# Patient Record
Sex: Female | Born: 1975 | Race: White | Hispanic: No | Marital: Married | Smoking: Former smoker
Health system: Southern US, Community
[De-identification: ages and names within clinical notes are randomized; demographics above are authoritative.]

## PROBLEM LIST (undated history)

## (undated) DIAGNOSIS — F32A Depression, unspecified: Secondary | ICD-10-CM

## (undated) DIAGNOSIS — F419 Anxiety disorder, unspecified: Secondary | ICD-10-CM

## (undated) DIAGNOSIS — Z9889 Other specified postprocedural states: Secondary | ICD-10-CM

## (undated) DIAGNOSIS — R112 Nausea with vomiting, unspecified: Secondary | ICD-10-CM

## (undated) DIAGNOSIS — I10 Essential (primary) hypertension: Secondary | ICD-10-CM

## (undated) DIAGNOSIS — H269 Unspecified cataract: Secondary | ICD-10-CM

## (undated) DIAGNOSIS — E079 Disorder of thyroid, unspecified: Secondary | ICD-10-CM

## (undated) DIAGNOSIS — D649 Anemia, unspecified: Secondary | ICD-10-CM

## (undated) DIAGNOSIS — G4730 Sleep apnea, unspecified: Secondary | ICD-10-CM

## (undated) DIAGNOSIS — Z5189 Encounter for other specified aftercare: Secondary | ICD-10-CM

## (undated) DIAGNOSIS — K219 Gastro-esophageal reflux disease without esophagitis: Secondary | ICD-10-CM

## (undated) DIAGNOSIS — Z8489 Family history of other specified conditions: Secondary | ICD-10-CM

## (undated) DIAGNOSIS — F329 Major depressive disorder, single episode, unspecified: Secondary | ICD-10-CM

## (undated) HISTORY — DX: Anxiety disorder, unspecified: F41.9

## (undated) HISTORY — DX: Essential (primary) hypertension: I10

## (undated) HISTORY — DX: Disorder of thyroid, unspecified: E07.9

## (undated) HISTORY — DX: Encounter for other specified aftercare: Z51.89

## (undated) HISTORY — DX: Unspecified cataract: H26.9

---

## 2000-11-15 ENCOUNTER — Other Ambulatory Visit: Admission: RE | Admit: 2000-11-15 | Discharge: 2000-11-15 | Payer: Self-pay | Admitting: Obstetrics and Gynecology

## 2000-12-16 ENCOUNTER — Emergency Department (HOSPITAL_COMMUNITY): Admission: EM | Admit: 2000-12-16 | Discharge: 2000-12-17 | Payer: Self-pay | Admitting: *Deleted

## 2001-03-20 ENCOUNTER — Ambulatory Visit (HOSPITAL_COMMUNITY): Admission: RE | Admit: 2001-03-20 | Discharge: 2001-03-20 | Payer: Self-pay | Admitting: Internal Medicine

## 2001-03-20 ENCOUNTER — Encounter: Payer: Self-pay | Admitting: Internal Medicine

## 2001-10-21 ENCOUNTER — Emergency Department (HOSPITAL_COMMUNITY): Admission: EM | Admit: 2001-10-21 | Discharge: 2001-10-21 | Payer: Self-pay | Admitting: Internal Medicine

## 2002-05-16 HISTORY — PX: TUBAL LIGATION: SHX77

## 2014-05-28 ENCOUNTER — Telehealth: Payer: Self-pay | Admitting: Family Medicine

## 2014-05-28 NOTE — Telephone Encounter (Signed)
Pt was being seen at health dept but now has [place] and [facility] as a new pt to establish care and get work physical. Pt given appt with Dr.Stacks 06/18/14 @ 10:55 and advised to arrive 15 minutes prior to fill out forms and to bring insurance card and all current meds which the pt states she doesn't have any chronic health problems or take meds on a regular basis.

## 2014-06-18 ENCOUNTER — Encounter: Payer: Self-pay | Admitting: Family Medicine

## 2014-06-18 ENCOUNTER — Ambulatory Visit (INDEPENDENT_AMBULATORY_CARE_PROVIDER_SITE_OTHER): Payer: BC Managed Care – PPO | Admitting: Family Medicine

## 2014-06-18 ENCOUNTER — Encounter (INDEPENDENT_AMBULATORY_CARE_PROVIDER_SITE_OTHER): Payer: Self-pay

## 2014-06-18 VITALS — BP 149/91 | HR 78 | Temp 99.0°F | Ht 66.0 in | Wt 286.0 lb

## 2014-06-18 DIAGNOSIS — Z111 Encounter for screening for respiratory tuberculosis: Secondary | ICD-10-CM

## 2014-06-18 DIAGNOSIS — Z139 Encounter for screening, unspecified: Secondary | ICD-10-CM

## 2014-06-18 DIAGNOSIS — Z23 Encounter for immunization: Secondary | ICD-10-CM

## 2014-06-18 DIAGNOSIS — Z021 Encounter for pre-employment examination: Secondary | ICD-10-CM

## 2014-06-18 NOTE — Patient Instructions (Signed)
DASH Eating Plan °DASH stands for "Dietary Approaches to Stop Hypertension." The DASH eating plan is a healthy eating plan that has been shown to reduce high blood pressure (hypertension). Additional health benefits may include reducing the risk of type 2 diabetes mellitus, heart disease, and stroke. The DASH eating plan may also help with weight loss. °WHAT DO I NEED TO KNOW ABOUT THE DASH EATING PLAN? °For the DASH eating plan, you will follow these general guidelines: °· Choose foods with a percent daily value for sodium of less than 5% (as listed on the food label). °· Use salt-free seasonings or herbs instead of table salt or sea salt. °· Check with your health care provider or pharmacist before using salt substitutes. °· Eat lower-sodium products, often labeled as "lower sodium" or "no salt added." °· Eat fresh foods. °· Eat more vegetables, fruits, and low-fat dairy products. °· Choose whole grains. Look for the word "whole" as the first word in the ingredient list. °· Choose fish and skinless chicken or turkey more often than red meat. Limit fish, poultry, and meat to 6 oz (170 g) each day. °· Limit sweets, desserts, sugars, and sugary drinks. °· Choose heart-healthy fats. °· Limit cheese to 1 oz (28 g) per day. °· Eat more home-cooked food and less restaurant, buffet, and fast food. °· Limit fried foods. °· Cook foods using methods other than frying. °· Limit canned vegetables. If you do use them, rinse them well to decrease the sodium. °· When eating at a restaurant, ask that your food be prepared with less salt, or no salt if possible. °WHAT FOODS CAN I EAT? °Seek help from a dietitian for individual calorie needs. °Grains °Whole grain or whole wheat bread. Brown rice. Whole grain or whole wheat pasta. Quinoa, bulgur, and whole grain cereals. Low-sodium cereals. Corn or whole wheat flour tortillas. Whole grain cornbread. Whole grain crackers. Low-sodium crackers. °Vegetables °Fresh or frozen vegetables  (raw, steamed, roasted, or grilled). Low-sodium or reduced-sodium tomato and vegetable juices. Low-sodium or reduced-sodium tomato sauce and paste. Low-sodium or reduced-sodium canned vegetables.  °Fruits °All fresh, canned (in natural juice), or frozen fruits. °Meat and Other Protein Products °Ground beef (85% or leaner), grass-fed beef, or beef trimmed of fat. Skinless chicken or turkey. Ground chicken or turkey. Pork trimmed of fat. All fish and seafood. Eggs. Dried beans, peas, or lentils. Unsalted nuts and seeds. Unsalted canned beans. °Dairy °Low-fat dairy products, such as skim or 1% milk, 2% or reduced-fat cheeses, low-fat ricotta or cottage cheese, or plain low-fat yogurt. Low-sodium or reduced-sodium cheeses. °Fats and Oils °Tub margarines without trans fats. Light or reduced-fat mayonnaise and salad dressings (reduced sodium). Avocado. Safflower, olive, or canola oils. Natural peanut or almond butter. °Other °Unsalted popcorn and pretzels. °The items listed above may not be a complete list of recommended foods or beverages. Contact your dietitian for more options. °WHAT FOODS ARE NOT RECOMMENDED? °Grains °White bread. White pasta. White rice. Refined cornbread. Bagels and croissants. Crackers that contain trans fat. °Vegetables °Creamed or fried vegetables. Vegetables in a cheese sauce. Regular canned vegetables. Regular canned tomato sauce and paste. Regular tomato and vegetable juices. °Fruits °Dried fruits. Canned fruit in light or heavy syrup. Fruit juice. °Meat and Other Protein Products °Fatty cuts of meat. Ribs, chicken wings, bacon, sausage, bologna, salami, chitterlings, fatback, hot dogs, bratwurst, and packaged luncheon meats. Salted nuts and seeds. Canned beans with salt. °Dairy °Whole or 2% milk, cream, half-and-half, and cream cheese. Whole-fat or sweetened yogurt. Full-fat   cheeses or blue cheese. Nondairy creamers and whipped toppings. Processed cheese, cheese spreads, or cheese  curds. °Condiments °Onion and garlic salt, seasoned salt, table salt, and sea salt. Canned and packaged gravies. Worcestershire sauce. Tartar sauce. Barbecue sauce. Teriyaki sauce. Soy sauce, including reduced sodium. Steak sauce. Fish sauce. Oyster sauce. Cocktail sauce. Horseradish. Ketchup and mustard. Meat flavorings and tenderizers. Bouillon cubes. Hot sauce. Tabasco sauce. Marinades. Taco seasonings. Relishes. °Fats and Oils °Butter, stick margarine, lard, shortening, ghee, and bacon fat. Coconut, palm kernel, or palm oils. Regular salad dressings. °Other °Pickles and olives. Salted popcorn and pretzels. °The items listed above may not be a complete list of foods and beverages to avoid. Contact your dietitian for more information. °WHERE CAN I FIND MORE INFORMATION? °[employer], [employer], [employer]: www.nhlbi.nih.gov/health/health-topics/topics/dash/ °Document Released: 04/21/2011 Document Revised: 09/16/2013 Document Reviewed: 03/06/2013 °Exit[employer]® Patient Information ©2015 [employer]. This information is not intended to replace advice given to you by your health care provider. Make sure you discuss any questions you have with your health care provider. ° °

## 2014-06-18 NOTE — Progress Notes (Signed)
   Subjective:    Patient ID: Heather Hardin, female    DOB: 1976-01-06, 39 y.o.   MRN: [id number]  HPI  Patient is here today to establish care and she also needs a PE for [facility].        Review of Systems  Constitutional: Negative for fever, chills, diaphoresis, appetite change, fatigue and unexpected weight change.  HENT: Negative for congestion, ear pain, hearing loss, postnasal drip, rhinorrhea, sneezing, sore throat and trouble swallowing.   Eyes: Negative for pain.  Respiratory: Negative for cough, chest tightness and shortness of breath.   Cardiovascular: Negative for chest pain and palpitations.  Gastrointestinal: Negative for nausea, vomiting, abdominal pain, diarrhea and constipation.  Genitourinary: Negative for dysuria, frequency and menstrual problem.  Musculoskeletal: Negative for joint swelling and arthralgias.  Skin: Negative for rash.  Neurological: Negative for dizziness, weakness, numbness and headaches.  Psychiatric/Behavioral: Negative for dysphoric mood and agitation.       Objective:   Physical Exam  Constitutional: She is oriented to person, place, and time. She appears well-developed and well-nourished. No distress.  HENT:  Head: Normocephalic and atraumatic.  Right Ear: External ear normal.  Left Ear: External ear normal.  Nose: Nose normal.  Mouth/Throat: Oropharynx is clear and moist.  Eyes: Conjunctivae and EOM are normal. Pupils are equal, round, and reactive to light.  Neck: Normal range of motion. Neck supple. No thyromegaly present.  Cardiovascular: Normal rate, regular rhythm and normal heart sounds.   No murmur heard. Pulmonary/Chest: Effort normal and breath sounds normal. No respiratory distress. She has no wheezes. She has no rales.  Abdominal: Soft. Bowel sounds are normal. She exhibits no distension. There is no tenderness.  Lymphadenopathy:    She has no cervical adenopathy.  Neurological: She is alert and  oriented to person, place, and time. She has normal reflexes.  Skin: Skin is warm and dry.  Psychiatric: She has a normal mood and affect. Her behavior is normal. Judgment and thought content normal.   BP 149/91 mmHg  Pulse 78  Temp(Src) 99 F (37.2 C) (Oral)  Ht 5\' 6"  (1.676 m)  Wt 286 lb (129.729 kg)  BMI 46.18 kg/m2  LMP 05/23/2014        Assessment & Plan:   1. Screening     No orders of the defined types were placed in this encounter.    Orders Placed This Encounter  Procedures  . Tdap vaccine greater than or equal to 7yo IM  . PPD    Order Specific Question:  Has patient ever tested positive?    Answer:  No    Labs pending Health Maintenance reviewed Diet and exercise encouraged Continue all meds as discussed Follow up prn Claretta Fraise, MD

## 2014-06-20 LAB — TB SKIN TEST
Induration: 0 mm
TB Skin Test: NEGATIVE

## 2014-07-18 ENCOUNTER — Other Ambulatory Visit: Payer: BC Managed Care – PPO | Admitting: Family

## 2014-08-22 ENCOUNTER — Ambulatory Visit (INDEPENDENT_AMBULATORY_CARE_PROVIDER_SITE_OTHER): Payer: BC Managed Care – PPO | Admitting: Family

## 2014-08-22 ENCOUNTER — Encounter: Payer: Self-pay | Admitting: Family

## 2014-08-22 VITALS — BP 162/101 | HR 80 | Temp 98.8°F | Ht 66.0 in | Wt 289.0 lb

## 2014-08-22 DIAGNOSIS — Z0000 Encounter for general adult medical examination without abnormal findings: Secondary | ICD-10-CM

## 2014-08-22 DIAGNOSIS — Z01419 Encounter for gynecological examination (general) (routine) without abnormal findings: Secondary | ICD-10-CM

## 2014-08-22 DIAGNOSIS — I10 Essential (primary) hypertension: Secondary | ICD-10-CM

## 2014-08-22 LAB — POCT UA - MICROSCOPIC ONLY
Bacteria, U Microscopic: NEGATIVE
Casts, Ur, LPF, POC: NEGATIVE
Crystals, Ur, HPF, POC: NEGATIVE
Yeast, UA: NEGATIVE

## 2014-08-22 LAB — POCT URINALYSIS DIPSTICK
Bilirubin, UA: NEGATIVE
Glucose, UA: NEGATIVE
Ketones, UA: NEGATIVE
Leukocytes, UA: NEGATIVE
Nitrite, UA: NEGATIVE
Protein, UA: NEGATIVE
Spec Grav, UA: 1.025
Urobilinogen, UA: NEGATIVE
pH, UA: 6

## 2014-08-22 MED ORDER — LISINOPRIL 20 MG PO TABS: 20.0000 mg | ORAL_TABLET | Freq: Every day | ORAL | Status: DC

## 2014-08-22 NOTE — Progress Notes (Signed)
Subjective:    Patient ID: Heather Hardin, female    DOB: 1975-08-13, 39 y.o.   MRN: [id number]  Pt presents to the office today for CPE with pap. Pt's BP is elevated today. Pt states she is under a great deal of stress r/t to family and her husband. Pt currently not taking any for BP. Gynecologic Exam Pertinent negatives include no headaches.  Hypertension This is a new problem. The current episode started today. The problem has been waxing and waning since onset. The problem is uncontrolled. Associated symptoms include peripheral edema (At times). Pertinent negatives include no anxiety, headaches, palpitations or shortness of breath. Risk factors for coronary artery disease include family history and obesity. Past treatments include nothing. The current treatment provides no improvement. There is no history of kidney disease, CAD/MI, CVA, heart failure or a thyroid problem. There is no history of sleep apnea.      Review of Systems  Constitutional: Negative.   HENT: Negative.   Eyes: Negative.   Respiratory: Negative.  Negative for shortness of breath.   Cardiovascular: Negative.  Negative for palpitations.  Gastrointestinal: Negative.   Endocrine: Negative.   Genitourinary: Negative.   Musculoskeletal: Negative.   Neurological: Negative.  Negative for headaches.  Hematological: Negative.   Psychiatric/Behavioral: Negative.   All other systems reviewed and are negative.      Objective:   Physical Exam  Constitutional: She is oriented to person, place, and time. She appears well-developed and well-nourished. No distress.  HENT:  Head: Normocephalic and atraumatic.  Right Ear: External ear normal.  Left Ear: External ear normal.  Nose: Nose normal.  Mouth/Throat: Oropharynx is clear and moist.  Eyes: Pupils are equal, round, and reactive to light.  Neck: Normal range of motion. Neck supple. No thyromegaly present.  Cardiovascular: Normal rate, regular rhythm, normal heart  sounds and intact distal pulses.   No murmur heard. Pulmonary/Chest: Effort normal and breath sounds normal. No respiratory distress. She has no wheezes. Right breast exhibits no inverted nipple, no mass, no nipple discharge, no skin change and no tenderness. Left breast exhibits no inverted nipple, no mass, no nipple discharge, no skin change and no tenderness. Breasts are symmetrical.  Abdominal: Soft. Bowel sounds are normal. She exhibits no distension. There is no tenderness.  Genitourinary: Vagina normal.  Bimanual exam- no adnexal masses or tenderness, ovaries nonpalpable   Cervix parous and pink- No discharge   Musculoskeletal: Normal range of motion. She exhibits no edema or tenderness.  Neurological: She is alert and oriented to person, place, and time. She has normal reflexes. No cranial nerve deficit.  Skin: Skin is warm and dry.  Psychiatric: She has a normal mood and affect. Her behavior is normal. Judgment and thought content normal.  Vitals reviewed.  BP 162/101 mmHg  Pulse 80  Temp(Src) 98.8 F (37.1 C) (Oral)  Ht _0  (1.676 m)  Wt 289 lb (131.09 kg)  BMI 46.67 kg/m2  LMP 08/16/2014        Assessment & Plan:  1. Encounter for routine gynecological examination - POCT UA - Microscopic Only - POCT urinalysis dipstick - Pap IG w/ reflex to HPV when ASC-U  2. Essential hypertension -Pt started on Lisinopril 20 mg today -Dash diet information given -Exercise encouraged - Stress Management  -Continue current meds -RTO in 2 weeks - lisinopril (PRINIVIL,ZESTRIL) 20 MG tablet; Take 1 tablet (20 mg total) by mouth daily.  Dispense: 90 tablet; Refill: 3  3. Annual physical exam - CMP14+EGFR -  Thyroid Panel With TSH - Vit D  25 hydroxy (rtn osteoporosis monitoring)   Continue all meds Labs pending Health Maintenance reviewed Diet and exercise encouraged RTO 2 weeks to recheck HTN and pt to come fasting for lipid test  Evelina Dun, FNP

## 2014-08-22 NOTE — Patient Instructions (Signed)
DASH Eating Plan DASH stands for "Dietary Approaches to Stop Hypertension." The DASH eating plan is a healthy eating plan that has been shown to reduce high blood pressure (hypertension). Additional health benefits may include reducing the risk of type 2 diabetes mellitus, heart disease, and stroke. The DASH eating plan may also help with weight loss. WHAT DO I NEED TO KNOW ABOUT THE DASH EATING PLAN? For the DASH eating plan, you will follow these general guidelines:  Choose foods with a percent daily value for sodium of less than 5% (as listed on the food label).  Use salt-free seasonings or herbs instead of table salt or sea salt.  Check with your health care provider or pharmacist before using salt substitutes.  Eat lower-sodium products, often labeled as "lower sodium" or "no salt added."  Eat fresh foods.  Eat more vegetables, fruits, and low-fat dairy products.  Choose whole grains. Look for the word "whole" as the first word in the ingredient list.  Choose fish and skinless chicken or [place] more often than red meat. Limit fish, poultry, and meat to 6 oz (170 g) each day.  Limit sweets, desserts, sugars, and sugary drinks.  Choose heart-healthy fats.  Limit cheese to 1 oz (28 g) per day.  Eat more home-cooked food and less restaurant, buffet, and fast food.  Limit fried foods.  Cook foods using methods other than frying.  Limit canned vegetables. If you do use them, rinse them well to decrease the sodium.  When eating at a restaurant, ask that your food be prepared with less salt, or no salt if possible. WHAT FOODS CAN I EAT? Seek help from a dietitian for individual calorie needs. Grains Whole grain or whole wheat bread. Brown rice. Whole grain or whole wheat pasta. Quinoa, bulgur, and whole grain cereals. Low-sodium cereals. Corn or whole wheat flour tortillas. Whole grain cornbread. Whole grain crackers. Low-sodium crackers. Vegetables Fresh or frozen vegetables  (raw, steamed, roasted, or grilled). Low-sodium or reduced-sodium tomato and vegetable juices. Low-sodium or reduced-sodium tomato sauce and paste. Low-sodium or reduced-sodium canned vegetables.  Fruits All fresh, canned (in natural juice), or frozen fruits. Meat and Other Protein Products Ground beef (85% or leaner), grass-fed beef, or beef trimmed of fat. Skinless chicken or [place]. Ground chicken or [place]. Pork trimmed of fat. All fish and seafood. Eggs. Dried beans, peas, or lentils. Unsalted nuts and seeds. Unsalted canned beans. Dairy Low-fat dairy products, such as skim or 1% milk, 2% or reduced-fat cheeses, low-fat ricotta or cottage cheese, or plain low-fat yogurt. Low-sodium or reduced-sodium cheeses. Fats and Oils Tub margarines without trans fats. Light or reduced-fat mayonnaise and salad dressings (reduced sodium). Avocado. Safflower, olive, or canola oils. Natural peanut or almond butter. Other Unsalted popcorn and pretzels. The items listed above may not be a complete list of recommended foods or beverages. Contact your dietitian for more options. WHAT FOODS ARE NOT RECOMMENDED? Grains White bread. White pasta. White rice. Refined cornbread. Bagels and croissants. Crackers that contain trans fat. Vegetables Creamed or fried vegetables. Vegetables in a cheese sauce. Regular canned vegetables. Regular canned tomato sauce and paste. Regular tomato and vegetable juices. Fruits Dried fruits. Canned fruit in light or heavy syrup. Fruit juice. Meat and Other Protein Products Fatty cuts of meat. Ribs, chicken wings, bacon, sausage, bologna, salami, chitterlings, fatback, hot dogs, bratwurst, and packaged luncheon meats. Salted nuts and seeds. Canned beans with salt. Dairy Whole or 2% milk, cream, half-and-half, and cream cheese. Whole-fat or sweetened yogurt. Full-fat  cheeses or blue cheese. Nondairy creamers and whipped toppings. Processed cheese, cheese spreads, or cheese  curds. Condiments Onion and garlic salt, seasoned salt, table salt, and sea salt. Canned and packaged gravies. Worcestershire sauce. Tartar sauce. Barbecue sauce. Teriyaki sauce. Soy sauce, including reduced sodium. Steak sauce. Fish sauce. Oyster sauce. Cocktail sauce. Horseradish. Ketchup and mustard. Meat flavorings and tenderizers. Bouillon cubes. Hot sauce. Tabasco sauce. Marinades. Taco seasonings. Relishes. Fats and Oils Butter, stick margarine, lard, shortening, ghee, and bacon fat. Coconut, palm kernel, or palm oils. Regular salad dressings. Other Pickles and olives. Salted popcorn and pretzels. The items listed above may not be a complete list of foods and beverages to avoid. Contact your dietitian for more information. WHERE CAN I FIND MORE INFORMATION? National Heart, Lung, and Blood Institute: travelstabloid.com Document Released: 04/21/2011 Document Revised: 09/16/2013 Document Reviewed: 03/06/2013 [facility] Patient Information 2015 [place], [place]. This information is not intended to replace advice given to you by your health care provider. Make sure you discuss any questions you have with your health care provider. Hypertension Hypertension, commonly called high blood pressure, is when the force of blood pumping through your arteries is too strong. Your arteries are the blood vessels that carry blood from your heart throughout your body. A blood pressure reading consists of a higher number over a lower number, such as 110/72. The higher number (systolic) is the pressure inside your arteries when your heart pumps. The lower number (diastolic) is the pressure inside your arteries when your heart relaxes. Ideally you want your blood pressure below 120/80. Hypertension forces your heart to work harder to pump blood. Your arteries may become narrow or stiff. Having hypertension puts you at risk for heart disease, stroke, and other problems.  RISK  FACTORS Some risk factors for high blood pressure are controllable. Others are not.  Risk factors you cannot control include:   Race. You may be at higher risk if you are African American.  Age. Risk increases with age.  Gender. Men are at higher risk than women before age 37 years. After age 55, women are at higher risk than men. Risk factors you can control include:  Not getting enough exercise or physical activity.  Being overweight.  Getting too much fat, sugar, calories, or salt in your diet.  Drinking too much alcohol. SIGNS AND SYMPTOMS Hypertension does not usually cause signs or symptoms. Extremely high blood pressure (hypertensive crisis) may cause headache, anxiety, shortness of breath, and nosebleed. DIAGNOSIS  To check if you have hypertension, your health care provider will measure your blood pressure while you are seated, with your arm held at the level of your heart. It should be measured at least twice using the same arm. Certain conditions can cause a difference in blood pressure between your right and left arms. A blood pressure reading that is higher than normal on one occasion does not mean that you need treatment. If one blood pressure reading is high, ask your health care provider about having it checked again. TREATMENT  Treating high blood pressure includes making lifestyle changes and possibly taking medicine. Living a healthy lifestyle can help lower high blood pressure. You may need to change some of your habits. Lifestyle changes may include:  Following the DASH diet. This diet is high in fruits, vegetables, and whole grains. It is low in salt, red meat, and added sugars.  Getting at least 2 hours of brisk physical activity every week.  Losing weight if necessary.  Not smoking.  Limiting  alcoholic beverages.  Learning ways to reduce stress. If lifestyle changes are not enough to get your blood pressure under control, your health care provider may  prescribe medicine. You may need to take more than one. Work closely with your health care provider to understand the risks and benefits. HOME CARE INSTRUCTIONS  Have your blood pressure rechecked as directed by your health care provider.   Take medicines only as directed by your health care provider. Follow the directions carefully. Blood pressure medicines must be taken as prescribed. The medicine does not work as well when you skip doses. Skipping doses also puts you at risk for problems.   Do not smoke.   Monitor your blood pressure at home as directed by your health care provider. SEEK MEDICAL CARE IF:   You think you are having a reaction to medicines taken.  You have recurrent headaches or feel dizzy.  You have swelling in your ankles.  You have trouble with your vision. SEEK IMMEDIATE MEDICAL CARE IF:  You develop a severe headache or confusion.  You have unusual weakness, numbness, or feel faint.  You have severe chest or abdominal pain.  You vomit repeatedly.  You have trouble breathing. MAKE SURE YOU:   Understand these instructions.  Will watch your condition.  Will get help right away if you are not doing well or get worse. Document Released: 05/02/2005 Document Revised: 09/16/2013 Document Reviewed: 02/22/2013 [facility] Patient Information 2015 [place], [place]. This information is not intended to replace advice given to you by your health care provider. Make sure you discuss any questions you have with your health care provider. Health Maintenance Adopting a healthy lifestyle and getting preventive care can go a long way to promote health and wellness. Talk with your health care provider about what schedule of regular examinations is right for you. This is a good chance for you to check in with your provider about disease prevention and staying healthy. In between checkups, there are plenty of things you can do on your own. Experts have done a lot of research  about which lifestyle changes and preventive measures are most likely to keep you healthy. Ask your health care provider for more information. WEIGHT AND DIET  Eat a healthy diet  Be sure to include plenty of vegetables, fruits, low-fat dairy products, and lean protein.  Do not eat a lot of foods high in solid fats, added sugars, or salt.  Get regular exercise. This is one of the most important things you can do for your health.  Most adults should exercise for at least 150 minutes each week. The exercise should increase your heart rate and make you sweat (moderate-intensity exercise).  Most adults should also do strengthening exercises at least twice a week. This is in addition to the moderate-intensity exercise.  Maintain a healthy weight  Body mass index (BMI) is a measurement that can be used to identify possible weight problems. It estimates body fat based on height and weight. Your health care provider can help determine your BMI and help you achieve or maintain a healthy weight.  For females 91 years of age and older:   A BMI below 18.5 is considered underweight.  A BMI of 18.5 to 24.9 is normal.  A BMI of 25 to 29.9 is considered overweight.  A BMI of 30 and above is considered obese.  Watch levels of cholesterol and blood lipids  You should start having your blood tested for lipids and cholesterol at 40 years  of age, then have this test every 5 years.  You may need to have your cholesterol levels checked more often if:  Your lipid or cholesterol levels are high.  You are older than 40 years of age.  You are at high risk for heart disease.  CANCER SCREENING   Lung Cancer  Lung cancer screening is recommended for adults 21-10 years old who are at high risk for lung cancer because of a history of smoking.  A yearly low-dose CT scan of the lungs is recommended for people who:  Currently smoke.  Have quit within the past 15 years.  Have at least a  30-pack-year history of smoking. A pack year is smoking an average of one pack of cigarettes a day for 1 year.  Yearly screening should continue until it has been 15 years since you quit.  Yearly screening should stop if you develop a health problem that would prevent you from having lung cancer treatment.  Breast Cancer  Practice breast self-awareness. This means understanding how your breasts normally appear and feel.  It also means doing regular breast self-exams. Let your health care provider know about any changes, no matter how small.  If you are in your 20s or 30s, you should have a clinical breast exam (CBE) by a health care provider every 1-3 years as part of a regular health exam.  If you are 23 or older, have a CBE every year. Also consider having a breast X-ray (mammogram) every year.  If you have a family history of breast cancer, talk to your health care provider about genetic screening.  If you are at high risk for breast cancer, talk to your health care provider about having an MRI and a mammogram every year.  Breast cancer gene (BRCA) assessment is recommended for women who have family members with BRCA-related cancers. BRCA-related cancers include:  Breast.  Ovarian.  Tubal.  Peritoneal cancers.  Results of the assessment will determine the need for genetic counseling and BRCA1 and BRCA2 testing. Cervical Cancer Routine pelvic examinations to screen for cervical cancer are no longer recommended for nonpregnant women who are considered low risk for cancer of the pelvic organs (ovaries, uterus, and vagina) and who do not have symptoms. A pelvic examination may be necessary if you have symptoms including those associated with pelvic infections. Ask your health care provider if a screening pelvic exam is right for you.   The Pap test is the screening test for cervical cancer for women who are considered at risk.  If you had a hysterectomy for a problem that was not  cancer or a condition that could lead to cancer, then you no longer need Pap tests.  If you are older than 65 years, and you have had normal Pap tests for the past 10 years, you no longer need to have Pap tests.  If you have had past treatment for cervical cancer or a condition that could lead to cancer, you need Pap tests and screening for cancer for at least 20 years after your treatment.  If you no longer get a Pap test, assess your risk factors if they change (such as having a new sexual partner). This can affect whether you should start being screened again.  Some women have medical problems that increase their chance of getting cervical cancer. If this is the case for you, your health care provider may recommend more frequent screening and Pap tests.  The human papillomavirus (HPV) test is another test  that may be used for cervical cancer screening. The HPV test looks for the virus that can cause cell changes in the cervix. The cells collected during the Pap test can be tested for HPV.  The HPV test can be used to screen women 41 years of age and older. Getting tested for HPV can extend the interval between normal Pap tests from three to five years.  An HPV test also should be used to screen women of any age who have unclear Pap test results.  After 40 years of age, women should have HPV testing as often as Pap tests.  Colorectal Cancer  This type of cancer can be detected and often prevented.  Routine colorectal cancer screening usually begins at 40 years of age and continues through 40 years of age.  Your health care provider may recommend screening at an earlier age if you have risk factors for colon cancer.  Your health care provider may also recommend using home test kits to check for hidden blood in the stool.  A small camera at the end of a tube can be used to examine your colon directly (sigmoidoscopy or colonoscopy). This is done to check for the earliest forms of  colorectal cancer.  Routine screening usually begins at age 19.  Direct examination of the colon should be repeated every 5-10 years through 40 years of age. However, you may need to be screened more often if early forms of precancerous polyps or small growths are found. Skin Cancer  Check your skin from head to toe regularly.  Tell your health care provider about any new moles or changes in moles, especially if there is a change in a mole's shape or color.  Also tell your health care provider if you have a mole that is larger than the size of a pencil eraser.  Always use sunscreen. Apply sunscreen liberally and repeatedly throughout the day.  Protect yourself by wearing long sleeves, pants, a wide-brimmed hat, and sunglasses whenever you are outside. HEART DISEASE, DIABETES, AND HIGH BLOOD PRESSURE   Have your blood pressure checked at least every 1-2 years. High blood pressure causes heart disease and increases the risk of stroke.  If you are between 43 years and 9 years old, ask your health care provider if you should take aspirin to prevent strokes.  Have regular diabetes screenings. This involves taking a blood sample to check your fasting blood sugar level.  If you are at a normal weight and have a low risk for diabetes, have this test once every three years after 40 years of age.  If you are overweight and have a high risk for diabetes, consider being tested at a younger age or more often. PREVENTING INFECTION  Hepatitis B  If you have a higher risk for hepatitis B, you should be screened for this virus. You are considered at high risk for hepatitis B if:  You were born in a country where hepatitis B is common. Ask your health care provider which countries are considered high risk.  Your parents were born in a high-risk country, and you have not been immunized against hepatitis B (hepatitis B vaccine).  You have HIV or AIDS.  You use needles to inject street  drugs.  You live with someone who has hepatitis B.  You have had sex with someone who has hepatitis B.  You get hemodialysis treatment.  You take certain medicines for conditions, including cancer, organ transplantation, and autoimmune conditions. Hepatitis C  Blood  testing is recommended for:  Everyone born from [birth year] through 1966.  Anyone with known risk factors for hepatitis C. Sexually transmitted infections (STIs)  You should be screened for sexually transmitted infections (STIs) including gonorrhea and chlamydia if:  You are sexually active and are younger than 40 years of age.  You are older than 40 years of age and your health care provider tells you that you are at risk for this type of infection.  Your sexual activity has changed since you were last screened and you are at an increased risk for chlamydia or gonorrhea. Ask your health care provider if you are at risk.  If you do not have HIV, but are at risk, it may be recommended that you take a prescription medicine daily to prevent HIV infection. This is called pre-exposure prophylaxis (PrEP). You are considered at risk if:  You are sexually active and do not regularly use condoms or know the HIV status of your partner(s).  You take drugs by injection.  You are sexually active with a partner who has HIV. Talk with your health care provider about whether you are at high risk of being infected with HIV. If you choose to begin PrEP, you should first be tested for HIV. You should then be tested every 3 months for as long as you are taking PrEP.  PREGNANCY   If you are premenopausal and you may become pregnant, ask your health care provider about preconception counseling.  If you may become pregnant, take 400 to 800 micrograms (mcg) of folic acid every day.  If you want to prevent pregnancy, talk to your health care provider about birth control (contraception). OSTEOPOROSIS AND MENOPAUSE   Osteoporosis is a disease in  which the bones lose minerals and strength with aging. This can result in serious bone fractures. Your risk for osteoporosis can be identified using a bone density scan.  If you are 51 years of age or older, or if you are at risk for osteoporosis and fractures, ask your health care provider if you should be screened.  Ask your health care provider whether you should take a calcium or vitamin D supplement to lower your risk for osteoporosis.  Menopause may have certain physical symptoms and risks.  Hormone replacement therapy may reduce some of these symptoms and risks. Talk to your health care provider about whether hormone replacement therapy is right for you.  HOME CARE INSTRUCTIONS   Schedule regular health, dental, and eye exams.  Stay current with your immunizations.   Do not use any tobacco products including cigarettes, chewing tobacco, or electronic cigarettes.  If you are pregnant, do not drink alcohol.  If you are breastfeeding, limit how much and how often you drink alcohol.  Limit alcohol intake to no more than 1 drink per day for nonpregnant women. One drink equals 12 ounces of beer, 5 ounces of wine, or 1 ounces of hard liquor.  Do not use street drugs.  Do not share needles.  Ask your health care provider for help if you need support or information about quitting drugs.  Tell your health care provider if you often feel depressed.  Tell your health care provider if you have ever been abused or do not feel safe at home. Document Released: 11/15/2010 Document Revised: 09/16/2013 Document Reviewed: 04/03/2013 [facility] Patient Information 2015 [place], [place]. This information is not intended to replace advice given to you by your health care provider. Make sure you discuss any questions you have with  your health care provider.

## 2014-08-23 LAB — CMP14+EGFR
ALT: 31 [IU]/L (ref 0–32)
AST: 20 [IU]/L (ref 0–40)
Albumin/Globulin Ratio: 1.6 (ref 1.1–2.5)
Albumin: 4.2 g/dL (ref 3.5–5.5)
Alkaline Phosphatase: 75 [IU]/L (ref 39–117)
BUN/Creatinine Ratio: 13 (ref 9–23)
BUN: 12 mg/dL (ref 6–24)
Bilirubin Total: 0.2 mg/dL (ref 0.0–1.2)
CO2: 27 mmol/L (ref 18–29)
Calcium: 9.3 mg/dL (ref 8.7–10.2)
Chloride: 101 mmol/L (ref 97–108)
Creatinine, Ser: 0.91 mg/dL (ref 0.57–1.00)
GFR calc Af Amer: 91 mL/min/{1.73_m2} (ref >59)
GFR calc non Af Amer: 79 mL/min/{1.73_m2} (ref >59)
Globulin, Total: 2.6 g/dL (ref 1.5–4.5)
Glucose: 87 mg/dL (ref 65–99)
Potassium: 3.7 mmol/L (ref 3.5–5.2)
Sodium: 141 mmol/L (ref 134–144)
Total Protein: 6.8 g/dL (ref 6.0–8.5)

## 2014-08-23 LAB — VITAMIN D 25 HYDROXY (VIT D DEFICIENCY, FRACTURES): Vit D, 25-Hydroxy: 17.6 ng/mL — ABNORMAL LOW (ref 30.0–100.0)

## 2014-08-23 LAB — THYROID PANEL WITH TSH
Free Thyroxine Index: 2.2 (ref 1.2–4.9)
T3 Uptake Ratio: 27 % (ref 24–39)
T4, Total: 8.3 ug/dL (ref 4.5–12.0)
TSH: 4.71 u[IU]/mL — ABNORMAL HIGH (ref 0.450–4.500)

## 2014-08-25 ENCOUNTER — Other Ambulatory Visit: Payer: Self-pay | Admitting: Family

## 2014-08-25 DIAGNOSIS — E039 Hypothyroidism, unspecified: Secondary | ICD-10-CM | POA: Insufficient documentation

## 2014-08-25 DIAGNOSIS — E559 Vitamin D deficiency, unspecified: Secondary | ICD-10-CM | POA: Insufficient documentation

## 2014-08-25 LAB — PAP IG W/ RFLX HPV ASCU: PAP Smear Comment: 0

## 2014-08-25 MED ORDER — LEVOTHYROXINE SODIUM 50 MCG PO TABS: 50.0000 ug | ORAL_TABLET | Freq: Every day | ORAL | Status: DC

## 2014-08-25 MED ORDER — VITAMIN D (ERGOCALCIFEROL) 1.25 MG (50000 UNIT) PO CAPS: 50000.0000 [IU] | ORAL_CAPSULE | ORAL | Status: DC

## 2014-09-08 ENCOUNTER — Encounter: Payer: Self-pay | Admitting: Family

## 2014-09-08 ENCOUNTER — Ambulatory Visit (INDEPENDENT_AMBULATORY_CARE_PROVIDER_SITE_OTHER): Payer: BC Managed Care – PPO | Admitting: Family

## 2014-09-08 VITALS — BP 121/87 | HR 76 | Temp 99.0°F | Ht 66.0 in | Wt 292.0 lb

## 2014-09-08 DIAGNOSIS — I10 Essential (primary) hypertension: Secondary | ICD-10-CM

## 2014-09-08 DIAGNOSIS — Z13220 Encounter for screening for lipoid disorders: Secondary | ICD-10-CM

## 2014-09-08 NOTE — Progress Notes (Signed)
   Subjective:    Patient ID: Heather Hardin, female    DOB: [birth year], 39 y.o.   MRN: [id number]  Hypertension This is a chronic problem. The current episode started more than 1 month ago. The problem has been resolved since onset. The problem is controlled. Associated symptoms include anxiety and peripheral edema ("at times"). Pertinent negatives include no headaches, palpitations or shortness of breath. Risk factors for coronary artery disease include family history, obesity and sedentary lifestyle. Past treatments include ACE inhibitors. The current treatment provides moderate improvement. Hypertensive end-organ damage includes a thyroid problem. There is no history of kidney disease, CAD/MI, CVA or heart failure. There is no history of sleep apnea.      Review of Systems  Constitutional: Negative.   HENT: Negative.   Eyes: Negative.   Respiratory: Negative.  Negative for shortness of breath.   Cardiovascular: Negative.  Negative for palpitations.  Gastrointestinal: Negative.   Endocrine: Negative.   Genitourinary: Negative.   Musculoskeletal: Negative.   Neurological: Negative.  Negative for headaches.  Hematological: Negative.   Psychiatric/Behavioral: Negative.   All other systems reviewed and are negative.      Objective:   Physical Exam  Constitutional: She is oriented to person, place, and time. She appears well-developed and well-nourished. No distress.  HENT:  Head: Normocephalic and atraumatic.  Right Ear: External ear normal.  Left Ear: External ear normal.  Nose: Nose normal.  Mouth/Throat: Oropharynx is clear and moist.  Eyes: Pupils are equal, round, and reactive to light.  Neck: Normal range of motion. Neck supple. No thyromegaly present.  Cardiovascular: Normal rate, regular rhythm, normal heart sounds and intact distal pulses.   No murmur heard. Pulmonary/Chest: Effort normal and breath sounds normal. No respiratory distress. She has no wheezes.    Abdominal: Soft. Bowel sounds are normal. She exhibits no distension. There is no tenderness.  Musculoskeletal: Normal range of motion. She exhibits no edema or tenderness.  Neurological: She is alert and oriented to person, place, and time. She has normal reflexes. No cranial nerve deficit.  Skin: Skin is warm and dry.  Psychiatric: She has a normal mood and affect. Her behavior is normal. Judgment and thought content normal.  Vitals reviewed.   BP 121/87 mmHg  Pulse 76  Temp(Src) 99 F (37.2 C) (Oral)  Ht $R'5\' 6"'OL$  (1.676 m)  Wt 292 lb (132.45 kg)  BMI 47.15 kg/m2  LMP 08/16/2014       Assessment & Plan:  1. Essential hypertension - BMP8+EGFR  2. Screening cholesterol level - Lipid panel   Continue all meds Labs pending Health Maintenance reviewed Diet and exercise encouraged RTO 6 months  Evelina Dun, FNP

## 2014-09-08 NOTE — Patient Instructions (Signed)
DASH Eating Plan DASH stands for "Dietary Approaches to Stop Hypertension." The DASH eating plan is a healthy eating plan that has been shown to reduce high blood pressure (hypertension). Additional health benefits may include reducing the risk of type 2 diabetes mellitus, heart disease, and stroke. The DASH eating plan may also help with weight loss. WHAT DO I NEED TO KNOW ABOUT THE DASH EATING PLAN? For the DASH eating plan, you will follow these general guidelines:  Choose foods with a percent daily value for sodium of less than 5% (as listed on the food label).  Use salt-free seasonings or herbs instead of table salt or sea salt.  Check with your health care provider or pharmacist before using salt substitutes.  Eat lower-sodium products, often labeled as "lower sodium" or "no salt added."  Eat fresh foods.  Eat more vegetables, fruits, and low-fat dairy products.  Choose whole grains. Look for the word "whole" as the first word in the ingredient list.  Choose fish and skinless chicken or turkey more often than red meat. Limit fish, poultry, and meat to 6 oz (170 g) each day.  Limit sweets, desserts, sugars, and sugary drinks.  Choose heart-healthy fats.  Limit cheese to 1 oz (28 g) per day.  Eat more home-cooked food and less restaurant, buffet, and fast food.  Limit fried foods.  Cook foods using methods other than frying.  Limit canned vegetables. If you do use them, rinse them well to decrease the sodium.  When eating at a restaurant, ask that your food be prepared with less salt, or no salt if possible. WHAT FOODS CAN I EAT? Seek help from a dietitian for individual calorie needs. Grains Whole grain or whole wheat bread. Brown rice. Whole grain or whole wheat pasta. Quinoa, bulgur, and whole grain cereals. Low-sodium cereals. Corn or whole wheat flour tortillas. Whole grain cornbread. Whole grain crackers. Low-sodium crackers. Vegetables Fresh or frozen vegetables  (raw, steamed, roasted, or grilled). Low-sodium or reduced-sodium tomato and vegetable juices. Low-sodium or reduced-sodium tomato sauce and paste. Low-sodium or reduced-sodium canned vegetables.  Fruits All fresh, canned (in natural juice), or frozen fruits. Meat and Other Protein Products Ground beef (85% or leaner), grass-fed beef, or beef trimmed of fat. Skinless chicken or turkey. Ground chicken or turkey. Pork trimmed of fat. All fish and seafood. Eggs. Dried beans, peas, or lentils. Unsalted nuts and seeds. Unsalted canned beans. Dairy Low-fat dairy products, such as skim or 1% milk, 2% or reduced-fat cheeses, low-fat ricotta or cottage cheese, or plain low-fat yogurt. Low-sodium or reduced-sodium cheeses. Fats and Oils Tub margarines without trans fats. Light or reduced-fat mayonnaise and salad dressings (reduced sodium). Avocado. Safflower, olive, or canola oils. Natural peanut or almond butter. Other Unsalted popcorn and pretzels. The items listed above may not be a complete list of recommended foods or beverages. Contact your dietitian for more options. WHAT FOODS ARE NOT RECOMMENDED? Grains White bread. White pasta. White rice. Refined cornbread. Bagels and croissants. Crackers that contain trans fat. Vegetables Creamed or fried vegetables. Vegetables in a cheese sauce. Regular canned vegetables. Regular canned tomato sauce and paste. Regular tomato and vegetable juices. Fruits Dried fruits. Canned fruit in light or heavy syrup. Fruit juice. Meat and Other Protein Products Fatty cuts of meat. Ribs, chicken wings, bacon, sausage, bologna, salami, chitterlings, fatback, hot dogs, bratwurst, and packaged luncheon meats. Salted nuts and seeds. Canned beans with salt. Dairy Whole or 2% milk, cream, half-and-half, and cream cheese. Whole-fat or sweetened yogurt. Full-fat   cheeses or blue cheese. Nondairy creamers and whipped toppings. Processed cheese, cheese spreads, or cheese  curds. Condiments Onion and garlic salt, seasoned salt, table salt, and sea salt. Canned and packaged gravies. Worcestershire sauce. Tartar sauce. Barbecue sauce. Teriyaki sauce. Soy sauce, including reduced sodium. Steak sauce. Fish sauce. Oyster sauce. Cocktail sauce. Horseradish. Ketchup and mustard. Meat flavorings and tenderizers. Bouillon cubes. Hot sauce. Tabasco sauce. Marinades. Taco seasonings. Relishes. Fats and Oils Butter, stick margarine, lard, shortening, ghee, and bacon fat. Coconut, palm kernel, or palm oils. Regular salad dressings. Other Pickles and olives. Salted popcorn and pretzels. The items listed above may not be a complete list of foods and beverages to avoid. Contact your dietitian for more information. WHERE CAN I FIND MORE INFORMATION? [employer], [employer], [employer]: www.nhlbi.nih.gov/health/health-topics/topics/dash/ Document Released: 04/21/2011 Document Revised: 09/16/2013 Document Reviewed: 03/06/2013 Exit[employer] Patient Information 2015 [employer]. This information is not intended to replace advice given to you by your health care provider. Make sure you discuss any questions you have with your health care provider. Hypertension Hypertension, commonly called high blood pressure, is when the force of blood pumping through your arteries is too strong. Your arteries are the blood vessels that carry blood from your heart throughout your body. A blood pressure reading consists of a higher number over a lower number, such as 110/72. The higher number (systolic) is the pressure inside your arteries when your heart pumps. The lower number (diastolic) is the pressure inside your arteries when your heart relaxes. Ideally you want your blood pressure below 120/80. Hypertension forces your heart to work harder to pump blood. Your arteries may become narrow or stiff. Having hypertension puts you at risk for heart disease, stroke, and other problems.  RISK  FACTORS Some risk factors for high blood pressure are controllable. Others are not.  Risk factors you cannot control include:   Race. You may be at higher risk if you are African American.  Age. Risk increases with age.  Gender. Men are at higher risk than women before age 45 years. After age 65, women are at higher risk than men. Risk factors you can control include:  Not getting enough exercise or physical activity.  Being overweight.  Getting too much fat, sugar, calories, or salt in your diet.  Drinking too much alcohol. SIGNS AND SYMPTOMS Hypertension does not usually cause signs or symptoms. Extremely high blood pressure (hypertensive crisis) may cause headache, anxiety, shortness of breath, and nosebleed. DIAGNOSIS  To check if you have hypertension, your health care provider will measure your blood pressure while you are seated, with your arm held at the level of your heart. It should be measured at least twice using the same arm. Certain conditions can cause a difference in blood pressure between your right and left arms. A blood pressure reading that is higher than normal on one occasion does not mean that you need treatment. If one blood pressure reading is high, ask your health care provider about having it checked again. TREATMENT  Treating high blood pressure includes making lifestyle changes and possibly taking medicine. Living a healthy lifestyle can help lower high blood pressure. You may need to change some of your habits. Lifestyle changes may include:  Following the DASH diet. This diet is high in fruits, vegetables, and whole grains. It is low in salt, red meat, and added sugars.  Getting at least 2 hours of brisk physical activity every week.  Losing weight if necessary.  Not smoking.  Limiting   alcoholic beverages.  Learning ways to reduce stress. If lifestyle changes are not enough to get your blood pressure under control, your health care provider may  prescribe medicine. You may need to take more than one. Work closely with your health care provider to understand the risks and benefits. HOME CARE INSTRUCTIONS  Have your blood pressure rechecked as directed by your health care provider.   Take medicines only as directed by your health care provider. Follow the directions carefully. Blood pressure medicines must be taken as prescribed. The medicine does not work as well when you skip doses. Skipping doses also puts you at risk for problems.   Do not smoke.   Monitor your blood pressure at home as directed by your health care provider. SEEK MEDICAL CARE IF:   You think you are having a reaction to medicines taken.  You have recurrent headaches or feel dizzy.  You have swelling in your ankles.  You have trouble with your vision. SEEK IMMEDIATE MEDICAL CARE IF:  You develop a severe headache or confusion.  You have unusual weakness, numbness, or feel faint.  You have severe chest or abdominal pain.  You vomit repeatedly.  You have trouble breathing. MAKE SURE YOU:   Understand these instructions.  Will watch your condition.  Will get help right away if you are not doing well or get worse. Document Released: 05/02/2005 Document Revised: 09/16/2013 Document Reviewed: 02/22/2013 Exit[employer] Patient Information 2015 [employer]. This information is not intended to replace advice given to you by your health care provider. Make sure you discuss any questions you have with your health care provider.  

## 2014-09-09 ENCOUNTER — Telehealth: Payer: Self-pay | Admitting: *Deleted

## 2014-09-09 LAB — BMP8+EGFR
BUN/Creatinine Ratio: 15 (ref 9–23)
BUN: 12 mg/dL (ref 6–24)
CO2: 23 mmol/L (ref 18–29)
Calcium: 9.2 mg/dL (ref 8.7–10.2)
Chloride: 103 mmol/L (ref 97–108)
Creatinine, Ser: 0.82 mg/dL (ref 0.57–1.00)
GFR calc Af Amer: 104 mL/min/{1.73_m2} (ref >59)
GFR calc non Af Amer: 90 mL/min/{1.73_m2} (ref >59)
Glucose: 83 mg/dL (ref 65–99)
Potassium: 4.2 mmol/L (ref 3.5–5.2)
Sodium: 140 mmol/L (ref 134–144)

## 2014-09-09 LAB — LIPID PANEL
Chol/HDL Ratio: 5.6 {ratio} — ABNORMAL HIGH (ref 0.0–4.4)
Cholesterol, Total: 179 mg/dL (ref 100–199)
HDL: 32 mg/dL — ABNORMAL LOW (ref >39)
LDL Calculated: 77 mg/dL (ref 0–99)
Triglycerides: 351 mg/dL — ABNORMAL HIGH (ref 0–149)
VLDL Cholesterol Cal: 70 mg/dL — ABNORMAL HIGH (ref 5–40)

## 2014-09-09 NOTE — Telephone Encounter (Signed)
-----   Message from Sharion Balloon, [place] sent at 09/09/2014  8:32 AM EDT ----- Kidney  function stable LDL WNL HDL not high enough- Pt would benefit from daily fish oil Triglycerides elevated- Pt needs to be on low fat diet

## 2014-09-09 NOTE — Progress Notes (Signed)
Patient aware.

## 2014-10-03 ENCOUNTER — Encounter: Payer: Self-pay | Admitting: Family

## 2014-10-20 ENCOUNTER — Other Ambulatory Visit: Payer: BC Managed Care – PPO

## 2014-10-30 ENCOUNTER — Other Ambulatory Visit: Payer: BC Managed Care – PPO

## 2014-10-30 DIAGNOSIS — R799 Abnormal finding of blood chemistry, unspecified: Secondary | ICD-10-CM

## 2014-10-31 LAB — THYROID PANEL WITH TSH
Free Thyroxine Index: 2.1 (ref 1.2–4.9)
T3 Uptake Ratio: 26 % (ref 24–39)
T4, Total: 8.1 ug/dL (ref 4.5–12.0)
TSH: 3.44 u[IU]/mL (ref 0.450–4.500)

## 2014-12-12 ENCOUNTER — Ambulatory Visit (INDEPENDENT_AMBULATORY_CARE_PROVIDER_SITE_OTHER): Payer: BC Managed Care – PPO | Admitting: Family

## 2014-12-12 ENCOUNTER — Encounter: Payer: Self-pay | Admitting: Family

## 2014-12-12 VITALS — BP 123/87 | HR 88 | Temp 98.4°F | Ht 66.0 in | Wt 300.6 lb

## 2014-12-12 DIAGNOSIS — Z713 Dietary counseling and surveillance: Secondary | ICD-10-CM

## 2014-12-12 DIAGNOSIS — B070 Plantar wart: Secondary | ICD-10-CM

## 2014-12-12 MED ORDER — PHENTERMINE HCL 37.5 MG PO CAPS: 37.5000 mg | ORAL_CAPSULE | ORAL | Status: DC

## 2014-12-12 NOTE — Progress Notes (Signed)
   Subjective:    Patient ID: Heather Hardin, female    DOB: [birth year], 39 y.o.   MRN: [id number]  HPI Pt presents to the office today for a plantar wart on her right heel . Pt states she noticed it about a year ago and states she has tried every "OTC and home remedies" possible with no relief.  PT would like to discuss weigh loss options. Pt states she has tried "cutting back" and increasing her activity, but has gained 8 lbs since her last visit. Pt states this is the heaviest she has ever been and just feels "discouraged".   Review of Systems  Constitutional: Negative.   HENT: Negative.   Eyes: Negative.   Respiratory: Negative.  Negative for shortness of breath.   Cardiovascular: Negative.  Negative for palpitations.  Gastrointestinal: Negative.   Endocrine: Negative.   Genitourinary: Negative.   Musculoskeletal: Negative.   Neurological: Negative.  Negative for headaches.  Hematological: Negative.   Psychiatric/Behavioral: Negative.   All other systems reviewed and are negative.      Objective:   Physical Exam  Constitutional: She is oriented to person, place, and time. She appears well-developed and well-nourished. No distress.  Eyes: Pupils are equal, round, and reactive to light.  Neck: Normal range of motion. Neck supple. No thyromegaly present.  Cardiovascular: Normal rate, regular rhythm, normal heart sounds and intact distal pulses.   No murmur heard. Pulmonary/Chest: Effort normal and breath sounds normal. No respiratory distress. She has no wheezes.  Abdominal: Soft. Bowel sounds are normal. She exhibits no distension. There is no tenderness.  Musculoskeletal: Normal range of motion. She exhibits no edema or tenderness.  Neurological: She is alert and oriented to person, place, and time. She has normal reflexes. No cranial nerve deficit.  Skin: Skin is warm and dry.  Psychiatric: She has a normal mood and affect. Her behavior is normal. Judgment and thought content  normal.  Vitals reviewed.   BP 123/87 mmHg  Pulse 88  Temp(Src) 98.4 F (36.9 C) (Oral)  Ht $R'5\' 6"'wz$  (1.676 m)  Wt 300 lb 9.6 oz (136.351 kg)  BMI 48.54 kg/m2  LMP 12/11/2014  Cryotherapy to right heel     Assessment & Plan:  1. Plantar wart of right foot -Do not pick or squeeze -Keep clean and dry  - CMP14+EGFR  2. Encounter for weight loss counseling -Encourage exercise and low calorie -Diet discussed -RTO 3 months- Pt needs to lose >5% of weight  - phentermine 37.5 MG capsule; Take 1 capsule (37.5 mg total) by mouth every morning.  Dispense: 90 capsule; Refill: 0 - [place], FNP

## 2014-12-12 NOTE — Patient Instructions (Addendum)
Plantar Warts Warts are benign (noncancerous) growths of the outer skin layer. They can occur at any time in life but are most common during childhood and the teen years. Warts can occur on many skin surfaces of the body. When they occur on the underside (sole) of your foot they are called plantar warts. They often emerge in groups with several small warts encircling a larger growth. CAUSES  Human papillomavirus (HPV) is the cause of plantar warts. HPV attacks a break in the skin of the foot. Walking barefoot can lead to exposure to the wart virus. Plantar warts tend to develop over areas of pressure such as the heel and ball of the foot. Plantar warts often grow into the deeper layers of skin. They may spread to other areas of the sole but cannot spread to other areas of the body. SYMPTOMS  You may also notice a growth on the undersurface of your foot. The wart may grow directly into the sole of the foot, or rise above the surface of the skin on the sole of the foot, or both. They are most often flat from pressure. Warts generally do not cause itching but may cause pain in the area of the wart when you put weight on your foot. DIAGNOSIS  Diagnosis is made by physical examination. This means your caregiver discovers it while examining your foot.  TREATMENT  There are many ways to treat plantar warts. However, warts are very tough. Sometimes it is difficult to treat them so that they go away completely and do not grow back. Any treatment must be done regularly to work. If left untreated, most plantar warts will eventually disappear over a period of one to two years. Treatments you can do at home include:  Putting duct tape over the top of the wart (occlusion) has been found to be effective over several months. The duct tape should be removed each night and reapplied until the wart has disappeared.  Placing over-the-counter medications on top of the wart to help kill the wart virus and remove the wart  tissue (salicylic acid, cantharidin, and dichloroacetic acid) are useful. These are called keratolytic agents. These medications make the skin soft and gradually layers will shed away. These compounds are usually placed on the wart each night and then covered with a bandage. They are also available in premedicated bandage form. Avoid surrounding skin when applying these liquids as these medications can burn healthy skin. The treatment may take several months of nightly use to be effective.  Cryotherapy to freeze the wart has recently become available over-the-counter for children 4 years and older. This system makes use of a soft narrow applicator connected to a bottle of compressed cold liquid that is applied directly to the wart. This medication can burn healthy skin and should be used with caution.  As with all over-the-counter medications, read the directions carefully before use. Treatments generally done in your caregiver's office include:  Some aggressive treatments may cause discomfort, discoloration, and scarring of the surrounding skin. The risks and benefits of treatment should be discussed with your caregiver.  Freezing the wart with liquid nitrogen (cryotherapy, see above).  Burning the wart with use of very high heat (cautery).  Injecting medication into the wart.  Surgically removing or laser treatment of the wart.  Your caregiver may refer you to a dermatologist for difficult to treat large-sized warts or large numbers of warts. HOME CARE INSTRUCTIONS   Soak the affected area in warm water. Dry the   area completely when you are done. Remove the top layer of softened skin, then apply the chosen topical medication and reapply a bandage.  Remove the bandage daily and file excess wart tissue (pumice stone works well for this purpose). Repeat the entire process daily or every other day for weeks until the plantar wart disappears.  Several brands of salicylic acid pads are available  as over-the-counter remedies.  Pain can be relieved by wearing a donut bandage. This is a bandage with a hole in it. The bandage is put on with the hole over the wart. This helps take the pressure off the wart and gives pain relief. To help prevent plantar warts:  Wear shoes and socks and change them daily.  Keep feet clean and dry.  Check your feet and your children's feet regularly.  Avoid direct contact with warts on other people.  Have growths or changes on your skin checked by your caregiver. Document Released: 07/23/2003 Document Revised: 09/16/2013 Document Reviewed: 12/31/2008 [facility] Patient Information 2015 [place], [place]. This information is not intended to replace advice given to you by your health care provider. Make sure you discuss any questions you have with your health care provider. Exercise to Lose Weight Exercise and a healthy diet may help you lose weight. Your doctor may suggest specific exercises. EXERCISE IDEAS AND TIPS  Choose low-cost things you enjoy doing, such as walking, bicycling, or exercising to workout videos.  Take stairs instead of the elevator.  Walk during your lunch break.  Park your car further away from work or school.  Go to a gym or an exercise class.  Start with 5 to 10 minutes of exercise each day. Build up to 30 minutes of exercise 4 to 6 days a week.  Wear shoes with good support and comfortable clothes.  Stretch before and after working out.  Work out until you breathe harder and your heart beats faster.  Drink extra water when you exercise.  Do not do so much that you hurt yourself, feel dizzy, or get very short of breath. Exercises that burn about 150 calories:  Running 1  miles in 15 minutes.  Playing volleyball for 45 to 60 minutes.  Washing and waxing a car for 45 to 60 minutes.  Playing touch football for 45 minutes.  Walking 1  miles in 35 minutes.  Pushing a stroller 1  miles in 30 minutes.  Playing  basketball for 30 minutes.  Raking leaves for 30 minutes.  Bicycling 5 miles in 30 minutes.  Walking 2 miles in 30 minutes.  Dancing for 30 minutes.  Shoveling snow for 15 minutes.  Swimming laps for 20 minutes.  Walking up stairs for 15 minutes.  Bicycling 4 miles in 15 minutes.  Gardening for 30 to 45 minutes.  Jumping rope for 15 minutes.  Washing windows or floors for 45 to 60 minutes. Document Released: 06/04/2010 Document Revised: 07/25/2011 Document Reviewed: 06/04/2010 [facility] Patient Information 2015 [place], [place]. This information is not intended to replace advice given to you by your health care provider. Make sure you discuss any questions you have with your health care provider. Calorie Counting for Weight Loss Calories are energy you get from the things you eat and drink. Your body uses this energy to keep you going throughout the day. The number of calories you eat affects your weight. When you eat more calories than your body needs, your body stores the extra calories as fat. When you eat fewer calories than your body  needs, your body burns fat to get the energy it needs. Calorie counting means keeping track of how many calories you eat and drink each day. If you make sure to eat fewer calories than your body needs, you should lose weight. In order for calorie counting to work, you will need to eat the number of calories that are right for you in a day to lose a healthy amount of weight per week. A healthy amount of weight to lose per week is usually 1-2 lb (0.5-0.9 kg). A dietitian can determine how many calories you need in a day and give you suggestions on how to reach your calorie goal.  WHAT IS MY MY PLAN? My goal is to have __________ calories per day.  If I have this many calories per day, I should lose around __________ pounds per week. WHAT DO I NEED TO KNOW ABOUT CALORIE COUNTING? In order to meet your daily calorie goal, you will need to:  Find out how  many calories are in each food you would like to eat. Try to do this before you eat.  Decide how much of the food you can eat.  Write down what you ate and how many calories it had. Doing this is called keeping a food log. WHERE DO I FIND CALORIE INFORMATION? The number of calories in a food can be found on a Nutrition Facts label. Note that all the information on a label is based on a specific serving of the food. If a food does not have a Nutrition Facts label, try to look up the calories online or ask your dietitian for help. HOW DO I DECIDE HOW MUCH TO EAT? To decide how much of the food you can eat, you will need to consider both the number of calories in one serving and the size of one serving. This information can be found on the Nutrition Facts label. If a food does not have a Nutrition Facts label, look up the information online or ask your dietitian for help. Remember that calories are listed per serving. If you choose to have more than one serving of a food, you will have to multiply the calories per serving by the amount of servings you plan to eat. For example, the label on a package of bread might say that a serving size is 1 slice and that there are 90 calories in a serving. If you eat 1 slice, you will have eaten 90 calories. If you eat 2 slices, you will have eaten 180 calories. HOW DO I KEEP A FOOD LOG? After each meal, record the following information in your food log:  What you ate.  How much of it you ate.  How many calories it had.  Then, add up your calories. Keep your food log near you, such as in a small notebook in your pocket. Another option is to use a mobile app or website. Some programs will calculate calories for you and show you how many calories you have left each time you add an item to the log. WHAT ARE SOME CALORIE COUNTING TIPS?  Use your calories on foods and drinks that will fill you up and not leave you hungry. Some examples of this include foods like  nuts and nut butters, vegetables, lean proteins, and high-fiber foods (more than 5 g fiber per serving).  Eat nutritious foods and avoid empty calories. Empty calories are calories you get from foods or beverages that do not have many nutrients, such as  candy and soda. It is better to have a nutritious high-calorie food (such as an avocado) than a food with few nutrients (such as a bag of chips).  Know how many calories are in the foods you eat most often. This way, you do not have to look up how many calories they have each time you eat them.  Look out for foods that may seem like low-calorie foods but are really high-calorie foods, such as baked goods, soda, and fat-free candy.  Pay attention to calories in drinks. Drinks such as sodas, specialty coffee drinks, alcohol, and juices have a lot of calories yet do not fill you up. Choose low-calorie drinks like water and diet drinks.  Focus your calorie counting efforts on higher calorie items. Logging the calories in a garden salad that contains only vegetables is less important than calculating the calories in a milk shake.  Find a way of tracking calories that works for you. Get creative. Most people who are successful find ways to keep track of how much they eat in a day, even if they do not count every calorie. WHAT ARE SOME PORTION CONTROL TIPS?  Know how many calories are in a serving. This will help you know how many servings of a certain food you can have.  Use a measuring cup to measure serving sizes. This is helpful when you start out. With time, you will be able to estimate serving sizes for some foods.  Take some time to put servings of different foods on your favorite plates, bowls, and cups so you know what a serving looks like.  Try not to eat straight from a bag or box. Doing this can lead to overeating. Put the amount you would like to eat in a cup or on a plate to make sure you are eating the right portion.  Use smaller  plates, glasses, and bowls to prevent overeating. This is a quick and easy way to practice portion control. If your plate is smaller, less food can fit on it.  Try not to multitask while eating, such as watching TV or using your computer. If it is time to eat, sit down at a table and enjoy your food. Doing this will help you to start recognizing when you are full. It will also make you more aware of what and how much you are eating. HOW CAN I CALORIE COUNT WHEN EATING OUT?  Ask for smaller portion sizes or child-sized portions.  Consider sharing an entree and sides instead of getting your own entree.  If you get your own entree, eat only half. Ask for a box at the beginning of your meal and put the rest of your entree in it so you are not tempted to eat it.  Look for the calories on the menu. If calories are listed, choose the lower calorie options.  Choose dishes that include vegetables, fruits, whole grains, low-fat dairy products, and lean protein. Focusing on smart food choices from each of the 5 food groups can help you stay on track at restaurants.  Choose items that are boiled, broiled, grilled, or steamed.  Choose water, milk, unsweetened iced tea, or other drinks without added sugars. If you want an alcoholic beverage, choose a lower calorie option. For example, a regular margarita can have up to 700 calories and a glass of wine has around 150.  Stay away from items that are buttered, battered, fried, or served with cream sauce. Items labeled "crispy" are usually fried, unless  stated otherwise.  Ask for dressings, sauces, and syrups on the side. These are usually very high in calories, so do not eat much of them.  Watch out for salads. Many people think salads are a healthy option, but this is often not the case. Many salads come with bacon, fried chicken, lots of cheese, fried chips, and dressing. All of these items have a lot of calories. If you want a salad, choose a garden salad  and ask for grilled meats or steak. Ask for the dressing on the side, or ask for olive oil and vinegar or lemon to use as dressing.  Estimate how many servings of a food you are given. For example, a serving of cooked rice is  cup or about the size of half a tennis ball or one cupcake wrapper. Knowing serving sizes will help you be aware of how much food you are eating at restaurants. The list below tells you how big or small some common portion sizes are based on everyday objects.  1 oz--4 stacked dice.  3 oz--1 deck of cards.  1 tsp--1 dice.  1 Tbsp-- a Ping-Pong ball.  2 Tbsp--1 Ping-Pong ball.   cup--1 tennis ball or 1 cupcake wrapper.  1 cup--1 baseball. Document Released: 05/02/2005 Document Revised: 09/16/2013 Document Reviewed: 03/07/2013 [facility] Patient Information 2015 [place], [place]. This information is not intended to replace advice given to you by your health care provider. Make sure you discuss any questions you have with your health care provider.

## 2014-12-13 LAB — CMP14+EGFR
ALT: 34 [IU]/L — ABNORMAL HIGH (ref 0–32)
AST: 22 [IU]/L (ref 0–40)
Albumin/Globulin Ratio: 1.6 (ref 1.1–2.5)
Albumin: 4.2 g/dL (ref 3.5–5.5)
Alkaline Phosphatase: 75 [IU]/L (ref 39–117)
BUN/Creatinine Ratio: 15 (ref 9–23)
BUN: 14 mg/dL (ref 6–24)
Bilirubin Total: 0.2 mg/dL (ref 0.0–1.2)
CO2: 21 mmol/L (ref 18–29)
Calcium: 9.3 mg/dL (ref 8.7–10.2)
Chloride: 103 mmol/L (ref 97–108)
Creatinine, Ser: 0.93 mg/dL (ref 0.57–1.00)
GFR calc Af Amer: 89 mL/min/{1.73_m2} (ref >59)
GFR calc non Af Amer: 77 mL/min/{1.73_m2} (ref >59)
Globulin, Total: 2.7 g/dL (ref 1.5–4.5)
Glucose: 93 mg/dL (ref 65–99)
Potassium: 4.2 mmol/L (ref 3.5–5.2)
Sodium: 141 mmol/L (ref 134–144)
Total Protein: 6.9 g/dL (ref 6.0–8.5)

## 2014-12-15 ENCOUNTER — Telehealth: Payer: Self-pay | Admitting: *Deleted

## 2014-12-15 NOTE — Telephone Encounter (Signed)
Patient aware of lab results.

## 2014-12-15 NOTE — Telephone Encounter (Signed)
-----   Message from Sharion Balloon, [place] sent at 12/15/2014 10:45 AM EDT ----- Kidney and liver function stable

## 2014-12-23 ENCOUNTER — Telehealth: Payer: Self-pay

## 2014-12-23 NOTE — Telephone Encounter (Signed)
Insurance denied prior authorization for Phentermine

## 2015-01-14 ENCOUNTER — Encounter: Payer: Self-pay | Admitting: *Deleted

## 2015-03-10 ENCOUNTER — Ambulatory Visit: Payer: BC Managed Care – PPO | Admitting: Family

## 2015-03-11 ENCOUNTER — Ambulatory Visit (INDEPENDENT_AMBULATORY_CARE_PROVIDER_SITE_OTHER): Payer: BC Managed Care – PPO | Admitting: Family

## 2015-03-11 ENCOUNTER — Encounter: Payer: Self-pay | Admitting: Family

## 2015-03-11 VITALS — BP 138/89 | HR 92 | Temp 99.0°F | Ht 66.0 in | Wt 270.2 lb

## 2015-03-11 DIAGNOSIS — E559 Vitamin D deficiency, unspecified: Secondary | ICD-10-CM | POA: Diagnosis not present

## 2015-03-11 DIAGNOSIS — Z713 Dietary counseling and surveillance: Secondary | ICD-10-CM | POA: Diagnosis not present

## 2015-03-11 DIAGNOSIS — F411 Generalized anxiety disorder: Secondary | ICD-10-CM

## 2015-03-11 DIAGNOSIS — I10 Essential (primary) hypertension: Secondary | ICD-10-CM

## 2015-03-11 DIAGNOSIS — F32A Depression, unspecified: Secondary | ICD-10-CM | POA: Insufficient documentation

## 2015-03-11 DIAGNOSIS — F329 Major depressive disorder, single episode, unspecified: Secondary | ICD-10-CM

## 2015-03-11 DIAGNOSIS — E039 Hypothyroidism, unspecified: Secondary | ICD-10-CM | POA: Diagnosis not present

## 2015-03-11 DIAGNOSIS — B070 Plantar wart: Secondary | ICD-10-CM | POA: Diagnosis not present

## 2015-03-11 MED ORDER — PHENTERMINE HCL 37.5 MG PO CAPS: 37.5000 mg | ORAL_CAPSULE | ORAL | Status: DC

## 2015-03-11 MED ORDER — SALICYLIC ACID 26 % EX LIQD: CUTANEOUS | Status: DC

## 2015-03-11 MED ORDER — ESCITALOPRAM OXALATE 10 MG PO TABS: 10.0000 mg | ORAL_TABLET | Freq: Every day | ORAL | Status: DC

## 2015-03-11 NOTE — Patient Instructions (Addendum)
Plantar Warts Warts are small growths on the skin. They can occur on various areas of the body. When they occur on the underside (sole) of the foot, they are called plantar warts. Plantar warts often occur in groups, with several small warts around a larger growth. They tend to develop over areas of pressure, such as the heel or the ball of the foot. Most warts are not painful, and they usually do not cause problems. However, plantar warts may cause pain when you walk because pressure is applied to them. Warts often go away on their own in time. Various treatments may be done if needed. Sometimes, warts go away and then they come back again. CAUSES Plantar warts are caused by a type of virus that is called human papillomavirus (HPV). HPV attacks a break in the skin of the foot. Walking barefoot can lead to exposure to the virus. These warts may spread to other areas of the sole. They spread to other areas of the body only through direct contact. RISK FACTORS Plantar warts are more likely to develop in:  People who are 10-20 years of age.  People who use public showers or locker rooms.  People who have a weakened body defense system (immune system). SYMPTOMS Plantar warts may be flat or slightly raised. They may grow into the deeper layers of skin or rise above the surface of the skin. Most plantar warts have a rough surface. They may cause pain when you use your foot to support your body weight. DIAGNOSIS A plantar wart can usually be diagnosed from its appearance. In some cases, a tissue sample may be removed (biopsy) to be looked at under a microscope. TREATMENT In many cases, warts do not need treatment. Without treatment, they often go away over a period of many months to a couple years. If treatment is needed, options may include:  Applying medicated solutions, creams, or patches to the wart. These may be over-the-counter or prescription medicines that make the skin soft so that layers will  gradually shed away. In many cases, the medicine is applied one or two times per day and covered with a bandage.  Putting duct tape over the top of the wart (occlusion). You will leave the tape in place for as long as told by your health care provider, then you will replace it with a new strip of tape. This is done until the wart goes away.  Freezing the wart with liquid nitrogen (cryotherapy).  Burning the wart with:  Laser treatment.  An electrified probe (electrocautery).  Injection of a medicine (Candida antigen) into the wart to help the body's immune system to fight off the wart.  Surgery to remove the wart. HOME CARE INSTRUCTIONS  Apply medicated creams or solutions only as told by your health care provider. This may involve:  Soaking the affected area in warm water.  Removing the top layer of softened skin before you apply the medicine. A pumice stone works well for removing the tissue.  Applying a bandage over the affected area after you apply the medicine.  Repeating the process daily or as told by your health care provider.  Do not scratch or pick at a wart.  Wash your hands after you touch a wart.  If a wart is painful, try applying a bandage with a hole in the middle over the wart. The helps to take pressure off the wart.  Keep all follow-up visits as told by your health care provider. This is important. PREVENTION   Take these actions to help prevent warts:  Wear shoes and socks. Change your socks daily.  Keep your feet clean and dry.  Check your feet regularly.  Avoid direct contact with warts on other people. SEEK MEDICAL CARE IF:  Your warts do not improve after treatment.  You have redness, swelling, or pain at the site of a wart.  You have bleeding from a wart that does not stop with light pressure.  You have diabetes and you develop a wart.   This information is not intended to replace advice given to you by your health care provider. Make sure  you discuss any questions you have with your health care provider.   Document Released: 07/23/2003 Document Revised: 01/21/2015 Document Reviewed: 07/28/2014 Elsevier Interactive Patient Education 2016 [employer]  Generalized Anxiety Disorder Generalized anxiety disorder (GAD) is a mental disorder. It interferes with life functions, including relationships, work, and school. GAD is different from normal anxiety, which everyone experiences at some point in their lives in response to specific life events and activities. Normal anxiety actually helps [place] prepare for and get through these life events and activities. Normal anxiety goes away after the event or activity is over.  GAD causes anxiety that is not necessarily related to specific events or activities. It also causes excess anxiety in proportion to specific events or activities. The anxiety associated with GAD is also difficult to control. GAD can vary from mild to severe. People with severe GAD can have intense waves of anxiety with physical symptoms (panic attacks).  SYMPTOMS The anxiety and worry associated with GAD are difficult to control. This anxiety and worry are related to many life events and activities and also occur more days than not for 6 months or longer. People with GAD also have three or more of the following symptoms (one or more in children):  Restlessness.   Fatigue.  Difficulty concentrating.   Irritability.  Muscle tension.  Difficulty sleeping or unsatisfying sleep. DIAGNOSIS GAD is diagnosed through an assessment by your health care provider. Your health care provider will ask you questions aboutyour mood,physical symptoms, and events in your life. Your health care provider may ask you about your medical history and use of alcohol or drugs, including prescription medicines. Your health care provider may also do a physical exam and blood tests. Certain medical conditions and the use of certain substances can  cause symptoms similar to those associated with GAD. Your health care provider may refer you to a mental health specialist for further evaluation. TREATMENT The following therapies are usually used to treat GAD:   Medication. Antidepressant medication usually is prescribed for long-term daily control. Antianxiety medicines may be added in severe cases, especially when panic attacks occur.   Talk therapy (psychotherapy). Certain types of talk therapy can be helpful in treating GAD by providing support, education, and guidance. A form of talk therapy called cognitive behavioral therapy can teach you healthy ways to think about and react to daily life events and activities.  Stress managementtechniques. These include yoga, meditation, and exercise and can be very helpful when they are practiced regularly. A mental health specialist can help determine which treatment is best for you. Some people see improvement with one therapy. However, other people require a combination of therapies.   This information is not intended to replace advice given to you by your health care provider. Make sure you discuss any questions you have with your health care provider.   Document Released: 08/27/2012 Document Revised:  05/23/2014 Document Reviewed: 08/27/2012 Elsevier Interactive Patient Education 2016 [employer] Major Depressive Disorder Major depressive disorder is a mental illness. It also may be called clinical depression or unipolar depression. Major depressive disorder usually causes feelings of sadness, hopelessness, or helplessness. Some people with this disorder do not feel particularly sad but lose interest in doing things they used to enjoy (anhedonia). Major depressive disorder also can cause physical symptoms. It can interfere with work, school, relationships, and other normal everyday activities. The disorder varies in severity but is longer lasting and more serious than the sadness we all feel from  time to time in our lives. Major depressive disorder often is triggered by stressful life events or major life changes. Examples of these triggers include divorce, loss of your job or home, a move, and the death of a family member or close friend. Sometimes this disorder occurs for no obvious reason at all. People who have family members with major depressive disorder or bipolar disorder are at higher risk for developing this disorder, with or without life stressors. Major depressive disorder can occur at any age. It may occur just once in your life (single episode major depressive disorder). It may occur multiple times (recurrent major depressive disorder). SYMPTOMS People with major depressive disorder have either anhedonia or depressed mood on nearly a daily basis for at least 2 weeks or longer. Symptoms of depressed mood include:  Feelings of sadness (blue or down in the dumps) or emptiness.  Feelings of hopelessness or helplessness.  Tearfulness or episodes of crying (may be observed by others).  Irritability (children and adolescents). In addition to depressed mood or anhedonia or both, people with this disorder have at least four of the following symptoms:  Difficulty sleeping or sleeping too much.   Significant change (increase or decrease) in appetite or weight.   Lack of energy or motivation.  Feelings of guilt and worthlessness.   Difficulty concentrating, remembering, or making decisions.  Unusually slow movement (psychomotor retardation) or restlessness (as observed by others).   Recurrent wishes for death, recurrent thoughts of self-harm (suicide), or a suicide attempt. People with major depressive disorder commonly have persistent negative thoughts about themselves, other people, and the world. People with severe major depressive disorder may experiencedistorted beliefs or perceptions about the world (psychotic delusions). They also may see or hear things that are not  real (psychotic hallucinations). DIAGNOSIS Major depressive disorder is diagnosed through an assessment by your health care provider. Your health care provider will ask aboutaspects of your daily life, such as mood,sleep, and appetite, to see if you have the diagnostic symptoms of major depressive disorder. Your health care provider may ask about your medical history and use of alcohol or drugs, including prescription medicines. Your health care provider also may do a physical exam and blood work. This is because certain medical conditions and the use of certain substances can cause major depressive disorder-like symptoms (secondary depression). Your health care provider also may refer you to a mental health specialist for further evaluation and treatment. TREATMENT It is important to recognize the symptoms of major depressive disorder and seek treatment. The following treatments can be prescribed for this disorder:   Medicine. Antidepressant medicines usually are prescribed. Antidepressant medicines are thought to correct chemical imbalances in the brain that are commonly associated with major depressive disorder. Other types of medicine may be added if the symptoms do not respond to antidepressant medicines alone or if psychotic delusions or hallucinations occur.  Talk therapy. Talk  therapy can be helpful in treating major depressive disorder by providing support, education, and guidance. Certain types of talk therapy also can help with negative thinking (cognitive behavioral therapy) and with relationship issues that trigger this disorder (interpersonal therapy). A mental health specialist can help determine which treatment is best for you. Most people with major depressive disorder do well with a combination of medicine and talk therapy. Treatments involving electrical stimulation of the brain can be used in situations with extremely severe symptoms or when medicine and talk therapy do not work over  time. These treatments include electroconvulsive therapy, transcranial magnetic stimulation, and vagal nerve stimulation.   This information is not intended to replace advice given to you by your health care provider. Make sure you discuss any questions you have with your health care provider.   Document Released: 08/27/2012 Document Revised: 05/23/2014 Document Reviewed: 08/27/2012 Elsevier Interactive Patient Education [employer].

## 2015-03-11 NOTE — Progress Notes (Signed)
Subjective:    Patient ID: Heather Hardin, female    DOB: 09-Jun-1975, 39 y.o.   MRN: [id number]  Pt presents to the office today for chronic follow up. Pt is currently going through a divorce and is very tearful today. Pt states she feels overwhelmed. Pt also wants to discuss weight loss. Pt states she has lost about 30 lbs since starting. Hypertension This is a chronic problem. The current episode started more than 1 month ago. The problem has been resolved since onset. The problem is controlled. Associated symptoms include anxiety and peripheral edema ("at times"). Pertinent negatives include no headaches, palpitations or shortness of breath. Risk factors for coronary artery disease include family history, obesity and sedentary lifestyle. Past treatments include ACE inhibitors. The current treatment provides moderate improvement. Hypertensive end-organ damage includes a thyroid problem. There is no history of kidney disease, CAD/MI, CVA or heart failure. There is no history of sleep apnea.  Anxiety Presents for initial visit. Onset was 1 to 5 years ago. The problem has been waxing and waning. Symptoms include depressed mood, excessive worry, insomnia, irritability and restlessness. Patient reports no palpitations or shortness of breath. Symptoms occur most days. The severity of symptoms is moderate. The symptoms are aggravated by family issues.   Her past medical history is significant for anxiety/panic attacks and depression. Past treatments include nothing.  Depression      The patient presents with depression.  This is a new problem.  The current episode started more than 1 year ago.   The onset quality is gradual.   The problem occurs constantly.  The problem has been waxing and waning since onset.  Associated symptoms include helplessness, insomnia, irritable, restlessness and sad.  Associated symptoms include no headaches.  Past treatments include nothing.  Past medical history includes  thyroid problem, anxiety and depression.       Review of Systems  Constitutional: Positive for irritability.  HENT: Negative.   Eyes: Negative.   Respiratory: Negative.  Negative for shortness of breath.   Cardiovascular: Negative.  Negative for palpitations.  Gastrointestinal: Negative.   Endocrine: Negative.   Genitourinary: Negative.   Musculoskeletal: Negative.   Neurological: Negative.  Negative for headaches.  Hematological: Negative.   Psychiatric/Behavioral: Positive for depression. The patient has insomnia.   All other systems reviewed and are negative.      Objective:   Physical Exam  Constitutional: She is oriented to person, place, and time. She appears well-developed and well-nourished. She is irritable. No distress.  HENT:  Head: Normocephalic and atraumatic.  Right Ear: External ear normal.  Left Ear: External ear normal.  Nose: Nose normal.  Mouth/Throat: Oropharynx is clear and moist.  Eyes: Pupils are equal, round, and reactive to light.  Neck: Normal range of motion. Neck supple. No thyromegaly present.  Cardiovascular: Normal rate, regular rhythm, normal heart sounds and intact distal pulses.   No murmur heard. Pulmonary/Chest: Effort normal and breath sounds normal. No respiratory distress. She has no wheezes.  Abdominal: Soft. Bowel sounds are normal. She exhibits no distension. There is no tenderness.  Musculoskeletal: Normal range of motion. She exhibits no edema or tenderness.  Neurological: She is alert and oriented to person, place, and time. She has normal reflexes. No cranial nerve deficit.  Skin: Skin is warm and dry.  Psychiatric: She has a normal mood and affect. Her behavior is normal. Judgment and thought content normal.  Vitals reviewed.     BP 138/89 mmHg  Pulse 92  Temp(Src)  99 F (37.2 C) (Oral)  Ht 5' 6"  (1.676 m)  Wt 270 lb 3.2 oz (122.562 kg)  BMI 43.63 kg/m2     Assessment & Plan:  1. Essential hypertension -  CMP14+EGFR  2. Hypothyroidism, unspecified hypothyroidism type - CMP14+EGFR - Thyroid Panel With TSH  3. Vitamin D deficiency - CMP14+EGFR - Vit D  25 hydroxy (rtn osteoporosis monitoring)  4. Encounter for weight loss counseling - CMP14+EGFR - phentermine 37.5 MG capsule; Take 1 capsule (37.5 mg total) by mouth every morning.  Dispense: 90 capsule; Refill: 0  5. GAD (generalized anxiety disorder) -Stress management discussed -Lexapro started today - CMP14+EGFR - escitalopram (LEXAPRO) 10 MG tablet; Take 1 tablet (10 mg total) by mouth daily.  Dispense: 90 tablet; Refill: 0  6. Depression --Stress management discussed -Lexapro started today - CMP14+EGFR - escitalopram (LEXAPRO) 10 MG tablet; Take 1 tablet (10 mg total) by mouth daily.  Dispense: 90 tablet; Refill: 0  7. Plantar wart - Salicylic Acid 26 % LIQD; Apply 1-2 drops onto affected area then cover with bandage for 12-24 hours  Dispense: 10 mL; Refill: 2   Continue all meds Labs pending Health Maintenance reviewed Diet and exercise encouraged RTO 2 months GAD and Depression  Evelina Dun, FNP

## 2015-03-12 ENCOUNTER — Other Ambulatory Visit: Payer: Self-pay | Admitting: Family

## 2015-03-12 LAB — CMP14+EGFR
ALT: 41 [IU]/L — ABNORMAL HIGH (ref 0–32)
AST: 24 [IU]/L (ref 0–40)
Albumin/Globulin Ratio: 1.6 (ref 1.1–2.5)
Albumin: 4.3 g/dL (ref 3.5–5.5)
Alkaline Phosphatase: 78 [IU]/L (ref 39–117)
BUN/Creatinine Ratio: 12 (ref 9–23)
BUN: 11 mg/dL (ref 6–24)
Bilirubin Total: 0.3 mg/dL (ref 0.0–1.2)
CO2: 24 mmol/L (ref 18–29)
Calcium: 9.6 mg/dL (ref 8.7–10.2)
Chloride: 101 mmol/L (ref 97–106)
Creatinine, Ser: 0.93 mg/dL (ref 0.57–1.00)
GFR calc Af Amer: 89 mL/min/{1.73_m2} (ref >59)
GFR calc non Af Amer: 77 mL/min/{1.73_m2} (ref >59)
Globulin, Total: 2.7 g/dL (ref 1.5–4.5)
Glucose: 97 mg/dL (ref 65–99)
Potassium: 4.4 mmol/L (ref 3.5–5.2)
Sodium: 140 mmol/L (ref 136–144)
Total Protein: 7 g/dL (ref 6.0–8.5)

## 2015-03-12 LAB — THYROID PANEL WITH TSH
Free Thyroxine Index: 2.8 (ref 1.2–4.9)
T3 Uptake Ratio: 27 % (ref 24–39)
T4, Total: 10.4 ug/dL (ref 4.5–12.0)
TSH: 2.11 u[IU]/mL (ref 0.450–4.500)

## 2015-03-12 LAB — VITAMIN D 25 HYDROXY (VIT D DEFICIENCY, FRACTURES): Vit D, 25-Hydroxy: 22.5 ng/mL — ABNORMAL LOW (ref 30.0–100.0)

## 2015-05-12 ENCOUNTER — Ambulatory Visit: Payer: BC Managed Care – PPO | Admitting: Family

## 2015-05-22 ENCOUNTER — Ambulatory Visit: Payer: BC Managed Care – PPO | Admitting: Family

## 2015-06-18 ENCOUNTER — Encounter: Payer: Self-pay | Admitting: Family

## 2015-06-18 ENCOUNTER — Ambulatory Visit (INDEPENDENT_AMBULATORY_CARE_PROVIDER_SITE_OTHER): Payer: BC Managed Care – PPO | Admitting: Family

## 2015-06-18 VITALS — BP 131/98 | HR 75 | Temp 98.2°F | Ht 66.0 in | Wt 261.0 lb

## 2015-06-18 DIAGNOSIS — E039 Hypothyroidism, unspecified: Secondary | ICD-10-CM | POA: Diagnosis not present

## 2015-06-18 DIAGNOSIS — E559 Vitamin D deficiency, unspecified: Secondary | ICD-10-CM

## 2015-06-18 DIAGNOSIS — E781 Pure hyperglyceridemia: Secondary | ICD-10-CM | POA: Diagnosis not present

## 2015-06-18 DIAGNOSIS — F411 Generalized anxiety disorder: Secondary | ICD-10-CM | POA: Diagnosis not present

## 2015-06-18 DIAGNOSIS — F329 Major depressive disorder, single episode, unspecified: Secondary | ICD-10-CM

## 2015-06-18 DIAGNOSIS — I10 Essential (primary) hypertension: Secondary | ICD-10-CM

## 2015-06-18 DIAGNOSIS — F32A Depression, unspecified: Secondary | ICD-10-CM

## 2015-06-18 MED ORDER — ESCITALOPRAM OXALATE 10 MG PO TABS: 10.0000 mg | ORAL_TABLET | Freq: Every day | ORAL | Status: DC

## 2015-06-18 NOTE — Patient Instructions (Signed)
Health Maintenance, Female Adopting a healthy lifestyle and getting preventive care can go a long way to promote health and wellness. Talk with your health care provider about what schedule of regular examinations is right for you. This is a good chance for you to check in with your provider about disease prevention and staying healthy. In between checkups, there are plenty of things you can do on your own. Experts have done a lot of research about which lifestyle changes and preventive measures are most likely to keep you healthy. Ask your health care provider for more information. WEIGHT AND DIET  Eat a healthy diet  Be sure to include plenty of vegetables, fruits, low-fat dairy products, and lean protein.  Do not eat a lot of foods high in solid fats, added sugars, or salt.  Get regular exercise. This is one of the most important things you can do for your health.  Most adults should exercise for at least 150 minutes each week. The exercise should increase your heart rate and make you sweat (moderate-intensity exercise).  Most adults should also do strengthening exercises at least twice a week. This is in addition to the moderate-intensity exercise.  Maintain a healthy weight  Body mass index (BMI) is a measurement that can be used to identify possible weight problems. It estimates body fat based on height and weight. Your health care provider can help determine your BMI and help you achieve or maintain a healthy weight.  For females 20 years of age and older:   A BMI below 18.5 is considered underweight.  A BMI of 18.5 to 24.9 is normal.  A BMI of 25 to 29.9 is considered overweight.  A BMI of 30 and above is considered obese.  Watch levels of cholesterol and blood lipids  You should start having your blood tested for lipids and cholesterol at 41 years of age, then have this test every 5 years.  You may need to have your cholesterol levels checked more often if:  Your lipid  or cholesterol levels are high.  You are older than 41 years of age.  You are at high risk for heart disease.  CANCER SCREENING   Lung Cancer  Lung cancer screening is recommended for adults 55-80 years old who are at high risk for lung cancer because of a history of smoking.  A yearly low-dose CT scan of the lungs is recommended for people who:  Currently smoke.  Have quit within the past 15 years.  Have at least a 30-pack-year history of smoking. A pack year is smoking an average of one pack of cigarettes a day for 1 year.  Yearly screening should continue until it has been 15 years since you quit.  Yearly screening should stop if you develop a health problem that would prevent you from having lung cancer treatment.  Breast Cancer  Practice breast self-awareness. This means understanding how your breasts normally appear and feel.  It also means doing regular breast self-exams. Let your health care provider know about any changes, no matter how small.  If you are in your 20s or 30s, you should have a clinical breast exam (CBE) by a health care provider every 1-3 years as part of a regular health exam.  If you are 40 or older, have a CBE every year. Also consider having a breast X-ray (mammogram) every year.  If you have a family history of breast cancer, talk to your health care provider about genetic screening.  If you   are at high risk for breast cancer, talk to your health care provider about having an MRI and a mammogram every year.  Breast cancer gene (BRCA) assessment is recommended for women who have family members with BRCA-related cancers. BRCA-related cancers include:  Breast.  Ovarian.  Tubal.  Peritoneal cancers.  Results of the assessment will determine the need for genetic counseling and BRCA1 and BRCA2 testing. Cervical Cancer Your health care provider may recommend that you be screened regularly for cancer of the pelvic organs (ovaries, uterus, and  vagina). This screening involves a pelvic examination, including checking for microscopic changes to the surface of your cervix (Pap test). You may be encouraged to have this screening done every 3 years, beginning at age 21.  For women ages 30-65, health care providers may recommend pelvic exams and Pap testing every 3 years, or they may recommend the Pap and pelvic exam, combined with testing for human papilloma virus (HPV), every 5 years. Some types of HPV increase your risk of cervical cancer. Testing for HPV may also be done on women of any age with unclear Pap test results.  Other health care providers may not recommend any screening for nonpregnant women who are considered low risk for pelvic cancer and who do not have symptoms. Ask your health care provider if a screening pelvic exam is right for you.  If you have had past treatment for cervical cancer or a condition that could lead to cancer, you need Pap tests and screening for cancer for at least 20 years after your treatment. If Pap tests have been discontinued, your risk factors (such as having a new sexual partner) need to be reassessed to determine if screening should resume. Some women have medical problems that increase the chance of getting cervical cancer. In these cases, your health care provider may recommend more frequent screening and Pap tests. Colorectal Cancer  This type of cancer can be detected and often prevented.  Routine colorectal cancer screening usually begins at 41 years of age and continues through 41 years of age.  Your health care provider may recommend screening at an earlier age if you have risk factors for colon cancer.  Your health care provider may also recommend using home test kits to check for hidden blood in the stool.  A small camera at the end of a tube can be used to examine your colon directly (sigmoidoscopy or colonoscopy). This is done to check for the earliest forms of colorectal  cancer.  Routine screening usually begins at age 50.  Direct examination of the colon should be repeated every 5-10 years through 41 years of age. However, you may need to be screened more often if early forms of precancerous polyps or small growths are found. Skin Cancer  Check your skin from head to toe regularly.  Tell your health care provider about any new moles or changes in moles, especially if there is a change in a mole's shape or color.  Also tell your health care provider if you have a mole that is larger than the size of a pencil eraser.  Always use sunscreen. Apply sunscreen liberally and repeatedly throughout the day.  Protect yourself by wearing long sleeves, pants, a wide-brimmed hat, and sunglasses whenever you are outside. HEART DISEASE, DIABETES, AND HIGH BLOOD PRESSURE   High blood pressure causes heart disease and increases the risk of stroke. High blood pressure is more likely to develop in:  People who have blood pressure in the high end   of the normal range (130-139/85-89 mm Hg).  People who are overweight or obese.  People who are African American.  If you are 38-23 years of age, have your blood pressure checked every 3-5 years. If you are 61 years of age or older, have your blood pressure checked every year. You should have your blood pressure measured twice--once when you are at a hospital or clinic, and once when you are not at a hospital or clinic. Record the average of the two measurements. To check your blood pressure when you are not at a hospital or clinic, you can use:  An automated blood pressure machine at a pharmacy.  A home blood pressure monitor.  If you are between 45 years and 39 years old, ask your health care provider if you should take aspirin to prevent strokes.  Have regular diabetes screenings. This involves taking a blood sample to check your fasting blood sugar level.  If you are at a normal weight and have a low risk for diabetes,  have this test once every three years after 41 years of age.  If you are overweight and have a high risk for diabetes, consider being tested at a younger age or more often. PREVENTING INFECTION  Hepatitis B  If you have a higher risk for hepatitis B, you should be screened for this virus. You are considered at high risk for hepatitis B if:  You were born in a country where hepatitis B is common. Ask your health care provider which countries are considered high risk.  Your parents were born in a high-risk country, and you have not been immunized against hepatitis B (hepatitis B vaccine).  You have HIV or AIDS.  You use needles to inject street drugs.  You live with someone who has hepatitis B.  You have had sex with someone who has hepatitis B.  You get hemodialysis treatment.  You take certain medicines for conditions, including cancer, organ transplantation, and autoimmune conditions. Hepatitis C  Blood testing is recommended for:  Everyone born from [birth year] through 1966.  Anyone with known risk factors for hepatitis C. Sexually transmitted infections (STIs)  You should be screened for sexually transmitted infections (STIs) including gonorrhea and chlamydia if:  You are sexually active and are younger than 41 years of age.  You are older than 41 years of age and your health care provider tells you that you are at risk for this type of infection.  Your sexual activity has changed since you were last screened and you are at an increased risk for chlamydia or gonorrhea. Ask your health care provider if you are at risk.  If you do not have HIV, but are at risk, it may be recommended that you take a prescription medicine daily to prevent HIV infection. This is called pre-exposure prophylaxis (PrEP). You are considered at risk if:  You are sexually active and do not regularly use condoms or know the HIV status of your partner(s).  You take drugs by injection.  You are sexually  active with a partner who has HIV. Talk with your health care provider about whether you are at high risk of being infected with HIV. If you choose to begin PrEP, you should first be tested for HIV. You should then be tested every 3 months for as long as you are taking PrEP.  PREGNANCY   If you are premenopausal and you may become pregnant, ask your health care provider about preconception counseling.  If you may  become pregnant, take 400 to 800 micrograms (mcg) of folic acid every day.  If you want to prevent pregnancy, talk to your health care provider about birth control (contraception). OSTEOPOROSIS AND MENOPAUSE   Osteoporosis is a disease in which the bones lose minerals and strength with aging. This can result in serious bone fractures. Your risk for osteoporosis can be identified using a bone density scan.  If you are 61 years of age or older, or if you are at risk for osteoporosis and fractures, ask your health care provider if you should be screened.  Ask your health care provider whether you should take a calcium or vitamin D supplement to lower your risk for osteoporosis.  Menopause may have certain physical symptoms and risks.  Hormone replacement therapy may reduce some of these symptoms and risks. Talk to your health care provider about whether hormone replacement therapy is right for you.  HOME CARE INSTRUCTIONS   Schedule regular health, dental, and eye exams.  Stay current with your immunizations.   Do not use any tobacco products including cigarettes, chewing tobacco, or electronic cigarettes.  If you are pregnant, do not drink alcohol.  If you are breastfeeding, limit how much and how often you drink alcohol.  Limit alcohol intake to no more than 1 drink per day for nonpregnant women. One drink equals 12 ounces of beer, 5 ounces of wine, or 1 ounces of hard liquor.  Do not use street drugs.  Do not share needles.  Ask your health care provider for help if  you need support or information about quitting drugs.  Tell your health care provider if you often feel depressed.  Tell your health care provider if you have ever been abused or do not feel safe at home.   This information is not intended to replace advice given to you by your health care provider. Make sure you discuss any questions you have with your health care provider.   Document Released: 11/15/2010 Document Revised: 05/23/2014 Document Reviewed: 04/03/2013 Elsevier Interactive Patient Education [employer].

## 2015-06-18 NOTE — Progress Notes (Signed)
Subjective:    Patient ID: Heather Hardin, female    DOB: April 15, 1976, 40 y.o.   MRN: [id number]  Pt presents to the office today for chronic follow up. PT states she just had a tooth  Extraction and is in a great deal of pain. PT states she is having constant pain of 5 out 10.  Hypertension This is a chronic problem. The current episode started more than 1 month ago. The problem has been resolved since onset. The problem is uncontrolled. Pertinent negatives include no headaches, palpitations, peripheral edema or shortness of breath. Risk factors for coronary artery disease include family history, obesity and sedentary lifestyle. Past treatments include ACE inhibitors. The current treatment provides moderate improvement. Hypertensive end-organ damage includes a thyroid problem. There is no history of kidney disease, CAD/MI, CVA or heart failure. There is no history of sleep apnea.  Anxiety Presents for initial visit. Onset was 1 to 5 years ago. The problem has been waxing and waning. Symptoms include depressed mood, excessive worry, insomnia, irritability, nervous/anxious behavior and restlessness. Patient reports no palpitations or shortness of breath. Symptoms occur most days. The severity of symptoms is moderate. The symptoms are aggravated by family issues.   Her past medical history is significant for anxiety/panic attacks and depression. Past treatments include nothing.  Depression      The patient presents with depression.  This is a new problem.  The current episode started more than 1 year ago.   The onset quality is gradual.   The problem occurs constantly.  The problem has been waxing and waning since onset.  Associated symptoms include helplessness, insomnia, irritable, restlessness and sad.  Associated symptoms include no fatigue and no headaches.  Past treatments include nothing.  Past medical history includes thyroid problem and depression.   Thyroid Problem Presents for follow-up  visit. Symptoms include anxiety, depressed mood and hoarse voice. Patient reports no constipation, dry skin, fatigue or palpitations. The symptoms have been stable. Past treatments include levothyroxine. The treatment provided moderate relief. There is no history of heart failure.      Review of Systems  Constitutional: Positive for irritability. Negative for fatigue.  HENT: Positive for hoarse voice.   Eyes: Negative.   Respiratory: Negative.  Negative for shortness of breath.   Cardiovascular: Negative.  Negative for palpitations.  Gastrointestinal: Negative.  Negative for constipation.  Endocrine: Negative.   Genitourinary: Negative.   Musculoskeletal: Negative.   Neurological: Negative.  Negative for headaches.  Hematological: Negative.   Psychiatric/Behavioral: Positive for depression. The patient is nervous/anxious and has insomnia.   All other systems reviewed and are negative.      Objective:   Physical Exam  Constitutional: She is oriented to person, place, and time. She appears well-developed and well-nourished. She is irritable. No distress.  HENT:  Head: Normocephalic and atraumatic.  Right Ear: External ear normal.  Left Ear: External ear normal.  Nose: Nose normal.  Mouth/Throat: Oropharynx is clear and moist.  Eyes: Pupils are equal, round, and reactive to light.  Neck: Normal range of motion. Neck supple. No thyromegaly present.  Cardiovascular: Normal rate, regular rhythm, normal heart sounds and intact distal pulses.   No murmur heard. Pulmonary/Chest: Effort normal and breath sounds normal. No respiratory distress. She has no wheezes.  Abdominal: Soft. Bowel sounds are normal. She exhibits no distension. There is no tenderness.  Musculoskeletal: Normal range of motion. She exhibits no edema or tenderness.  Neurological: She is alert and oriented to person, place, and  time. She has normal reflexes. No cranial nerve deficit.  Skin: Skin is warm and dry.    Psychiatric: She has a normal mood and affect. Her behavior is normal. Judgment and thought content normal.  Vitals reviewed.     BP 131/98 mmHg  Pulse 75  Temp(Src) 98.2 F (36.8 C) (Oral)  Ht 5' 6"  (1.676 m)  Wt 261 lb (118.389 kg)  BMI 42.15 kg/m2     Assessment & Plan:  1. Essential hypertension - CMP14+EGFR  2. Hypothyroidism, unspecified hypothyroidism type - CMP14+EGFR - Thyroid Panel With TSH  3. Vitamin D deficiency - CMP14+EGFR - VITAMIN D 25 Hydroxy (Vit-D Deficiency, Fractures)  4. Depression - escitalopram (LEXAPRO) 10 MG tablet; Take 1 tablet (10 mg total) by mouth daily.  Dispense: 90 tablet; Refill: 2 - CMP14+EGFR  5. GAD (generalized anxiety disorder) - escitalopram (LEXAPRO) 10 MG tablet; Take 1 tablet (10 mg total) by mouth daily.  Dispense: 90 tablet; Refill: 2 - CMP14+EGFR  6. Hypertriglyceridemia - CMP14+EGFR - Lipid panel   Continue all meds Labs pending Health Maintenance reviewed Diet and exercise encouraged RTO 6 months  Evelina Dun, FNP

## 2015-06-19 ENCOUNTER — Other Ambulatory Visit: Payer: Self-pay | Admitting: Family

## 2015-06-19 LAB — CMP14+EGFR
ALT: 28 [IU]/L (ref 0–32)
AST: 21 [IU]/L (ref 0–40)
Albumin/Globulin Ratio: 1.6 (ref 1.1–2.5)
Albumin: 4.2 g/dL (ref 3.5–5.5)
Alkaline Phosphatase: 76 [IU]/L (ref 39–117)
BUN/Creatinine Ratio: 14 (ref 9–23)
BUN: 12 mg/dL (ref 6–24)
Bilirubin Total: 0.5 mg/dL (ref 0.0–1.2)
CO2: 22 mmol/L (ref 18–29)
Calcium: 9 mg/dL (ref 8.7–10.2)
Chloride: 104 mmol/L (ref 96–106)
Creatinine, Ser: 0.83 mg/dL (ref 0.57–1.00)
GFR calc Af Amer: 102 mL/min/{1.73_m2} (ref >59)
GFR calc non Af Amer: 88 mL/min/{1.73_m2} (ref >59)
Globulin, Total: 2.7 g/dL (ref 1.5–4.5)
Glucose: 71 mg/dL (ref 65–99)
Potassium: 3.8 mmol/L (ref 3.5–5.2)
Sodium: 142 mmol/L (ref 134–144)
Total Protein: 6.9 g/dL (ref 6.0–8.5)

## 2015-06-19 LAB — LIPID PANEL
Chol/HDL Ratio: 5.2 {ratio} — ABNORMAL HIGH (ref 0.0–4.4)
Cholesterol, Total: 181 mg/dL (ref 100–199)
HDL: 35 mg/dL — ABNORMAL LOW (ref >39)
LDL Calculated: 122 mg/dL — ABNORMAL HIGH (ref 0–99)
Triglycerides: 122 mg/dL (ref 0–149)
VLDL Cholesterol Cal: 24 mg/dL (ref 5–40)

## 2015-06-19 LAB — THYROID PANEL WITH TSH
Free Thyroxine Index: 3.1 (ref 1.2–4.9)
T3 Uptake Ratio: 29 % (ref 24–39)
T4, Total: 10.8 ug/dL (ref 4.5–12.0)
TSH: 1.11 u[IU]/mL (ref 0.450–4.500)

## 2015-06-19 LAB — VITAMIN D 25 HYDROXY (VIT D DEFICIENCY, FRACTURES): Vit D, 25-Hydroxy: 24.2 ng/mL — ABNORMAL LOW (ref 30.0–100.0)

## 2015-08-31 ENCOUNTER — Other Ambulatory Visit: Payer: Self-pay

## 2015-08-31 DIAGNOSIS — I10 Essential (primary) hypertension: Secondary | ICD-10-CM

## 2015-08-31 MED ORDER — LEVOTHYROXINE SODIUM 50 MCG PO TABS: 50.0000 ug | ORAL_TABLET | Freq: Every day | ORAL | Status: DC

## 2015-08-31 MED ORDER — LISINOPRIL 20 MG PO TABS: 20.0000 mg | ORAL_TABLET | Freq: Every day | ORAL | Status: DC

## 2015-11-23 ENCOUNTER — Other Ambulatory Visit: Payer: Self-pay | Admitting: Family

## 2015-11-23 NOTE — Telephone Encounter (Signed)
Last seen and last VIT D 06/18/15 [name]  24.2

## 2015-12-16 ENCOUNTER — Ambulatory Visit: Payer: BC Managed Care – PPO | Admitting: Family

## 2016-01-21 ENCOUNTER — Ambulatory Visit (INDEPENDENT_AMBULATORY_CARE_PROVIDER_SITE_OTHER): Payer: BC Managed Care – PPO | Admitting: Family

## 2016-01-21 ENCOUNTER — Encounter: Payer: Self-pay | Admitting: Family

## 2016-01-21 VITALS — BP 136/97 | HR 67 | Temp 97.1°F | Ht 66.0 in | Wt 300.0 lb

## 2016-01-21 DIAGNOSIS — F329 Major depressive disorder, single episode, unspecified: Secondary | ICD-10-CM

## 2016-01-21 DIAGNOSIS — E8881 Metabolic syndrome: Secondary | ICD-10-CM | POA: Insufficient documentation

## 2016-01-21 DIAGNOSIS — E781 Pure hyperglyceridemia: Secondary | ICD-10-CM

## 2016-01-21 DIAGNOSIS — F32A Depression, unspecified: Secondary | ICD-10-CM

## 2016-01-21 DIAGNOSIS — E559 Vitamin D deficiency, unspecified: Secondary | ICD-10-CM | POA: Diagnosis not present

## 2016-01-21 DIAGNOSIS — I10 Essential (primary) hypertension: Secondary | ICD-10-CM | POA: Diagnosis not present

## 2016-01-21 DIAGNOSIS — E039 Hypothyroidism, unspecified: Secondary | ICD-10-CM | POA: Diagnosis not present

## 2016-01-21 DIAGNOSIS — F411 Generalized anxiety disorder: Secondary | ICD-10-CM | POA: Diagnosis not present

## 2016-01-21 MED ORDER — LISINOPRIL-HYDROCHLOROTHIAZIDE 20-12.5 MG PO TABS: 1.0000 | ORAL_TABLET | Freq: Every day | ORAL | 3 refills | Status: DC

## 2016-01-21 MED ORDER — ESCITALOPRAM OXALATE 20 MG PO TABS: 20.0000 mg | ORAL_TABLET | Freq: Every day | ORAL | 5 refills | Status: DC

## 2016-01-21 NOTE — Progress Notes (Signed)
Subjective:    Patient ID: Heather Hardin, female    DOB: [birth year], 40 y.o.   MRN: [id number]  Pt presents to the office today for chronic follow up.  Hypertension  This is a chronic problem. The current episode started more than 1 month ago. The problem has been waxing and waning since onset. The problem is uncontrolled. Associated symptoms include anxiety. Pertinent negatives include no headaches, palpitations, peripheral edema or shortness of breath. Risk factors for coronary artery disease include family history, obesity and sedentary lifestyle. Past treatments include ACE inhibitors. The current treatment provides moderate improvement. Hypertensive end-organ damage includes a thyroid problem. There is no history of kidney disease, CAD/MI, CVA or heart failure. There is no history of sleep apnea.  Anxiety  Presents for initial visit. Onset was 1 to 5 years ago. The problem has been waxing and waning. Symptoms include depressed mood, excessive worry, insomnia, irritability, nervous/anxious behavior and restlessness. Patient reports no palpitations or shortness of breath. Symptoms occur occasionally. The severity of symptoms is moderate. The symptoms are aggravated by family issues.   Her past medical history is significant for anxiety/panic attacks and depression. Past treatments include nothing.  Depression       The patient presents with depression.  This is a new problem.  The current episode started more than 1 year ago.   The onset quality is gradual.   The problem occurs constantly.  The problem has been waxing and waning since onset.  Associated symptoms include helplessness, insomnia, irritable, restlessness and sad.  Associated symptoms include no fatigue and no headaches.  Past treatments include nothing.  Past medical history includes thyroid problem, anxiety and depression.   Thyroid Problem  Presents for follow-up visit. Symptoms include anxiety, depressed mood and hoarse voice.  Patient reports no constipation, dry skin, fatigue or palpitations. The symptoms have been stable. Past treatments include levothyroxine. The treatment provided moderate relief. There is no history of heart failure.  Metabolic Syndrome Pt states she has gained "several pounds" and admits that she has not been on low fat or carb diet.     Review of Systems  Constitutional: Positive for irritability. Negative for fatigue.  HENT: Positive for hoarse voice.   Eyes: Negative.   Respiratory: Negative.  Negative for shortness of breath.   Cardiovascular: Negative.  Negative for palpitations.  Gastrointestinal: Negative.  Negative for constipation.  Endocrine: Negative.   Genitourinary: Negative.   Musculoskeletal: Negative.   Neurological: Negative.  Negative for headaches.  Hematological: Negative.   Psychiatric/Behavioral: Positive for depression. The patient is nervous/anxious and has insomnia.   All other systems reviewed and are negative.      Objective:   Physical Exam  Constitutional: She is oriented to person, place, and time. She appears well-developed and well-nourished. She is irritable. No distress.  HENT:  Head: Normocephalic and atraumatic.  Right Ear: External ear normal.  Left Ear: External ear normal.  Nose: Nose normal.  Mouth/Throat: Oropharynx is clear and moist.  Eyes: Pupils are equal, round, and reactive to light.  Neck: Normal range of motion. Neck supple. No thyromegaly present.  Cardiovascular: Normal rate, regular rhythm, normal heart sounds and intact distal pulses.   No murmur heard. Pulmonary/Chest: Effort normal and breath sounds normal. No respiratory distress. She has no wheezes.  Abdominal: Soft. Bowel sounds are normal. She exhibits no distension. There is no tenderness.  Musculoskeletal: Normal range of motion. She exhibits no edema or tenderness.  Neurological: She is alert and oriented  to person, place, and time. She has normal reflexes. No  cranial nerve deficit.  Skin: Skin is warm and dry.  Psychiatric: She has a normal mood and affect. Her behavior is normal. Judgment and thought content normal.  Vitals reviewed.     BP (!) 167/119   Pulse 67   Temp 97.1 F (36.2 C) (Oral)   Ht 5' 6"  (1.676 m)   Wt 300 lb (136.1 kg)   BMI 48.42 kg/m      Assessment & Plan:  1. Essential hypertension -Pt added on HCTZ 12.5 mg today - CMP14+EGFR - lisinopril-hydrochlorothiazide (ZESTORETIC) 20-12.5 MG tablet; Take 1 tablet by mouth daily.  Dispense: 90 tablet; Refill: 3  2. Hypothyroidism, unspecified hypothyroidism type - CMP14+EGFR - Thyroid Panel With TSH  3. Depression -Lexapro increased to 20 mg from 10 mg - CMP14+EGFR - escitalopram (LEXAPRO) 20 MG tablet; Take 1 tablet (20 mg total) by mouth daily.  Dispense: 30 tablet; Refill: 5  4. GAD (generalized anxiety disorder) -Lexapro increased to 20 mg from 10 mg - CMP14+EGFR - escitalopram (LEXAPRO) 20 MG tablet; Take 1 tablet (20 mg total) by mouth daily.  Dispense: 30 tablet; Refill: 5  5. Hypertriglyceridemia - CMP14+EGFR  6. Vitamin D deficiency - CMP14+EGFR - VITAMIN D 25 Hydroxy (Vit-D Deficiency, Fractures)  7. Metabolic syndrome - NXG33+POIP   Continue all meds Labs pending Health Maintenance reviewed Diet and exercise encouraged RTO 2 weeks to recheck HTN  Evelina Dun, FNP

## 2016-01-21 NOTE — Patient Instructions (Signed)
Hypertension Hypertension, commonly called high blood pressure, is when the force of blood pumping through your arteries is too strong. Your arteries are the blood vessels that carry blood from your heart throughout your body. A blood pressure reading consists of a higher number over a lower number, such as 110/72. The higher number (systolic) is the pressure inside your arteries when your heart pumps. The lower number (diastolic) is the pressure inside your arteries when your heart relaxes. Ideally you want your blood pressure below 120/80. Hypertension forces your heart to work harder to pump blood. Your arteries may become narrow or stiff. Having untreated or uncontrolled hypertension can cause heart attack, stroke, kidney disease, and other problems. RISK FACTORS Some risk factors for high blood pressure are controllable. Others are not.  Risk factors you cannot control include:   Race. You may be at higher risk if you are African American.  Age. Risk increases with age.  Gender. Men are at higher risk than women before age 45 years. After age 65, women are at higher risk than men. Risk factors you can control include:  Not getting enough exercise or physical activity.  Being overweight.  Getting too much fat, sugar, calories, or salt in your diet.  Drinking too much alcohol. SIGNS AND SYMPTOMS Hypertension does not usually cause signs or symptoms. Extremely high blood pressure (hypertensive crisis) may cause headache, anxiety, shortness of breath, and nosebleed. DIAGNOSIS To check if you have hypertension, your health care provider will measure your blood pressure while you are seated, with your arm held at the level of your heart. It should be measured at least twice using the same arm. Certain conditions can cause a difference in blood pressure between your right and left arms. A blood pressure reading that is higher than normal on one occasion does not mean that you need treatment. If  it is not clear whether you have high blood pressure, you may be asked to return on a different day to have your blood pressure checked again. Or, you may be asked to monitor your blood pressure at home for 1 or more weeks. TREATMENT Treating high blood pressure includes making lifestyle changes and possibly taking medicine. Living a healthy lifestyle can help lower high blood pressure. You may need to change some of your habits. Lifestyle changes may include:  Following the DASH diet. This diet is high in fruits, vegetables, and whole grains. It is low in salt, red meat, and added sugars.  Keep your sodium intake below 2,300 mg per day.  Getting at least 30-45 minutes of aerobic exercise at least 4 times per week.  Losing weight if necessary.  Not smoking.  Limiting alcoholic beverages.  Learning ways to reduce stress. Your health care provider may prescribe medicine if lifestyle changes are not enough to get your blood pressure under control, and if one of the following is true:  You are 18-59 years of age and your systolic blood pressure is above 140.  You are 60 years of age or older, and your systolic blood pressure is above 150.  Your diastolic blood pressure is above 90.  You have diabetes, and your systolic blood pressure is over 140 or your diastolic blood pressure is over 90.  You have kidney disease and your blood pressure is above 140/90.  You have heart disease and your blood pressure is above 140/90. Your personal target blood pressure may vary depending on your medical conditions, your age, and other factors. HOME CARE INSTRUCTIONS    Have your blood pressure rechecked as directed by your health care provider.   Take medicines only as directed by your health care provider. Follow the directions carefully. Blood pressure medicines must be taken as prescribed. The medicine does not work as well when you skip doses. Skipping doses also puts you at risk for  problems.  Do not smoke.   Monitor your blood pressure at home as directed by your health care provider. SEEK MEDICAL CARE IF:   You think you are having a reaction to medicines taken.  You have recurrent headaches or feel dizzy.  You have swelling in your ankles.  You have trouble with your vision. SEEK IMMEDIATE MEDICAL CARE IF:  You develop a severe headache or confusion.  You have unusual weakness, numbness, or feel faint.  You have severe chest or abdominal pain.  You vomit repeatedly.  You have trouble breathing. MAKE SURE YOU:   Understand these instructions.  Will watch your condition.  Will get help right away if you are not doing well or get worse.   This information is not intended to replace advice given to you by your health care provider. Make sure you discuss any questions you have with your health care provider.   Document Released: 05/02/2005 Document Revised: 09/16/2014 Document Reviewed: 02/22/2013 Elsevier Interactive Patient Education 2016 Elsevier Inc.  

## 2016-01-22 LAB — THYROID PANEL WITH TSH
Free Thyroxine Index: 2 (ref 1.2–4.9)
T3 Uptake Ratio: 25 % (ref 24–39)
T4, Total: 7.8 ug/dL (ref 4.5–12.0)
TSH: 3.67 u[IU]/mL (ref 0.450–4.500)

## 2016-01-22 LAB — CMP14+EGFR
ALT: 41 [IU]/L — ABNORMAL HIGH (ref 0–32)
AST: 33 [IU]/L (ref 0–40)
Albumin/Globulin Ratio: 1.2 (ref 1.2–2.2)
Albumin: 4.1 g/dL (ref 3.5–5.5)
Alkaline Phosphatase: 75 [IU]/L (ref 39–117)
BUN/Creatinine Ratio: 13 (ref 9–23)
BUN: 11 mg/dL (ref 6–24)
Bilirubin Total: 0.2 mg/dL (ref 0.0–1.2)
CO2: 25 mmol/L (ref 18–29)
Calcium: 9.5 mg/dL (ref 8.7–10.2)
Chloride: 99 mmol/L (ref 96–106)
Creatinine, Ser: 0.87 mg/dL (ref 0.57–1.00)
GFR calc Af Amer: 96 mL/min/{1.73_m2} (ref >59)
GFR calc non Af Amer: 83 mL/min/{1.73_m2} (ref >59)
Globulin, Total: 3.3 g/dL (ref 1.5–4.5)
Glucose: 81 mg/dL (ref 65–99)
Potassium: 4.3 mmol/L (ref 3.5–5.2)
Sodium: 139 mmol/L (ref 134–144)
Total Protein: 7.4 g/dL (ref 6.0–8.5)

## 2016-01-22 LAB — VITAMIN D 25 HYDROXY (VIT D DEFICIENCY, FRACTURES): Vit D, 25-Hydroxy: 20.4 ng/mL — ABNORMAL LOW (ref 30.0–100.0)

## 2016-02-04 ENCOUNTER — Ambulatory Visit: Payer: BC Managed Care – PPO | Admitting: Family

## 2016-02-09 ENCOUNTER — Ambulatory Visit (INDEPENDENT_AMBULATORY_CARE_PROVIDER_SITE_OTHER): Payer: BC Managed Care – PPO | Admitting: Family

## 2016-02-09 ENCOUNTER — Encounter: Payer: Self-pay | Admitting: Family

## 2016-02-09 VITALS — BP 121/84 | HR 68 | Temp 97.6°F | Ht 66.0 in | Wt 299.4 lb

## 2016-02-09 DIAGNOSIS — F329 Major depressive disorder, single episode, unspecified: Secondary | ICD-10-CM

## 2016-02-09 DIAGNOSIS — F32A Depression, unspecified: Secondary | ICD-10-CM

## 2016-02-09 DIAGNOSIS — I10 Essential (primary) hypertension: Secondary | ICD-10-CM

## 2016-02-09 DIAGNOSIS — F411 Generalized anxiety disorder: Secondary | ICD-10-CM

## 2016-02-09 NOTE — Progress Notes (Signed)
   Subjective:    Patient ID: Heather Hardin, female    DOB: [birth year], 40 y.o.   MRN: [id number]  Pt presents to the office today to recheck HTN and GAD. We added HCTZ 12.5 mg to her lisinopril. Pt's BP is at goal today. We also increased her lexapro to 20 mg from 10 mg. Pt states she is doing well.  Hypertension  This is a chronic problem. The current episode started more than 1 year ago. The problem has been resolved since onset. The problem is controlled. Pertinent negatives include no anxiety, headaches, malaise/fatigue, palpitations, peripheral edema or shortness of breath. Risk factors for coronary artery disease include obesity, post-menopausal state and sedentary lifestyle. Past treatments include ACE inhibitors and diuretics. There is no history of kidney disease, CAD/MI, CVA or heart failure.  Anxiety  Presents for follow-up visit. Patient reports no depressed mood, excessive worry, insomnia, malaise, nervous/anxious behavior, palpitations or shortness of breath. Symptoms occur occasionally.        Review of Systems  Constitutional: Negative for malaise/fatigue.  Respiratory: Negative for shortness of breath.   Cardiovascular: Negative for palpitations.  Neurological: Negative for headaches.  Psychiatric/Behavioral: The patient is not nervous/anxious and does not have insomnia.   All other systems reviewed and are negative.      Objective:   Physical Exam  Constitutional: She is oriented to person, place, and time. She appears well-developed and well-nourished. No distress.  HENT:  Head: Normocephalic.  Eyes: Pupils are equal, round, and reactive to light.  Neck: Normal range of motion. Neck supple. No thyromegaly present.  Cardiovascular: Normal rate, regular rhythm, normal heart sounds and intact distal pulses.   No murmur heard. Pulmonary/Chest: Effort normal and breath sounds normal. No respiratory distress. She has no wheezes.  Abdominal: Soft. Bowel sounds are  normal. She exhibits no distension. There is no tenderness.  Musculoskeletal: Normal range of motion. She exhibits no edema or tenderness.  Neurological: She is alert and oriented to person, place, and time.  Skin: Skin is warm and dry.  Psychiatric: She has a normal mood and affect. Her behavior is normal. Judgment and thought content normal.  Vitals reviewed.     BP 121/84   Pulse 68   Temp 97.6 F (36.4 C) (Oral)   Ht '5\' 6"'$  (1.676 m)   Wt 299 lb 6.4 oz (135.8 kg)   BMI 48.32 kg/m      Assessment & Plan:  1. GAD (generalized anxiety disorder) -Continue lexapro -Stress management  - BMP8+EGFR  2. Depression -Continue lexapro -Stress management  - BMP8+EGFR  3. Essential hypertension -Dash diet information given -Exercise encouraged - Stress Management  -Continue current meds -RTO in 6 months - BMP8+EGFR   Evelina Dun, FNP

## 2016-02-09 NOTE — Patient Instructions (Signed)
Hypertension Hypertension, commonly called high blood pressure, is when the force of blood pumping through your arteries is too strong. Your arteries are the blood vessels that carry blood from your heart throughout your body. A blood pressure reading consists of a higher number over a lower number, such as 110/72. The higher number (systolic) is the pressure inside your arteries when your heart pumps. The lower number (diastolic) is the pressure inside your arteries when your heart relaxes. Ideally you want your blood pressure below 120/80. Hypertension forces your heart to work harder to pump blood. Your arteries may become narrow or stiff. Having untreated or uncontrolled hypertension can cause heart attack, stroke, kidney disease, and other problems. RISK FACTORS Some risk factors for high blood pressure are controllable. Others are not.  Risk factors you cannot control include:   Race. You may be at higher risk if you are African American.  Age. Risk increases with age.  Gender. Men are at higher risk than women before age 45 years. After age 65, women are at higher risk than men. Risk factors you can control include:  Not getting enough exercise or physical activity.  Being overweight.  Getting too much fat, sugar, calories, or salt in your diet.  Drinking too much alcohol. SIGNS AND SYMPTOMS Hypertension does not usually cause signs or symptoms. Extremely high blood pressure (hypertensive crisis) may cause headache, anxiety, shortness of breath, and nosebleed. DIAGNOSIS To check if you have hypertension, your health care provider will measure your blood pressure while you are seated, with your arm held at the level of your heart. It should be measured at least twice using the same arm. Certain conditions can cause a difference in blood pressure between your right and left arms. A blood pressure reading that is higher than normal on one occasion does not mean that you need treatment. If  it is not clear whether you have high blood pressure, you may be asked to return on a different day to have your blood pressure checked again. Or, you may be asked to monitor your blood pressure at home for 1 or more weeks. TREATMENT Treating high blood pressure includes making lifestyle changes and possibly taking medicine. Living a healthy lifestyle can help lower high blood pressure. You may need to change some of your habits. Lifestyle changes may include:  Following the DASH diet. This diet is high in fruits, vegetables, and whole grains. It is low in salt, red meat, and added sugars.  Keep your sodium intake below 2,300 mg per day.  Getting at least 30-45 minutes of aerobic exercise at least 4 times per week.  Losing weight if necessary.  Not smoking.  Limiting alcoholic beverages.  Learning ways to reduce stress. Your health care provider may prescribe medicine if lifestyle changes are not enough to get your blood pressure under control, and if one of the following is true:  You are 18-59 years of age and your systolic blood pressure is above 140.  You are 60 years of age or older, and your systolic blood pressure is above 150.  Your diastolic blood pressure is above 90.  You have diabetes, and your systolic blood pressure is over 140 or your diastolic blood pressure is over 90.  You have kidney disease and your blood pressure is above 140/90.  You have heart disease and your blood pressure is above 140/90. Your personal target blood pressure may vary depending on your medical conditions, your age, and other factors. HOME CARE INSTRUCTIONS    Have your blood pressure rechecked as directed by your health care provider.   Take medicines only as directed by your health care provider. Follow the directions carefully. Blood pressure medicines must be taken as prescribed. The medicine does not work as well when you skip doses. Skipping doses also puts you at risk for  problems.  Do not smoke.   Monitor your blood pressure at home as directed by your health care provider. SEEK MEDICAL CARE IF:   You think you are having a reaction to medicines taken.  You have recurrent headaches or feel dizzy.  You have swelling in your ankles.  You have trouble with your vision. SEEK IMMEDIATE MEDICAL CARE IF:  You develop a severe headache or confusion.  You have unusual weakness, numbness, or feel faint.  You have severe chest or abdominal pain.  You vomit repeatedly.  You have trouble breathing. MAKE SURE YOU:   Understand these instructions.  Will watch your condition.  Will get help right away if you are not doing well or get worse.   This information is not intended to replace advice given to you by your health care provider. Make sure you discuss any questions you have with your health care provider.   Document Released: 05/02/2005 Document Revised: 09/16/2014 Document Reviewed: 02/22/2013 Elsevier Interactive Patient Education 2016 Elsevier Inc.  

## 2016-02-10 LAB — BMP8+EGFR
BUN/Creatinine Ratio: 12 (ref 9–23)
BUN: 10 mg/dL (ref 6–24)
CO2: 26 mmol/L (ref 18–29)
Calcium: 9.3 mg/dL (ref 8.7–10.2)
Chloride: 99 mmol/L (ref 96–106)
Creatinine, Ser: 0.86 mg/dL (ref 0.57–1.00)
GFR calc Af Amer: 97 mL/min/{1.73_m2} (ref >59)
GFR calc non Af Amer: 84 mL/min/{1.73_m2} (ref >59)
Glucose: 90 mg/dL (ref 65–99)
Potassium: 4 mmol/L (ref 3.5–5.2)
Sodium: 139 mmol/L (ref 134–144)

## 2016-03-23 ENCOUNTER — Other Ambulatory Visit: Payer: Self-pay | Admitting: Family

## 2016-07-26 ENCOUNTER — Other Ambulatory Visit: Payer: Self-pay | Admitting: Family

## 2016-09-12 ENCOUNTER — Encounter: Payer: Self-pay | Admitting: Family

## 2016-09-12 ENCOUNTER — Ambulatory Visit (INDEPENDENT_AMBULATORY_CARE_PROVIDER_SITE_OTHER): Payer: BC Managed Care – PPO | Admitting: Family

## 2016-09-12 VITALS — BP 149/107 | HR 80 | Temp 97.9°F | Ht 66.0 in | Wt 315.4 lb

## 2016-09-12 DIAGNOSIS — R0681 Apnea, not elsewhere classified: Secondary | ICD-10-CM

## 2016-09-12 DIAGNOSIS — I10 Essential (primary) hypertension: Secondary | ICD-10-CM

## 2016-09-12 DIAGNOSIS — R0683 Snoring: Secondary | ICD-10-CM | POA: Diagnosis not present

## 2016-09-12 DIAGNOSIS — N920 Excessive and frequent menstruation with regular cycle: Secondary | ICD-10-CM

## 2016-09-12 NOTE — Progress Notes (Signed)
   Subjective:    Patient ID: Heather Hardin, female    DOB: 07-Apr-1976, 41 y.o.   MRN: [id number]  HPI Pt presents to the office today to discuss sleep apnea. PT states her husband is worried because she is snoring and has become louder and has periods of apnea.  PT reports fatigue and unrested. Pt is morbid obese, but has never been diagnosed with OSA.    PT also complaining of menorrhagia that has become worse of the last 5 months. Pt reports heavy bleeding for 7 days with cramping lasting 4 days. PT states the pain is aching in lower abdomen and low back 8 out 10.   PT's BP is elevated today. Pt has not taken her medication in last few weeks.   Review of Systems  Constitutional: Positive for fatigue.  All other systems reviewed and are negative.      Objective:   Physical Exam  Constitutional: She is oriented to person, place, and time. She appears well-developed and well-nourished. No distress.  Morbid obese   HENT:  Head: Normocephalic.  Eyes: Pupils are equal, round, and reactive to light.  Neck: Normal range of motion. Neck supple. No thyromegaly present.  Cardiovascular: Normal rate, regular rhythm, normal heart sounds and intact distal pulses.   No murmur heard. Pulmonary/Chest: Effort normal and breath sounds normal. No respiratory distress. She has no wheezes.  Abdominal: Soft. Bowel sounds are normal. She exhibits no distension. There is no tenderness.  Musculoskeletal: Normal range of motion. She exhibits no edema or tenderness.  Neurological: She is alert and oriented to person, place, and time.  Skin: Skin is warm and dry.  Psychiatric: She has a normal mood and affect. Her behavior is normal. Judgment and thought content normal.  Vitals reviewed.     BP (!) 149/107   Pulse 80   Temp 97.9 F (36.6 C) (Oral)   Ht 5\' 6"  (1.676 m)   Wt (!) 315 lb 6.4 oz (143.1 kg)   BMI 50.91 kg/m      Assessment & Plan:  1. Snoring - Ambulatory referral to  Neurology  2. Apnea - Ambulatory referral to Neurology  3. Menorrhagia with regular cycle - Ambulatory referral to Obstetrics / Gynecology  4. Morbid obesity ([place]) - Ambulatory referral to Neurology   5. Essential hypertension    Weight loss discussed  Referral pending for sleep study Pt's BP is elevated- Pt to go and pick up her medication today RTO Prn and keep follow up appts! Evelina Dun, FNP

## 2016-09-12 NOTE — Patient Instructions (Signed)

## 2016-09-13 ENCOUNTER — Other Ambulatory Visit: Payer: Self-pay | Admitting: Family

## 2016-09-21 ENCOUNTER — Encounter: Payer: BC Managed Care – PPO | Admitting: Adult Health

## 2016-09-28 ENCOUNTER — Ambulatory Visit (INDEPENDENT_AMBULATORY_CARE_PROVIDER_SITE_OTHER): Payer: BC Managed Care – PPO | Admitting: Adult Health

## 2016-09-28 ENCOUNTER — Encounter: Payer: Self-pay | Admitting: Adult Health

## 2016-09-28 VITALS — BP 150/100 | HR 78 | Ht 67.0 in | Wt 312.0 lb

## 2016-09-28 DIAGNOSIS — N920 Excessive and frequent menstruation with regular cycle: Secondary | ICD-10-CM | POA: Insufficient documentation

## 2016-09-28 DIAGNOSIS — N946 Dysmenorrhea, unspecified: Secondary | ICD-10-CM | POA: Insufficient documentation

## 2016-09-28 NOTE — Progress Notes (Signed)
Subjective:     Patient ID: Heather Hardin, female   DOB: 08-May-1976, 41 y.o.   MRN: [id number]  HPI [name] is a 41 year old white female, married in complaining of heavy periods and painful periods.Periods used top be 3 days, now 7 and has to change super tampon at least every 2 hours, has messed up clothes. Has hypertension and is on meds.She had normal pap 08/22/14.She works at [employer] at [employer]. 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  PCP is C.Hawks at [facility].  Review of Systems Patient denies any headaches, hearing loss, fatigue, blurred vision, shortness of breath, chest pain, abdominal pain, problems with bowel movements, urination, or intercourse. No joint pain or mood swings.See HPI for positives.    Objective:   Physical Exam BP (!) 150/100 (BP Location: Left Arm, Patient Position: Sitting, Cuff Size: Large)   Pulse 78   Ht 5\' 7"  (1.702 m)   Wt (!) 312 lb (141.5 kg)   BMI 48.87 kg/m  Skin warm and dry. Neck: mid line trachea, normal thyroid, good ROM, no lymphadenopathy noted. Lungs: clear to ausculation bilaterally. Cardiovascular: regular rate and rhythm.   Abdomen is soft and non tender. Discussed getting [place] to assess uterus when school is out,  and also talked about IUD and ablation as options to control periods.Will check CBC today PHQ 2 score 0.  Assessment:     1. Menorrhagia with regular cycle   2. Dysmenorrhea       Plan:     Check CBC Return 6/12 for GYN US,will talk when results back Review handouts on menorrhagia and dysmenorrhea Review handouts on IUD and endometrial ablation

## 2016-09-28 NOTE — Patient Instructions (Signed)
Dysmenorrhea Menstrual cramps (dysmenorrhea) are caused by the muscles of the uterus tightening (contracting) during a menstrual period. For some women, this discomfort is merely bothersome. For others, dysmenorrhea can be severe enough to interfere with everyday activities for a few days each month. Primary dysmenorrhea is menstrual cramps that last a couple of days when you start having menstrual periods or soon after. This often begins after a teenager starts having her period. As a woman gets older or has a baby, the cramps will usually lessen or disappear. Secondary dysmenorrhea begins later in life, lasts longer, and the pain may be stronger than primary dysmenorrhea. The pain may start before the period and last a few days after the period. What are the causes? Dysmenorrhea is usually caused by an underlying problem, such as:  The tissue lining the uterus grows outside of the uterus in other areas of the body (endometriosis).  The endometrial tissue, which normally lines the uterus, is found in or grows into the muscular walls of the uterus (adenomyosis).  The pelvic blood vessels are engorged with blood just before the menstrual period (pelvic congestive syndrome).  Overgrowth of cells (polyps) in the lining of the uterus or cervix.  Falling down of the uterus (prolapse) because of loose or stretched ligaments.  Depression.  Bladder problems, infection, or inflammation.  Problems with the intestine, a tumor, or irritable bowel syndrome.  Cancer of the female organs or bladder.  A severely tipped uterus.  A very tight opening or closed cervix.  Noncancerous tumors of the uterus (fibroids).  Pelvic inflammatory disease (PID).  Pelvic scarring (adhesions) from a previous surgery.  Ovarian cyst.  An intrauterine device (IUD) used for birth control. What increases the risk? You may be at greater risk of dysmenorrhea if:  You are younger than age 95.  You started puberty  early.  You have irregular or heavy bleeding.  You have never given birth.  You have a family history of this problem.  You are a smoker. What are the signs or symptoms?  Cramping or throbbing pain in your lower abdomen.  Headaches.  Lower back pain.  Nausea or vomiting.  Diarrhea.  Sweating or dizziness.  Loose stools. How is this diagnosed? A diagnosis is based on your history, symptoms, physical exam, diagnostic tests, or procedures. Diagnostic tests or procedures may include:  Blood tests.  Ultrasonography.  An examination of the lining of the uterus (dilation and curettage, D&C).  An examination inside your abdomen or pelvis with a scope (laparoscopy).  X-rays.  CT scan.  MRI.  An examination inside the bladder with a scope (cystoscopy).  An examination inside the intestine or stomach with a scope (colonoscopy, gastroscopy). How is this treated? Treatment depends on the cause of the dysmenorrhea. Treatment may include:  Pain medicine prescribed by your health care provider.  Birth control pills or an IUD with progesterone hormone in it.  Hormone replacement therapy.  Nonsteroidal anti-inflammatory drugs (NSAIDs). These may help stop the production of prostaglandins.  Surgery to remove adhesions, endometriosis, ovarian cyst, or fibroids.  Removal of the uterus (hysterectomy).  Progesterone shots to stop the menstrual period.  Cutting the nerves on the sacrum that go to the female organs (presacral neurectomy).  Electric current to the sacral nerves (sacral nerve stimulation).  Antidepressant medicine.  Psychiatric therapy, counseling, or group therapy.  Exercise and physical therapy.  Meditation and yoga therapy.  Acupuncture. Follow these instructions at home:  Only take over-the-counter or prescription medicines as directed  by your health care provider.  Place a heating pad or hot water bottle on your lower back or abdomen. Do not  sleep with the heating pad.  Use aerobic exercises, walking, swimming, biking, and other exercises to help lessen the cramping.  Massage to the lower back or abdomen may help.  Stop smoking.  Avoid alcohol and caffeine. Contact a health care provider if:  Your pain does not get better with medicine.  You have pain with sexual intercourse.  Your pain increases and is not controlled with medicines.  You have abnormal vaginal bleeding with your period.  You develop nausea or vomiting with your period that is not controlled with medicine. Get help right away if: You pass out. This information is not intended to replace advice given to you by your health care provider. Make sure you discuss any questions you have with your health care provider. Document Released: 05/02/2005 Document Revised: 10/08/2015 Document Reviewed: 10/18/2012 Elsevier Interactive Patient Education  2017 [place]. Menorrhagia Menorrhagia is a condition in which menstrual periods are heavy or last longer than normal. With menorrhagia, most periods a woman has may cause enough blood loss and cramping that she becomes unable to take part in her usual activities. What are the causes? Common causes of this condition include:  Noncancerous growths in the uterus (polyps or fibroids).  An imbalance of the estrogen and progesterone hormones.  One of the ovaries not releasing an egg during one or more months.  A problem with the thyroid gland (hypothyroid).  Side effects of having an intrauterine device (IUD).  Side effects of some medicines, such as anti-inflammatory medicines or blood thinners.  A bleeding disorder that stops the blood from clotting normally. In some cases, the cause of this condition is not known. What are the signs or symptoms? Symptoms of this condition include:  Routinely having to change your pad or tampon every 1-2 hours because it is completely soaked.  Needing to use pads and  tampons at the same time because of heavy bleeding.  Needing to wake up to change your pads or tampons during the night.  Passing blood clots larger than 1 inch (2.5 cm) in size.  Having bleeding that lasts for more than 7 days.  Having symptoms of low iron levels (anemia), such as tiredness, fatigue, or shortness of breath. How is this diagnosed? This condition may be diagnosed based on:  A physical exam.  Your symptoms and menstrual history.  Tests, such as:  Blood tests to check if you are pregnant or have hormonal changes, a bleeding or thyroid disorder, anemia, or other problems.  Pap test to check for cancerous changes, infections, or inflammation.  Endometrial biopsy. This test involves removing a tissue sample from the lining of the uterus (endometrium) to be examined under a microscope.  Pelvic ultrasound. This test uses sound waves to create images of your uterus, ovaries, and vagina. The images can show if you have fibroids or other growths.  Hysteroscopy. For this test, a small telescope is used to look inside your uterus. How is this treated? Treatment may not be needed for this condition. If it is needed, the best treatment for you will depend on:  Whether you need to prevent pregnancy.  Your desire to have children in the future.  The cause and severity of your bleeding.  Your personal preference. Medicines are the first step in treatment. You may be treated with:  Hormonal birth control methods. These treatments reduce bleeding during  your menstrual period. They include:  Birth control pills.  Skin patch.  Vaginal ring.  Shots (injections) that you get every 3 months.  Hormonal IUD (intrauterine device).  Implants that go under the skin.  Medicines that thicken blood and slow bleeding.  Medicines that reduce swelling, such as ibuprofen.  Medicines that contain an artificial (synthetic) hormone called progestin.  Medicines that make the  ovaries stop working for a short time.  Iron supplements to treat anemia. If medicines do not work, surgery may be done. Surgical options may include:  Dilation and curettage (D&C). In this procedure, your health care provider opens (dilates) your cervix and then scrapes or suctions tissue from the endometrium to reduce menstrual bleeding.  Operative hysteroscopy. In this procedure, a small tube with a light on the end (hysteroscope) is used to view your uterus and help remove polyps that may be causing heavy periods.  Endometrial ablation. This is when various techniques are used to permanently destroy your entire endometrium. After endometrial ablation, most women have little or no menstrual flow. This procedure reduces your ability to become pregnant.  Endometrial resection. In this procedure, an electrosurgical wire loop is used to remove the endometrium. This procedure reduces your ability to become pregnant.  Hysterectomy. This is surgical removal of the uterus. This is a permanent procedure that stops menstrual periods. Pregnancy is not possible after a hysterectomy. Follow these instructions at home: Medicines   Take over-the-counter and prescription medicines exactly as told by your health care provider. This includes iron pills.  Do not change or switch medicines without asking your health care provider.  Do not take aspirin or medicines that contain aspirin 1 week before or during your menstrual period. Aspirin may make bleeding worse. General instructions   If you need to change your sanitary pad or tampon more than once every 2 hours, limit your activity until the bleeding stops.  Iron pills can cause constipation. To prevent or treat constipation while you are taking prescription iron supplements, your health care provider may recommend that you:  Drink enough fluid to keep your urine clear or pale yellow.  Take over-the-counter or prescription medicines.  Eat foods that  are high in fiber, such as fresh fruits and vegetables, whole grains, and beans.  Limit foods that are high in fat and processed sugars, such as fried and sweet foods.  Eat well-balanced meals, including foods that are high in iron. Foods that have a lot of iron include leafy green vegetables, meat, liver, eggs, and whole grain breads and cereals.  Do not try to lose weight until the abnormal bleeding has stopped and your blood iron level is back to normal. If you need to lose weight, work with your health care provider to lose weight safely.  Keep all follow-up visits as told by your health care provider. This is important. Contact a health care provider if:  You soak through a pad or tampon every 1 or 2 hours, and this happens every time you have a period.  You need to use pads and tampons at the same time because you are bleeding so much.  You have nausea, vomiting, diarrhea, or other problems related to medicines you are taking. Get help right away if:  You soak through more than a pad or tampon in 1 hour.  You pass clots bigger than 1 inch (2.5 cm) wide.  You feel short of breath.  You feel like your heart is beating too fast.  You feel  dizzy or faint.  You feel very weak or tired. Summary  Menorrhagia is a condition in which menstrual periods are heavy or last longer than normal.  Treatment will depend on the cause of the condition and may include medicines or procedures.  Take over-the-counter and prescription medicines exactly as told by your health care provider. This includes iron pills.  Get help right away if you have heavy bleeding that soaks through more than a pad or tampon in 1 hour, you are passing large clots, or you feel dizzy, faint or short of breath. This information is not intended to replace advice given to you by your health care provider. Make sure you discuss any questions you have with your health care provider. Document Released: 05/02/2005 Document  Revised: 04/25/2016 Document Reviewed: 04/25/2016 Elsevier Interactive Patient Education  2017 [employer].

## 2016-09-29 LAB — CBC
Hematocrit: 44.4 % (ref 34.0–46.6)
Hemoglobin: 14.3 g/dL (ref 11.1–15.9)
MCH: 30 pg (ref 26.6–33.0)
MCHC: 32.2 g/dL (ref 31.5–35.7)
MCV: 93 fL (ref 79–97)
Platelets: 316 10*3/uL (ref 150–379)
RBC: 4.77 x10E6/uL (ref 3.77–5.28)
RDW: 13.9 % (ref 12.3–15.4)
WBC: 7.7 10*3/uL (ref 3.4–10.8)

## 2016-09-30 ENCOUNTER — Telehealth: Payer: Self-pay | Admitting: Adult Health

## 2016-09-30 NOTE — Telephone Encounter (Signed)
No voice mail.

## 2016-10-03 ENCOUNTER — Ambulatory Visit (INDEPENDENT_AMBULATORY_CARE_PROVIDER_SITE_OTHER): Payer: BC Managed Care – PPO | Admitting: Pediatrics

## 2016-10-03 ENCOUNTER — Encounter: Payer: Self-pay | Admitting: Pediatrics

## 2016-10-03 VITALS — BP 126/88 | HR 86 | Temp 97.4°F | Ht 67.0 in | Wt 311.0 lb

## 2016-10-03 DIAGNOSIS — J069 Acute upper respiratory infection, unspecified: Secondary | ICD-10-CM | POA: Diagnosis not present

## 2016-10-03 DIAGNOSIS — J40 Bronchitis, not specified as acute or chronic: Secondary | ICD-10-CM

## 2016-10-03 DIAGNOSIS — J029 Acute pharyngitis, unspecified: Secondary | ICD-10-CM

## 2016-10-03 LAB — CULTURE, GROUP A STREP

## 2016-10-03 LAB — RAPID STREP SCREEN (MED CTR MEBANE ONLY): Strep Gp A Ag, IA W/Reflex: NEGATIVE

## 2016-10-03 MED ORDER — AZITHROMYCIN 250 MG PO TABS: ORAL_TABLET | ORAL | 0 refills | Status: DC

## 2016-10-03 MED ORDER — ALBUTEROL SULFATE HFA 108 (90 BASE) MCG/ACT IN AERS: 2.0000 | INHALATION_SPRAY | Freq: Four times a day (QID) | RESPIRATORY_TRACT | 0 refills | Status: DC | PRN

## 2016-10-03 MED ORDER — SPACER/AERO CHAMBER MOUTHPIECE MISC: 1.0000 | Freq: Four times a day (QID) | 0 refills | Status: DC | PRN

## 2016-10-03 NOTE — Patient Instructions (Addendum)
Netipot with distilled water 2-3 times a day to clear out sinuses Or Normal saline nasal spray Flonase steroid nasal spray Antihistamine daily such as cetirizine Lots of fluids  If not improving over next few days, start antibiotic.

## 2016-10-03 NOTE — Progress Notes (Signed)
  Subjective:   Patient ID: Heather Hardin, female    DOB: February 03, 1976, 41 y.o.   MRN: [id number] CC: Cough (chest tight - ribs hurts) and Sore Throat  HPI: Heather Hardin is a 41 y.o. female presenting for Cough (chest tight - ribs hurts) and Sore Throat  Started end of last week/3-4 days ago Today feels much worse than yesterday Cough bothers her the most Feels some tightness with deep breaths Has had to be on albuterol in the past    Relevant past medical, surgical, family and social history reviewed. Allergies and medications reviewed and updated. History  Smoking Status  . Former Smoker  . Packs/day: 1.00  . Types: Cigarettes  . Start date: 05/17/1991  . Quit date: 05/17/1995  Smokeless Tobacco  . Never Used   ROS: Per HPI   Objective:    BP 126/88 (BP Location: Left Wrist, Cuff Size: Normal)   Pulse 86   Temp 97.4 F (36.3 C) (Oral)   Ht 5\' 7"  (1.702 m)   Wt (!) 311 lb (141.1 kg)   LMP 09/12/2016   BMI 48.71 kg/m   Wt Readings from Last 3 Encounters:  10/03/16 (!) 311 lb (141.1 kg)  09/28/16 (!) 312 lb (141.5 kg)  09/12/16 (!) 315 lb 6.4 oz (143.1 kg)    Gen: NAD, alert, cooperative with exam, NCAT, barky dry cough EYES: EOMI, no conjunctival injection, or no icterus ENT:  TMs dull gray b/l, OP with no erythema LYMPH: no cervical LAD CV: NRRR, normal S1/S2, no murmur Resp: CTABL, moving air well, normal WOB Ext: No edema, warm Neuro: Alert and oriented MSK: normal muscle bulk  Assessment & Plan:  [name] was seen today for cough and sore throat.  Diagnoses and all orders for this visit: Acute URI Discussed symptom care  Bronchitis Start albuterol, if cough not improving OK to start antibitoic -     azithromycin (ZITHROMAX) 250 MG tablet; Take 2 the first day and then one each day after. -     albuterol (PROVENTIL HFA;VENTOLIN HFA) 108 (90 Base) MCG/ACT inhaler; Inhale 2 puffs into the lungs every 6 (six) hours as needed for wheezing or shortness of  breath. -     Spacer/Aero Chamber Mouthpiece MISC; 1 each by Does not apply route every 6 (six) hours as needed.  Sore throat Rapid neg -     Rapid strep screen (not at [facility])  Other orders -     Culture, Group A Strep   Follow up plan: Return if symptoms worsen or fail to improve. Assunta Found, MD [place]

## 2016-10-19 ENCOUNTER — Other Ambulatory Visit: Payer: Self-pay | Admitting: Family

## 2016-10-19 DIAGNOSIS — F411 Generalized anxiety disorder: Secondary | ICD-10-CM

## 2016-10-19 DIAGNOSIS — F32A Depression, unspecified: Secondary | ICD-10-CM

## 2016-10-19 DIAGNOSIS — F329 Major depressive disorder, single episode, unspecified: Secondary | ICD-10-CM

## 2016-10-20 ENCOUNTER — Institutional Professional Consult (permissible substitution): Payer: BC Managed Care – PPO | Admitting: Neurology

## 2016-10-26 ENCOUNTER — Other Ambulatory Visit: Payer: BC Managed Care – PPO

## 2016-11-01 ENCOUNTER — Telehealth: Payer: Self-pay | Admitting: Adult Health

## 2016-11-01 ENCOUNTER — Ambulatory Visit (INDEPENDENT_AMBULATORY_CARE_PROVIDER_SITE_OTHER): Payer: BC Managed Care – PPO

## 2016-11-01 DIAGNOSIS — N920 Excessive and frequent menstruation with regular cycle: Secondary | ICD-10-CM | POA: Diagnosis not present

## 2016-11-01 DIAGNOSIS — N946 Dysmenorrhea, unspecified: Secondary | ICD-10-CM | POA: Diagnosis not present

## 2016-11-01 NOTE — Telephone Encounter (Signed)
Pt aware that [place] looks normal, thinks she might want an ablation, will make appt with Dr Elonda Husky

## 2016-11-01 NOTE — Progress Notes (Signed)
PELVIC US TA/TV: homogeneous anteverted uterus,wnl,normal ovaries bilat(limited view of ovaries),EEC 13.9 mm,no free fluid,no pain during ultrasound,ovaries appear mobile

## 2016-11-11 ENCOUNTER — Ambulatory Visit (INDEPENDENT_AMBULATORY_CARE_PROVIDER_SITE_OTHER): Payer: BC Managed Care – PPO | Admitting: Obstetrics & Gynecology

## 2016-11-11 ENCOUNTER — Encounter: Payer: Self-pay | Admitting: Obstetrics & Gynecology

## 2016-11-11 VITALS — BP 138/84 | HR 96 | Ht 67.0 in | Wt 310.0 lb

## 2016-11-11 DIAGNOSIS — N920 Excessive and frequent menstruation with regular cycle: Secondary | ICD-10-CM | POA: Diagnosis not present

## 2016-11-11 DIAGNOSIS — N946 Dysmenorrhea, unspecified: Secondary | ICD-10-CM

## 2016-11-11 MED ORDER — MEGESTROL ACETATE 40 MG PO TABS: ORAL_TABLET | ORAL | 3 refills | Status: DC

## 2016-11-11 NOTE — Progress Notes (Signed)
Preoperative History and Physical  Heather Hardin is a 41 y.o. [phone] with Patient's last menstrual period was 10/16/2016 (approximate). admitted for a hysteroscopy and uterine curettage for menometrorrhagia and dysmenorrhea   Originally the patient's all [name] in 09/28/2016 for heavy periods and painful periods They used to be 3 days but now they're 7 and she has a change super tampon at least every 2 hours she soils her closed and her  Sheets.  She was placed on megestrol therapy  Subsequently sonogram was ordered and reveals a 13.9 mm endometrial stripe which was homogeneous and no pathology consistent with a fibroid or a polyp  As a result she needs formal evaluation and definitive management with the endometrial ablation  PMH:    Past Medical History:  Diagnosis Date  . Hypertension   . Thyroid disease     PSH:     Past Surgical History:  Procedure Laterality Date  . [place], 2004  . TUBAL LIGATION  2004    POb/GynH:      OB History    Gravida Para Term Preterm AB Living   4 3     1 3    SAB TAB Ectopic Multiple Live Births                  SH:   Social History  Substance Use Topics  . Smoking status: Former Smoker    Packs/day: 1.00    Types: Cigarettes    Start date: 05/17/1991    Quit date: 05/17/1995  . Smokeless tobacco: Never Used  . Alcohol use 0.0 oz/week     Comment: rare    FH:    Family History  Problem Relation Age of Onset  . Cancer Father        throat  . Hypertension Mother   . COPD Mother   . HIV Mother   . Heart failure Maternal Grandfather   . Heart failure Maternal Grandmother   . Stroke Maternal Grandmother   . Hypertension Maternal Grandmother   . Osteoarthritis Maternal Grandmother   . Diabetes Sister      Allergies: No Known Allergies  Medications:       Current Outpatient Prescriptions:  .  albuterol (PROVENTIL HFA;VENTOLIN HFA) 108 (90 Base) MCG/ACT inhaler, Inhale 2 puffs into the lungs  every 6 (six) hours as needed for wheezing or shortness of breath., Disp: 1 Inhaler, Rfl: 0 .  escitalopram (LEXAPRO) 20 MG tablet, TAKE ONE TABLET BY MOUTH ONCE DAILY, Disp: 30 tablet, Rfl: 1 .  ibuprofen (ADVIL,MOTRIN) 800 MG tablet, Take 800 mg by mouth every 8 (eight) hours as needed., Disp: , Rfl:  .  levothyroxine (SYNTHROID, LEVOTHROID) 50 MCG tablet, TAKE ONE TABLET BY MOUTH ONCE DAILY, Disp: 90 tablet, Rfl: 1 .  lisinopril-hydrochlorothiazide (ZESTORETIC) 20-12.5 MG tablet, Take 1 tablet by mouth daily., Disp: 90 tablet, Rfl: 3 .  Multiple Vitamins-Minerals (MULTIVITAMIN WITH MINERALS) tablet, Take 1 tablet by mouth daily., Disp: , Rfl:  .  Omega-3 Fatty Acids (FISH OIL BURP-LESS PO), Take by mouth., Disp: , Rfl:  .  Spacer/Aero Chamber Mouthpiece MISC, 1 each by Does not apply route every 6 (six) hours as needed., Disp: 1 each, Rfl: 0 .  Vitamin D, Ergocalciferol, (DRISDOL) 50000 units CAPS capsule, TAKE ONE CAPSULE BY MOUTH ONCE A WEEK, Disp: 12 capsule, Rfl: 0  Review of Systems:   Review of Systems  Constitutional: Negative for fever, chills, weight loss, malaise/fatigue and diaphoresis.  HENT: Negative  for hearing loss, ear pain, nosebleeds, congestion, sore throat, neck pain, tinnitus and ear discharge.   Eyes: Negative for blurred vision, double vision, photophobia, pain, discharge and redness.  Respiratory: Negative for cough, hemoptysis, sputum production, shortness of breath, wheezing and stridor.   Cardiovascular: Negative for chest pain, palpitations, orthopnea, claudication, leg swelling and PND.  Gastrointestinal: Positive for abdominal pain. Negative for heartburn, nausea, vomiting, diarrhea, constipation, blood in stool and melena.  Genitourinary: Negative for dysuria, urgency, frequency, hematuria and flank pain.  Musculoskeletal: Negative for myalgias, back pain, joint pain and falls.  Skin: Negative for itching and rash.  Neurological: Negative for dizziness,  tingling, tremors, sensory change, speech change, focal weakness, seizures, loss of consciousness, weakness and headaches.  Endo/Heme/Allergies: Negative for environmental allergies and polydipsia. Does not bruise/bleed easily.  Psychiatric/Behavioral: Negative for depression, suicidal ideas, hallucinations, memory loss and substance abuse. The patient is not nervous/anxious and does not have insomnia.      PHYSICAL EXAM:  Blood pressure 138/84, pulse 96, height 5\' 7"  (1.702 m), weight (!) 310 lb (140.6 kg), last menstrual period 10/16/2016.    Vitals reviewed. Constitutional: She is oriented to person, place, and time. She appears well-developed and well-nourished.  HENT:  Head: Normocephalic and atraumatic.  Right Ear: External ear normal.  Left Ear: External ear normal.  Nose: Nose normal.  Mouth/Throat: Oropharynx is clear and moist.  Eyes: Conjunctivae and EOM are normal. Pupils are equal, round, and reactive to light. Right eye exhibits no discharge. Left eye exhibits no discharge. No scleral icterus.  Neck: Normal range of motion. Neck supple. No tracheal deviation present. No thyromegaly present.  Cardiovascular: Normal rate, regular rhythm, normal heart sounds and intact distal pulses.  Exam reveals no gallop and no friction rub.   No murmur heard. Respiratory: Effort normal and breath sounds normal. No respiratory distress. She has no wheezes. She has no rales. She exhibits no tenderness.  GI: Soft. Bowel sounds are normal. She exhibits no distension and no mass. There is tenderness. There is no rebound and no guarding.  Genitourinary:       Vulva is normal without lesions Vagina is pink moist without discharge Cervix normal in appearance and pap is normal Uterus is not examined Adnexa is negative with normal sized ovaries by sonogram  Musculoskeletal: Normal range of motion. She exhibits no edema and no tenderness.  Neurological: She is alert and oriented to person, place,  and time. She has normal reflexes. She displays normal reflexes. No cranial nerve deficit. She exhibits normal muscle tone. Coordination normal.  Skin: Skin is warm and dry. No rash noted. No erythema. No pallor.  Psychiatric: She has a normal mood and affect. Her behavior is normal. Judgment and thought content normal.    Labs: No results found for this or any previous visit (from the past 336 hour(s)).  EKG: No orders found for this or any previous visit.  Imaging Studies: US Transvaginal Non-ob  Result Date: 11/01/2016 GYNECOLOGIC SONOGRAM Rael Tilly is a 41 y.o. [id number] LMP 10/16/2016 for a pelvic sonogram for menorrhagia. Uterus                      9.6 x 5.4 x 5.8 cm,  homogeneous anteverted uterus,wnl Endometrium          13.9 mm, symmetrical, wnl Right ovary             2.8 x 2.4 x 2.9 cm, wnl  Limited view Left ovary  3.0 x 2.3 x 3.6 cm, wnl   Limited view No free fluid ,mult.small nabothian cysts Technician Comments: PELVIC US TA/TV: homogeneous anteverted uterus,wnl,normal ovaries bilat(limited view of ovaries),EEC 13.9 mm,no free fluid,no pain during ultrasound,ovaries appear mobile [employer] 11/01/2016 2:57 PM Clinical Impression and recommendations: I have reviewed the sonogram results above, combined with the patient's current clinical course, below are my impressions and any appropriate recommendations for management based on the sonographic findings. Uterus is normal with normal endometrium for the secretory phase of a menstruating woman Both ovaries are normal No anatomic etiology for the menorrhagia is noted Ramello Cordial H 11/01/2016 3:50 PM   US Pelvis Complete  Result Date: 11/01/2016 GYNECOLOGIC SONOGRAM Ashni Lonzo is a 41 y.o. [id number] LMP 10/16/2016 for a pelvic sonogram for menorrhagia. Uterus                      9.6 x 5.4 x 5.8 cm,  homogeneous anteverted uterus,wnl Endometrium          13.9 mm, symmetrical, wnl Right ovary             2.8 x 2.4 x  2.9 cm, wnl  Limited view Left ovary                3.0 x 2.3 x 3.6 cm, wnl   Limited view No free fluid ,mult.small nabothian cysts Technician Comments: PELVIC US TA/TV: homogeneous anteverted uterus,wnl,normal ovaries bilat(limited view of ovaries),EEC 13.9 mm,no free fluid,no pain during ultrasound,ovaries appear mobile [employer] 11/01/2016 2:57 PM Clinical Impression and recommendations: I have reviewed the sonogram results above, combined with the patient's current clinical course, below are my impressions and any appropriate recommendations for management based on the sonographic findings. Uterus is normal with normal endometrium for the secretory phase of a menstruating woman Both ovaries are normal No anatomic etiology for the menorrhagia is noted Sydney Hasten H 11/01/2016 3:50 PM      Assessment: Patient Active Problem List   Diagnosis Date Noted  . Menorrhagia with regular cycle 09/28/2016  . Dysmenorrhea 09/28/2016  . Metabolic syndrome 82/50/[id number]  . Hypertriglyceridemia 06/18/2015  . Depression 03/11/2015  . GAD (generalized anxiety disorder) 03/11/2015  . Essential hypertension 09/08/2014  . Hypothyroidism 08/25/2014  . Vitamin D deficiency 08/25/2014    Plan: Preoperative megestrol therapy   Hysteroscopy uterine curettage [facility] endometrial ablation Friday, 7/27/208@0730   Cayli Escajeda H 11/11/2016 10:33 AM     Face to face time:  25 minutes  Greater than 50% of the visit time was spent in counseling and coordination of care with the patient.  The summary and outline of the counseling and care coordination is summarized in the note above.   All questions were answered.

## 2016-11-15 ENCOUNTER — Institutional Professional Consult (permissible substitution): Payer: BC Managed Care – PPO | Admitting: Neurology

## 2016-11-28 ENCOUNTER — Other Ambulatory Visit: Payer: Self-pay | Admitting: Family

## 2016-11-29 NOTE — Telephone Encounter (Signed)
last Vit D 01/21/16  20.4

## 2016-12-05 NOTE — Patient Instructions (Signed)
Heather Hardin  12/05/2016     @PREFPERIOPPHARMACY @   Your procedure is scheduled on  12/09/2016   Report to [facility] at  615  A.M.  Call this number if you have problems the morning of surgery:  [phone]   Remember:  Do not eat food or drink liquids after midnight.  Take these medicines the morning of surgery with A SIP OF WATER  Lexapro, levothyroxine, lisinopril. Use your inhaler before you come.   Do not wear jewelry, make-up or nail polish.  Do not wear lotions, powders, or perfumes, or deoderant.  Do not shave 48 hours prior to surgery.  Men may shave face and neck.  Do not bring valuables to the hospital.  [facility] is not responsible for any belongings or valuables.  Contacts, dentures or bridgework may not be worn into surgery.  Leave your suitcase in the car.  After surgery it may be brought to your room.  For patients admitted to the hospital, discharge time will be determined by your treatment team.  Patients discharged the day of surgery will not be allowed to drive home.   Name and phone number of your driver:   family Special instructions:  None  Please read over the following fact sheets that you were given. Anesthesia Post-op Instructions and Care and Recovery After Surgery      Hysteroscopy Hysteroscopy is a procedure used for looking inside the womb (uterus). It may be done for various reasons, including:  To evaluate abnormal bleeding, fibroid (benign, noncancerous) tumors, polyps, scar tissue (adhesions), and possibly cancer of the uterus.  To look for lumps (tumors) and other uterine growths.  To look for causes of why a woman cannot get pregnant (infertility), causes of recurrent loss of pregnancy (miscarriages), or a lost intrauterine device (IUD).  To perform a sterilization by blocking the fallopian tubes from inside the uterus.  In this procedure, a thin, flexible tube with a tiny light and camera on the  end of it (hysteroscope) is used to look inside the uterus. A hysteroscopy should be done right after a menstrual period to be sure you are not pregnant. LET [facility] CARE PROVIDER KNOW ABOUT:  Any allergies you have.  All medicines you are taking, including vitamins, herbs, eye drops, creams, and over-the-counter medicines.  Previous problems you or members of your family have had with the use of anesthetics.  Any blood disorders you have.  Previous surgeries you have had.  Medical conditions you have. RISKS AND COMPLICATIONS Generally, this is a safe procedure. However, as with any procedure, complications can occur. Possible complications include:  Putting a hole in the uterus.  Excessive bleeding.  Infection.  Damage to the cervix.  Injury to other organs.  Allergic reaction to medicines.  Too much fluid used in the uterus for the procedure.  BEFORE THE PROCEDURE  Ask your health care provider about changing or stopping any regular medicines.  Do not take aspirin or blood thinners for 1 week before the procedure, or as directed by your health care provider. These can cause bleeding.  If you smoke, do not smoke for 2 weeks before the procedure.  In some cases, a medicine is placed in the cervix the day before the procedure. This medicine makes the cervix have a larger opening (dilate). This makes it easier for the instrument to be inserted into the uterus during the procedure.  Do not eat or drink anything for at least 8 hours before the surgery.  Arrange for someone to take you home after the procedure. PROCEDURE  You may be given a medicine to relax you (sedative). You may also be given one of the following: ? A medicine that numbs the area around the cervix (local anesthetic). ? A medicine that makes you sleep through the procedure (general anesthetic).  The hysteroscope is inserted through the vagina into the uterus. The camera on the hysteroscope sends a  picture to a TV screen. This gives the surgeon a good view inside the uterus.  During the procedure, air or a liquid is put into the uterus, which allows the surgeon to see better.  Sometimes, tissue is gently scraped from inside the uterus. These tissue samples are sent to a lab for testing. What to expect after the procedure  If you had a general anesthetic, you may be groggy for a couple hours after the procedure.  If you had a local anesthetic, you will be able to go home as soon as you are stable and feel ready.  You may have some cramping. This normally lasts for a couple days.  You may have bleeding, which varies from light spotting for a few days to menstrual-like bleeding for 3-7 days. This is normal.  If your test results are not back during the visit, make an appointment with your health care provider to find out the results. This information is not intended to replace advice given to you by your health care provider. Make sure you discuss any questions you have with your health care provider. Document Released: 08/08/2000 Document Revised: 10/08/2015 Document Reviewed: 11/29/2012 Elsevier Interactive Patient Education  2017 [place]. Hysteroscopy, Care After Refer to this sheet in the next few weeks. These instructions provide you with information on caring for yourself after your procedure. Your health care provider may also give you more specific instructions. Your treatment has been planned according to current medical practices, but problems sometimes occur. Call your health care provider if you have any problems or questions after your procedure. What can I expect after the procedure? After your procedure, it is typical to have the following:  You may have some cramping. This normally lasts for a couple days.  You may have bleeding. This can vary from light spotting for a few days to menstrual-like bleeding for 3-7 days.  Follow these instructions at home:  Rest  for the first 1-2 days after the procedure.  Only take over-the-counter or prescription medicines as directed by your health care provider. Do not take aspirin. It can increase the chances of bleeding.  Take showers instead of baths for 2 weeks or as directed by your health care provider.  Do not drive for 24 hours or as directed.  Do not drink alcohol while taking pain medicine.  Do not use tampons, douche, or have sexual intercourse for 2 weeks or until your health care provider says it is okay.  Take your temperature twice a day for 4-5 days. Write it down each time.  Follow your health care provider's advice about diet, exercise, and lifting.  If you develop constipation, you may: ? Take a mild laxative if your health care provider approves. ? Add bran foods to your diet. ? Drink enough fluids to keep your urine clear or pale yellow.  Try to have someone with you or available to you for the first 24-48 hours, especially if you were given a  general anesthetic.  Follow up with your health care provider as directed. Contact a health care provider if:  You feel dizzy or lightheaded.  You feel sick to your stomach (nauseous).  You have abnormal vaginal discharge.  You have a rash.  You have pain that is not controlled with medicine. Get help right away if:  You have bleeding that is heavier than a normal menstrual period.  You have a fever.  You have increasing cramps or pain, not controlled with medicine.  You have new belly (abdominal) pain.  You pass out.  You have pain in the tops of your shoulders (shoulder strap areas).  You have shortness of breath. This information is not intended to replace advice given to you by your health care provider. Make sure you discuss any questions you have with your health care provider. Document Released: 02/20/2013 Document Revised: 10/08/2015 Document Reviewed: 11/29/2012 Elsevier Interactive Patient Education  2017 [place].  Endometrial Ablation Endometrial ablation is a procedure that destroys the thin inner layer of the lining of the uterus (endometrium). This procedure may be done:  To stop heavy periods.  To stop bleeding that is causing anemia.  To control irregular bleeding.  To treat bleeding caused by small tumors (fibroids) in the endometrium.  This procedure is often an alternative to major surgery, such as removal of the uterus and cervix (hysterectomy). As a result of this procedure:  You may not be able to have children. However, if you are premenopausal (you have not gone through menopause): ? You may still have a small chance of getting pregnant. ? You will need to use a reliable method of birth control after the procedure to prevent pregnancy.  You may stop having a menstrual period, or you may have only a small amount of bleeding during your period. Menstruation may return several years after the procedure.  Tell a health care provider about:  Any allergies you have.  All medicines you are taking, including vitamins, herbs, eye drops, creams, and over-the-counter medicines.  Any problems you or family members have had with the use of anesthetic medicines.  Any blood disorders you have.  Any surgeries you have had.  Any medical conditions you have. What are the risks? Generally, this is a safe procedure. However, problems may occur, including:  A hole (perforation) in the uterus or bowel.  Infection of the uterus, bladder, or vagina.  Bleeding.  Damage to other structures or organs.  An air bubble in the lung (air embolus).  Problems with pregnancy after the procedure.  Failure of the procedure.  Decreased ability to diagnose cancer in the endometrium.  What happens before the procedure?  You will have tests of your endometrium to make sure there are no pre-cancerous cells or cancer cells present.  You may have an ultrasound of the uterus.  You may be given  medicines to thin the endometrium.  Ask your health care provider about: ? Changing or stopping your regular medicines. This is especially important if you take diabetes medicines or blood thinners. ? Taking medicines such as aspirin and ibuprofen. These medicines can thin your blood. Do not take these medicines before your procedure if your doctor tells you not to.  Plan to have someone take you home from the hospital or clinic. What happens during the procedure?  You will lie on an exam table with your feet and legs supported as in a pelvic exam.  To lower your risk of infection: ? Your  health care team will wash or sanitize their hands and put on germ-free (sterile) gloves. ? Your genital area will be washed with soap.  An IV tube will be inserted into one of your veins.  You will be given a medicine to help you relax (sedative).  A surgical instrument with a light and camera (resectoscope) will be inserted into your vagina and moved into your uterus. This allows your surgeon to see inside your uterus.  Endometrial tissue will be removed using one of the following methods: ? Radiofrequency. This method uses a radiofrequency-alternating electric current to remove the endometrium. ? Cryotherapy. This method uses extreme cold to freeze the endometrium. ? Heated-free liquid. This method uses a heated saltwater (saline) solution to remove the endometrium. ? Microwave. This method uses high-energy microwaves to heat up the endometrium and remove it. ? Thermal balloon. This method involves inserting a catheter with a balloon tip into the uterus. The balloon tip is filled with heated fluid to remove the endometrium. The procedure may vary among health care providers and hospitals. What happens after the procedure?  Your blood pressure, heart rate, breathing rate, and blood oxygen level will be monitored until the medicines you were given have worn off.  As tissue healing occurs, you may  notice vaginal bleeding for 4-6 weeks after the procedure. You may also experience: ? Cramps. ? Thin, watery vaginal discharge that is light pink or brown in color. ? A need to urinate more frequently than usual. ? Nausea.  Do not drive for 24 hours if you were given a sedative.  Do not have sex or insert anything into your vagina until your health care provider approves. Summary  Endometrial ablation is done to treat the many causes of heavy menstrual bleeding.  The procedure may be done only after medications have been tried to control the bleeding.  Plan to have someone take you home from the hospital or clinic. This information is not intended to replace advice given to you by your health care provider. Make sure you discuss any questions you have with your health care provider. Document Released: 03/11/2004 Document Revised: 05/19/2016 Document Reviewed: 05/19/2016 Elsevier Interactive Patient Education  2017 [employer] Inc.  Dilation and Curettage or Vacuum Curettage Dilation and curettage (D&C) and vacuum curettage are minor procedures. A D&C involves stretching (dilation) the cervix and scraping (curettage) the inside lining of the uterus (endometrium). During a D&C, tissue is gently scraped from the endometrium, starting from the top portion of the uterus down to the lowest part of the uterus (cervix). During a vacuum curettage, the lining and tissue in the uterus are removed with the use of gentle suction. Curettage may be performed to either diagnose or treat a problem. As a diagnostic procedure, curettage is performed to examine tissues from the uterus. A diagnostic curettage may be done if you have:  Irregular bleeding in the uterus.  Bleeding with the development of clots.  Spotting between menstrual periods.  Prolonged menstrual periods or other abnormal bleeding.  Bleeding after menopause.  No menstrual period (amenorrhea).  A change in size and shape of the  uterus.  Abnormal endometrial cells discovered during a Pap test.  As a treatment procedure, curettage may be performed for the following reasons:  Removal of an IUD (intrauterine device).  Removal of retained placenta after giving birth.  Abortion.  Miscarriage.  Removal of endometrial polyps.  Removal of uncommon types of noncancerous lumps (fibroids).  Tell a health care provider about:  Any allergies you have, including allergies to prescribed medicine or latex.  All medicines you are taking, including vitamins, herbs, eye drops, creams, and over-the-counter medicines. This is especially important if you take any blood-thinning medicine. Bring a list of all of your medicines to your appointment.  Any problems you or family members have had with anesthetic medicines.  Any blood disorders you have.  Any surgeries you have had.  Your medical history and any medical conditions you have.  Whether you are pregnant or may be pregnant.  Recent vaginal infections you have had.  Recent menstrual periods, bleeding problems you have had, and what form of birth control (contraception) you use. What are the risks? Generally, this is a safe procedure. However, problems may occur, including:  Infection.  Heavy vaginal bleeding.  Allergic reactions to medicines.  Damage to the cervix or other structures or organs.  Development of scar tissue (adhesions) inside the uterus, which can cause abnormal amounts of menstrual bleeding. This may make it harder to get pregnant in the future.  A hole (perforation) or puncture in the uterine wall. This is rare.  What happens before the procedure? Staying hydrated Follow instructions from your health care provider about hydration, which may include:  Up to 2 hours before the procedure - you may continue to drink clear liquids, such as water, clear fruit juice, black coffee, and plain tea.  Eating and drinking restrictions Follow  instructions from your health care provider about eating and drinking, which may include:  8 hours before the procedure - stop eating heavy meals or foods such as meat, fried foods, or fatty foods.  6 hours before the procedure - stop eating light meals or foods, such as toast or cereal.  6 hours before the procedure - stop drinking milk or drinks that contain milk.  2 hours before the procedure - stop drinking clear liquids. If your health care provider told you to take your medicine(s) on the day of your procedure, take them with only a sip of water.  Medicines  Ask your health care provider about: ? Changing or stopping your regular medicines. This is especially important if you are taking diabetes medicines or blood thinners. ? Taking medicines such as aspirin and ibuprofen. These medicines can thin your blood. Do not take these medicines before your procedure if your health care provider instructs you not to.  You may be given antibiotic medicine to help prevent infection. General instructions  For 24 hours before your procedure, do not: ? Douche. ? Use tampons. ? Use medicines, creams, or suppositories in the vagina. ? Have sexual intercourse.  You may be given a pregnancy test on the day of the procedure.  Plan to have someone take you home from the hospital or clinic.  You may have a blood or urine sample taken.  If you will be going home right after the procedure, plan to have someone with you for 24 hours. What happens during the procedure?  To reduce your risk of infection: ? Your health care team will wash or sanitize their hands. ? Your skin will be washed with soap.  An IV tube will be inserted into one of your veins.  You will be given one of the following: ? A medicine that numbs the area in and around the cervix (local anesthetic). ? A medicine to make you fall asleep (general anesthetic).  You will lie down on your back, with your feet in foot rests  (stirrups).  The  size and position of your uterus will be checked.  A lubricated instrument (speculum or Sims retractor) will be inserted into the back side of your vagina. The speculum will be used to hold apart the walls of your vagina so your health care provider can see your cervix.  A tool (tenaculum) will be attached to the lip of the cervix to stabilize it.  Your cervix will be softened and dilated. This may be done by: ? Taking a medicine. ? Having tapered dilators or thin rods (laminaria) or gradual widening instruments (tapered dilators) inserted into your cervix.  A small, sharp, curved instrument (curette) will be used to scrape a small amount of tissue or cells from the endometrium or cervical canal. In some cases, gentle suction is applied with the curette. The curette will then be removed. The cells will be taken to a lab for testing. The procedure may vary among health care providers and hospitals. What happens after the procedure?  You may have mild cramping, backache, pain, and light bleeding or spotting. You may pass small blood clots from your vagina.  You may have to wear compression stockings. These stockings help to prevent blood clots and reduce swelling in your legs.  Your blood pressure, heart rate, breathing rate, and blood oxygen level will be monitored until the medicines you were given have worn off. Summary  Dilation and curettage (D&C) involves stretching (dilation) the cervix and scraping (curettage) the inside lining of the uterus (endometrium).  After the procedure, you may have mild cramping, backache, pain, and light bleeding or spotting. You may pass small blood clots from your vagina.  Plan to have someone take you home from the hospital or clinic. This information is not intended to replace advice given to you by your health care provider. Make sure you discuss any questions you have with your health care provider. Document Released: 05/02/2005  Document Revised: 01/17/2016 Document Reviewed: 01/17/2016 Elsevier Interactive Patient Education  2018 [employer].  Dilation and Curettage or Vacuum Curettage, Care After These instructions give you information about caring for yourself after your procedure. Your doctor may also give you more specific instructions. Call your doctor if you have any problems or questions after your procedure. Follow these instructions at home: Activity  Do not drive or use heavy machinery while taking prescription pain medicine.  For 24 hours after your procedure, avoid driving.  Take short walks often, followed by rest periods. Ask your doctor what activities are safe for you. After one or two days, you may be able to return to your normal activities.  Do not lift anything that is heavier than 10 lb (4.5 kg) until your doctor approves.  For at least 2 weeks, or as long as told by your doctor: ? Do not douche. ? Do not use tampons. ? Do not have sex. General instructions  Take over-the-counter and prescription medicines only as told by your doctor. This is very important if you take blood thinning medicine.  Do not take baths, swim, or use a hot tub until your doctor approves. Take showers instead of baths.  Wear compression stockings as told by your doctor.  It is up to you to get the results of your procedure. Ask your doctor when your results will be ready.  Keep all follow-up visits as told by your doctor. This is important. Contact a doctor if:  You have very bad cramps that get worse or do not get better with medicine.  You have very  bad pain in your belly (abdomen).  You cannot drink fluids without throwing up (vomiting).  You get pain in a different part of the area between your belly and thighs (pelvis).  You have bad-smelling discharge from your vagina.  You have a rash. Get help right away if:  You are bleeding a lot from your vagina. A lot of bleeding means soaking more  than one sanitary pad in an hour, for 2 hours in a row.  You have clumps of blood (blood clots) coming from your vagina.  You have a fever or chills.  Your belly feels very tender or hard.  You have chest pain.  You have trouble breathing.  You cough up blood.  You feel dizzy.  You feel light-headed.  You pass out (faint).  You have pain in your neck or shoulder area. Summary  Take short walks often, followed by rest periods. Ask your doctor what activities are safe for you. After one or two days, you may be able to return to your normal activities.  Do not lift anything that is heavier than 10 lb (4.5 kg) until your doctor approves.  Do not take baths, swim, or use a hot tub until your doctor approves. Take showers instead of baths.  Contact your doctor if you have any symptoms of infection, like bad-smelling discharge from your vagina. This information is not intended to replace advice given to you by your health care provider. Make sure you discuss any questions you have with your health care provider. Document Released: 02/09/2008 Document Revised: 01/18/2016 Document Reviewed: 01/18/2016 Elsevier Interactive Patient Education  2017 [place] Anesthesia, Adult General anesthesia is the use of medicines to make a person "go to sleep" (be unconscious) for a medical procedure. General anesthesia is often recommended when a procedure:  Is long.  Requires you to be still or in an unusual position.  Is major and can cause you to lose blood.  Is impossible to do without general anesthesia.  The medicines used for general anesthesia are called general anesthetics. In addition to making you sleep, the medicines:  Prevent pain.  Control your blood pressure.  Relax your muscles.  Tell a health care provider about:  Any allergies you have.  All medicines you are taking, including vitamins, herbs, eye drops, creams, and over-the-counter medicines.  Any  problems you or family members have had with anesthetic medicines.  Types of anesthetics you have had in the past.  Any bleeding disorders you have.  Any surgeries you have had.  Any medical conditions you have.  Any history of heart or lung conditions, such as heart failure, sleep apnea, or chronic obstructive pulmonary disease (COPD).  Whether you are pregnant or may be pregnant.  Whether you use tobacco, alcohol, marijuana, or street drugs.  Any history of Armed forces logistics/support/administrative officer.  Any history of depression or anxiety. What are the risks? Generally, this is a safe procedure. However, problems may occur, including:  Allergic reaction to anesthetics.  Lung and heart problems.  Inhaling food or liquids from your stomach into your lungs (aspiration).  Injury to nerves.  Waking up during your procedure and being unable to move (rare).  Extreme agitation or a state of mental confusion (delirium) when you wake up from the anesthetic.  Air in the bloodstream, which can lead to stroke.  These problems are more likely to develop if you are having a major surgery or if you have an advanced medical condition. You can prevent  some of these complications by answering all of your health care provider's questions thoroughly and by following all pre-procedure instructions. General anesthesia can cause side effects, including:  Nausea or vomiting  A sore throat from the breathing tube.  Feeling cold or shivery.  Feeling tired, washed out, or achy.  Sleepiness or drowsiness.  Confusion or agitation.  What happens before the procedure? Staying hydrated Follow instructions from your health care provider about hydration, which may include:  Up to 2 hours before the procedure - you may continue to drink clear liquids, such as water, clear fruit juice, black coffee, and plain tea.  Eating and drinking restrictions Follow instructions from your health care provider about eating and  drinking, which may include:  8 hours before the procedure - stop eating heavy meals or foods such as meat, fried foods, or fatty foods.  6 hours before the procedure - stop eating light meals or foods, such as toast or cereal.  6 hours before the procedure - stop drinking milk or drinks that contain milk.  2 hours before the procedure - stop drinking clear liquids.  Medicines  Ask your health care provider about: ? Changing or stopping your regular medicines. This is especially important if you are taking diabetes medicines or blood thinners. ? Taking medicines such as aspirin and ibuprofen. These medicines can thin your blood. Do not take these medicines before your procedure if your health care provider instructs you not to. ? Taking new dietary supplements or medicines. Do not take these during the week before your procedure unless your health care provider approves them.  If you are told to take a medicine or to continue taking a medicine on the day of the procedure, take the medicine with sips of water. General instructions   Ask if you will be going home the same day, the following day, or after a longer hospital stay. ? Plan to have someone take you home. ? Plan to have someone stay with you for the first 24 hours after you leave the hospital or clinic.  For 3-6 weeks before the procedure, try not to use any tobacco products, such as cigarettes, chewing tobacco, and e-cigarettes.  You may brush your teeth on the morning of the procedure, but make sure to spit out the toothpaste. What happens during the procedure?  You will be given anesthetics through a mask and through an IV tube in one of your veins.  You may receive medicine to help you relax (sedative).  As soon as you are asleep, a breathing tube may be used to help you breathe.  An anesthesia specialist will stay with you throughout the procedure. He or she will help keep you comfortable and safe by continuing to  give you medicines and adjusting the amount of medicine that you get. He or she will also watch your blood pressure, pulse, and oxygen levels to make sure that the anesthetics do not cause any problems.  If a breathing tube was used to help you breathe, it will be removed before you wake up. The procedure may vary among health care providers and hospitals. What happens after the procedure?  You will wake up, often slowly, after the procedure is complete, usually in a recovery area.  Your blood pressure, heart rate, breathing rate, and blood oxygen level will be monitored until the medicines you were given have worn off.  You may be given medicine to help you calm down if you feel anxious or agitated.  If you will be going home the same day, your health care provider may check to make sure you can stand, drink, and urinate.  Your health care providers will treat your pain and side effects before you go home.  Do not drive for 24 hours if you received a sedative.  You may: ? Feel nauseous and vomit. ? Have a sore throat. ? Have mental slowness. ? Feel cold or shivery. ? Feel sleepy. ? Feel tired. ? Feel sore or achy, even in parts of your body where you did not have surgery. This information is not intended to replace advice given to you by your health care provider. Make sure you discuss any questions you have with your health care provider. Document Released: 08/09/2007 Document Revised: 10/13/2015 Document Reviewed: 04/16/2015 Elsevier Interactive Patient Education  2018 [employer] [facility], Adult, Care After These instructions provide you with information about caring for yourself after your procedure. Your health care provider may also give you more specific instructions. Your treatment has been planned according to current medical practices, but problems sometimes occur. Call your health care provider if you have any problems or questions after your procedure. What  can I expect after the procedure? After the procedure, it is common to have:  Vomiting.  A sore throat.  Mental slowness.  It is common to feel:  Nauseous.  Cold or shivery.  Sleepy.  Tired.  Sore or achy, even in parts of your body where you did not have surgery.  Follow these instructions at home: For at least 24 hours after the procedure:  Do not: ? Participate in activities where you could fall or become injured. ? Drive. ? Use heavy machinery. ? Drink alcohol. ? Take sleeping pills or medicines that cause drowsiness. ? Make important decisions or sign legal documents. ? Take care of children on your own.  Rest. Eating and drinking  If you vomit, drink water, juice, or soup when you can drink without vomiting.  Drink enough fluid to keep your urine clear or pale yellow.  Make sure you have little or no nausea before eating solid foods.  Follow the diet recommended by your health care provider. General instructions  Have a responsible adult stay with you until you are awake and alert.  Return to your normal activities as told by your health care provider. Ask your health care provider what activities are safe for you.  Take over-the-counter and prescription medicines only as told by your health care provider.  If you smoke, do not smoke without supervision.  Keep all follow-up visits as told by your health care provider. This is important. Contact a health care provider if:  You continue to have nausea or vomiting at home, and medicines are not helpful.  You cannot drink fluids or start eating again.  You cannot urinate after 8-12 hours.  You develop a skin rash.  You have fever.  You have increasing redness at the site of your procedure. Get help right away if:  You have difficulty breathing.  You have chest pain.  You have unexpected bleeding.  You feel that you are having a life-threatening or urgent problem. This information is not  intended to replace advice given to you by your health care provider. Make sure you discuss any questions you have with your health care provider. Document Released: 08/08/2000 Document Revised: 10/05/2015 Document Reviewed: 04/16/2015 Elsevier Interactive Patient Education  [employer].

## 2016-12-07 ENCOUNTER — Encounter (HOSPITAL_COMMUNITY)
Admission: RE | Admit: 2016-12-07 | Discharge: 2016-12-07 | Disposition: A | Discharge status: Home / Self Care | Payer: BC Managed Care – PPO | Source: Ambulatory Visit | Attending: Obstetrics & Gynecology | Admitting: Obstetrics & Gynecology

## 2016-12-09 ENCOUNTER — Ambulatory Visit (HOSPITAL_COMMUNITY)
Admission: RE | Admit: 2016-12-09 | Payer: BC Managed Care – PPO | Source: Ambulatory Visit | Admitting: Obstetrics & Gynecology

## 2016-12-09 ENCOUNTER — Encounter (HOSPITAL_COMMUNITY): Admission: RE | Payer: Self-pay | Source: Ambulatory Visit

## 2016-12-09 SURGERY — DILATATION & CURETTAGE/HYSTEROSCOPY WITH NOVASURE ABLATION
Anesthesia: General

## 2016-12-15 ENCOUNTER — Encounter: Payer: BC Managed Care – PPO | Admitting: Obstetrics & Gynecology

## 2016-12-20 ENCOUNTER — Ambulatory Visit (INDEPENDENT_AMBULATORY_CARE_PROVIDER_SITE_OTHER): Payer: BC Managed Care – PPO | Admitting: Neurology

## 2016-12-20 ENCOUNTER — Encounter: Payer: Self-pay | Admitting: Neurology

## 2016-12-20 VITALS — BP 143/97 | HR 82 | Ht 67.0 in | Wt 310.0 lb

## 2016-12-20 DIAGNOSIS — G4710 Hypersomnia, unspecified: Secondary | ICD-10-CM

## 2016-12-20 DIAGNOSIS — E669 Obesity, unspecified: Secondary | ICD-10-CM | POA: Diagnosis not present

## 2016-12-20 DIAGNOSIS — R0683 Snoring: Secondary | ICD-10-CM

## 2016-12-20 DIAGNOSIS — G4730 Sleep apnea, unspecified: Secondary | ICD-10-CM

## 2016-12-20 NOTE — Progress Notes (Signed)
SLEEP MEDICINE CLINIC   Provider:  Larey Seat, M D  Primary Care Physician:  Sharion Balloon, FNP   Referring Provider: Sharion Balloon, FNP    Chief Complaint  Patient presents with  . New Patient (Initial Visit)    pt alone, family states she snores and stops breathing.and jerks in sleep.     HPI:  Heather Hardin is a 41 y.o. female , seen here as in a referra/ revisit  from Dr. Lenna Gilford for sleep apnea evaluation.    Chief complaint according to patient :" My husband is sure I have apnea, based on my snoring and breathing pattern"  Heather Hardin reports that her husband is not just the witness of snoring, causing in her breathing but that she also takes apparently when she comes out of an apnea. This has been going on for a while her youngest child also has been witnessing her snoring and she states that she has been suffering from morning headaches, daytime sleepiness, super obesity, a dry mouth and hypertension. 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   Sleep habits are as follows:  She usually watches TV with her husband before going to bed around 9:30 PM. She reports that she is usually asleep promptly, she sleeps on her left side preferably, on one pillow the head of bed is not elevated. She states that she feels that she dreams sometimes. She has no history of sleepwalking, night terrors or enuresis. She usually has one restroom break at 4 AM not more and some nights none. She usually wakes spontaneously before her alarm wakes her at 5:30 AM. She uses her smart phone as an alarm clock but is also aroused when messages or emails come in. She wakes up in between, not sure why- wakes frequently with headaches, dull and throbbing.  Heather Hardin estimates that she gets between 7 and 8 hours of nocturnal sleep, yet fails to feel refreshed. .   Sleep medical history and family sleep history:  The patient's mother suffers from COPD and is oxygen  dependent, she's not sure that her mother may have apnea 2. There are no other family members that she is aware of with a diagnosis of sleep apnea. The patient developed obesity after her third pregnancy.   Social history: daughter 19,Her oldest son is 62, youngest 68. Married, the patient works for the [employer] ,Scientist, physiological. Caffeine - only one mug in AM, sweet tea one glass, soda one a day.  The patient quit smoking with her first pregnancy, 21 years ago and has not used tobacco products since, she seldomly drinks alcohol. She does have a remote  shift work history. She got her college degree 12 years ago.    Review of Systems: Out of a complete 14 system review, the patient complains of only the following symptoms, and all other reviewed systems are negative. , Sleepiness, snoring, headaches and hypertension, 3 C-sections her last pregnancy 2004 was followed by significant weight gain.  I reviewed the medication the patient is on Celexa, L-thyroxine, lisinopril and hydrochlorothiazide she also takes vitamin D supplements. Perimenopausal. Ablation for dysmenorhagia.   Epworth score 13 , Fatigue severity score 50  , depression score 1/15    Social History   Social History  . Marital status: Married    Spouse name: N/A  . Number of children: N/A  . Years of education: N/A   Occupational History  . Not on file.  Social History Main Topics  . Smoking status: Former Smoker    Packs/day: 1.00    Types: Cigarettes    Start date: 05/17/1991    Quit date: 05/17/1995  . Smokeless tobacco: Never Used  . Alcohol use 0.0 oz/week     Comment: rare  . Drug use: No  . Sexual activity: Yes    Birth control/ protection: Surgical     Comment: tubal   Other Topics Concern  . Not on file   Social History Narrative  . No narrative on file    Family History  Problem Relation Age of Onset  . Cancer Father        throat  . Hypertension Mother   . COPD Mother   . HIV Mother    . Heart failure Maternal Grandfather   . Heart failure Maternal Grandmother   . Stroke Maternal Grandmother   . Hypertension Maternal Grandmother   . Osteoarthritis Maternal Grandmother   . Diabetes Sister     Past Medical History:  Diagnosis Date  . Hypertension   . Thyroid disease     Past Surgical History:  Procedure Laterality Date  . [place], 2004  . TUBAL LIGATION  2004    Current Outpatient Prescriptions  Medication Sig Dispense Refill  . albuterol (PROVENTIL HFA;VENTOLIN HFA) 108 (90 Base) MCG/ACT inhaler Inhale 2 puffs into the lungs every 6 (six) hours as needed for wheezing or shortness of breath. 1 Inhaler 0  . aspirin-acetaminophen-caffeine (EXCEDRIN MIGRAINE) 250-250-65 MG tablet Take 2 tablets by mouth daily as needed for headache.    . escitalopram (LEXAPRO) 20 MG tablet TAKE ONE TABLET BY MOUTH ONCE DAILY 30 tablet 1  . ibuprofen (ADVIL,MOTRIN) 200 MG tablet Take 400-600 mg by mouth daily as needed for headache or moderate pain.    Marland Kitchen levothyroxine (SYNTHROID, LEVOTHROID) 50 MCG tablet TAKE ONE TABLET BY MOUTH ONCE DAILY (Patient taking differently: TAKE ONE TABLET BY MOUTH ONCE DAILY BEFORE BREAKFAST) 90 tablet 1  . lisinopril-hydrochlorothiazide (ZESTORETIC) 20-12.5 MG tablet Take 1 tablet by mouth daily. 90 tablet 3  . Melatonin 3 MG SUBL Place 3 mg under the tongue at bedtime as needed (sleep).    . Multiple Vitamins-Minerals (MULTIVITAMIN WITH MINERALS) tablet Take 1 tablet by mouth daily.    . Omega-3 Fatty Acids (FISH OIL BURP-LESS PO) Take 1 capsule by mouth daily.     Marland Kitchen Spacer/Aero Chamber Mouthpiece MISC 1 each by Does not apply route every 6 (six) hours as needed. 1 each 0  . Vitamin D, Ergocalciferol, (DRISDOL) 50000 units CAPS capsule TAKE 1 CAPSULE BY MOUTH ONCE A WEEK (Patient taking differently: TAKE 1 CAPSULE BY MOUTH ONCE A WEEK ON MONDAYS) 12 capsule 0   No current facility-administered medications for this visit.      Allergies as of 12/20/2016  . (No Known Allergies)    Vitals: BP (!) 143/97   Pulse 82   Ht 5\' 7"  (1.702 m)   Wt (!) 310 lb (140.6 kg)   BMI 48.55 kg/m  Last Weight:  Wt Readings from Last 1 Encounters:  12/20/16 (!) 310 lb (140.6 kg)   HQI:ONGE mass index is 48.55 kg/m.     Last Height:   Ht Readings from Last 1 Encounters:  12/20/16 5\' 7"  (1.702 m)    Physical exam:  General: The patient is awake, alert and appears not in acute distress. The patient is well groomed. Head: Normocephalic, atraumatic. Neck is supple. Mallampati ,  neck circumference: 19. Nasal airflow patent - but runny nose today.Retrognathia is seen.  Cardiovascular:  Regular rate and rhythm, without  murmurs or carotid bruit, and without distended neck veins. Respiratory: Lungs are clear to auscultation. Skin:  Without evidence of edema, or rash Trunk: BMI is 49. The patient's posture is stooped.   Neurologic exam : The patient is awake and alert, oriented to place and time.   Memory subjective  described as intact.  Attention span & concentration ability appears normal.  Speech is fluent,  without dysarthria, dysphonia or aphasia.  Mood and affect are appropriate.  Cranial nerves: Pupils are equal and briskly reactive to light. Funduscopic exam without evidence of pallor or edema.  Extraocular movements  in vertical and horizontal planes intact and without nystagmus. Visual fields by finger perimetry are intact. Hearing to finger rub intact.  Facial sensation intact to fine touch. Facial motor strength is symmetric and tongue and uvula move midline. Shoulder shrug was symmetrical.   Motor exam:  Normal tone, muscle bulk and symmetric strength in all extremities. Sensory:  Fine touch, pinprick and vibration were tested in all extremities. Proprioception tested in the upper extremities was normal. Coordination: Rapid alternating movements in the fingers/hands was normal. Finger-to-nose maneuver   normal without evidence of ataxia, dysmetria or tremor. Gait and station: Patient walks without assistive device. Strength within normal limits. Stance is stable and normal.  Deep tendon reflexes: in the  upper and lower extremities are symmetric and intact. Babinski maneuver response is downgoing.   Assessment:  After physical and neurologic examination, review of laboratory studies,  Personal review of imaging studies, reports of other /same  Imaging studies, results of polysomnography and / or neurophysiology testing and pre-existing records as far as provided in visit., my assessment is   1)  OSA :Heather Hardin has by now reached a category of super obesity with a body mass index that exceeds 40, she wakes up with headaches, she's excessively daytime sleepy, and she has been witnessed to snore and have apnea. Since the patient also awakens with headaches in the morning there is a high index of suspicion for the presence of obesity hypoventilation, a condition that manifests itself with hypoxemia and hypercapnia. Capnography will be part of her sleep study. I will order an attended sleep study with a split night polysomnography protocol. This means that the patient will be 2 hours observed sleeping in the sleep lab and if her apnea hypopnea index exceeds 20/h she will be placed on CPAP. Should the patient has mild apnea, apnea not exacerbated by REM sleep or apnea not associated with hypoxemia she would have the treatment option of a dental device as well.  2) super obesity- patient may benefit from [facility] .  The patient was advised of the nature of the diagnosed disorder , the treatment options and the  risks for general health and wellness arising from not treating the condition.   I spent more than 40 minutes of face to face time with the patient.  Greater than 50% of time was spent in counseling and coordination of care. We have discussed the diagnosis and differential and I answered the patient's  questions.    Plan:  Treatment plan and additional workup :  Refer to [facility] , Dr. Leafy Ro.  Refer to split night PSG, with capnography.   Rv with me.    Larey Seat, MD 0/[id number]/9381, 8:29 PM  Certified in Neurology by ABPN Certified in Sleep Medicine by [employer]  [facility] [address] [place], [place] [address]

## 2016-12-20 NOTE — Patient Instructions (Signed)
Obesity Hypoventilation Syndrome Obesity hypoventilation syndrome (OHS) means that you are not breathing well enough to get air in and out of your lungs efficiently (ventilation). This causes a low oxygen level and a high carbon dioxide level in your blood (hypoventilation). Having too much total body fat (obesity) is a significant risk factor for developing OHS. OHS makes it harder for your heart to pump oxygen-rich blood to your body. It can cause sleep disturbances and make you feel sleepy during the day. Over time, OHS can increase your risk for:  Heart disease.  High blood pressure (hypertension).  Reduced ability to absorb sugar from the bloodstream (insulin resistance).  Heart failure. Over time, OHS weakens your heart and can lead to heart failure.  What are the causes? The exact cause of OHS is not known. Possible causes include:  Pressure on the lungs from excess body weight.  Obesity-related changes in how much air the lungs can hold (lung capacity) and how much they can expand (lung compliance).  Failure of the brain to regulate oxygen and carbon dioxide levels properly.  Chemicals (hormones) produced by excess fat cells interfering with breathing regulation.  A breathing condition in which breathing pauses or becomes shallow during sleep (sleep apnea). This condition can eventually cause the body to ventilate poorly and to hold onto carbon dioxide during the day.  What increases the risk? You may have a greater risk for OHS if you:  Have a BMI of 30 or higher. BMI is an estimate of body fat that is calculated from height and weight. For adults, a BMI of 30 or higher is considered obese.  Are 40?42 years old.  Carry most of your excess weight around your waist.  Experience moderate symptoms of sleep apnea.  What are the signs or symptoms? The most common symptoms of OHS are:  Daytime sleepiness.  Lack of energy.  Shortness of breath.  Morning  headaches.  Sleep apnea.  Trouble concentrating.  Irritability, mood swings, or depression.  Swollen veins in the neck.  Swelling of the legs.  How is this diagnosed? Your health care provider may suspect OHS if you are obese and have poor breathing during the day and at night. Your health care provider will also do a physical exam. You may have tests to:  Measure your BMI.  Measure your blood oxygen level with a sensor placed on your finger (pulse oximetry).  Measure blood oxygen and carbon dioxide in a blood sample.  Measure the amount of red blood cells in a blood sample. OHS causes the number of red blood cells you have to increase (polycythemia).  Check your breathing ability (pulmonary function testing).  Check your breathing ability, breathing patterns, and oxygen level while you sleep (sleep study).  You may also have a chest X-ray to rule out other breathing problems. You may have an electrocardiogram (ECG) and or echocardiogram to check for signs of heart failure. How is this treated? Weight loss is the most important part of treatment for OHS, and it may be the only treatment that you need. Other treatments may include:  Using a device to open your airway while you sleep, such as a continuous positive airway pressure (CPAP) machine that delivers oxygen to your airway through a mask.  Surgery (gastric bypass surgery) to lower your BMI. This may be needed if: ? You are very obese. ? Other treatments have not worked for you. ? Your OHS is very severe and is causing organ damage, such as   heart failure.  Follow these instructions at home: Medicines  Take over-the-counter and prescription medicines only as told by your health care provider.  Ask your health care provider what medicines are safe for you. You may be told to avoid medicines that can impair breathing and make OHS worse, such as sedatives and narcotics. Sleeping habits  If you are prescribed a CPAP  machine, make sure you understand and use the machine as directed.  Try to get 8 hours of sleep every night.  Go to bed at the same time every night, and get up at the same time every day. General instructions  Work with your health care provider to make a diet and exercise plan that helps you reach and maintain a healthy weight.  Eat a healthy diet.  Avoid smoking.  Exercise regularly as told by your health care provider.  During the evening, do not drink caffeine and do not eat heavy meals.  Keep all follow-up visits as told by your health care provider. This is important. Contact a health care provider if:   You experience new or worsening shortness of breath.  You have chest pain.  You have an irregular heartbeat (palpitations).  You have dizziness.  You faint.  You develop a cough.  You have a fever.  You have chest pain when you breathe (pleurisy). This information is not intended to replace advice given to you by your health care provider. Make sure you discuss any questions you have with your health care provider. Document Released: 10/12/2015 Document Revised: 11/20/2015 Document Reviewed: 10/12/2015 Elsevier Interactive Patient Education  2018 [employer]  

## 2017-01-02 ENCOUNTER — Ambulatory Visit (INDEPENDENT_AMBULATORY_CARE_PROVIDER_SITE_OTHER): Payer: BC Managed Care – PPO | Admitting: Neurology

## 2017-01-02 DIAGNOSIS — G4733 Obstructive sleep apnea (adult) (pediatric): Secondary | ICD-10-CM | POA: Diagnosis not present

## 2017-01-02 DIAGNOSIS — R0683 Snoring: Secondary | ICD-10-CM

## 2017-01-02 DIAGNOSIS — E669 Obesity, unspecified: Secondary | ICD-10-CM

## 2017-01-02 DIAGNOSIS — G4710 Hypersomnia, unspecified: Secondary | ICD-10-CM

## 2017-01-02 DIAGNOSIS — G4730 Sleep apnea, unspecified: Secondary | ICD-10-CM

## 2017-01-04 ENCOUNTER — Other Ambulatory Visit: Payer: Self-pay | Admitting: *Deleted

## 2017-01-04 DIAGNOSIS — F32A Depression, unspecified: Secondary | ICD-10-CM

## 2017-01-04 DIAGNOSIS — F411 Generalized anxiety disorder: Secondary | ICD-10-CM

## 2017-01-04 DIAGNOSIS — F329 Major depressive disorder, single episode, unspecified: Secondary | ICD-10-CM

## 2017-01-04 MED ORDER — ESCITALOPRAM OXALATE 20 MG PO TABS: 20.0000 mg | ORAL_TABLET | Freq: Every day | ORAL | 0 refills | Status: DC

## 2017-01-07 NOTE — Procedures (Signed)
PATIENT'S NAME:  Heather Hardin, Heather Hardin DOB:      [birth year]      MR#:    [id number]     DATE OF RECORDING: 01/02/2017 REFERRING M.D.:  Sharion Balloon FNP Study Performed:  Split-Night Titration Study HISTORY:  Heather Hardin reports that her husband is not just the witness of snoring, but apnea- her pausing in her breathing. Her youngest child also has been witnessing her snoring and she states that she has been suffering from morning headaches, daytime sleepiness, super obesity, a dry mouth and hypertension. 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  The patient endorsed the Epworth Sleepiness Scale at 13/24 points, FSS at 50 of 63 points.   The patient's weight 310 pounds with a height of 67 (inches), resulting in a BMI of 48.8 kg/m2. The patient's neck circumference measured 19 inches.  CURRENT MEDICATIONS: Albuterol, Aspirin, Escitalopram, Ibuprofen, Levothyroxine, Lisinopril, Multi-Vitamin, Omega-3, Vitamin D   PROCEDURE:  This is a multichannel digital polysomnogram utilizing the SomnoStar 11.2 system.  Electrodes and sensors were applied and monitored per AASM Specifications.   EEG, EOG, Chin and Limb EMG, were sampled at 200 Hz.  ECG, Snore and Nasal Pressure, Thermal Airflow, Respiratory Effort, CPAP Flow and Pressure, Oximetry was sampled at 50 Hz. Digital video and audio were recorded.      BASELINE STUDY WITHOUT CPAP RESULTS:  Lights Out was at 21:01 and Lights On at 05:35.  Total recording time (TRT) was 184.5, with a total sleep time (TST) of 120 minutes.  The patient's sleep latency was 50 minutes. The sleep efficiency was 65 %.   SLEEP ARCHITECTURE: WASO (Wake after sleep onset) was 2.5 minutes, Stage N1 was 4 minutes, Stage N2 was 116 minutes, Stage N3 was 0 minutes and Stage R (REM sleep) was 0 minutes.  The percentages were Stage N1 3.3%, Stage N2 96.7%, Stage N3 0% and Stage R (REM sleep) 0%. All sleep non REM.   RESPIRATORY  ANALYSIS:  There were a total of 265 respiratory events:  124 obstructive apneas, 0 central apneas and 141 hypopneas with 0 respiratory event related arousals (RERAs). Loud Snoring was noted.    The total APNEA/HYPOPNEA INDEX (AHI) was 132.5 /hour and the total RESPIRATORY DISTURBANCE INDEX was 132.5 /hour.  The REM AHI was 0, /hour versus a non-REM AHI of 132.5 /hour. The patient spent 81 minutes sleep time in the supine position 310 minutes in non-supine. The supine AHI was 0.0 /hour versus a non-supine AHI of 132.5 /hour.  OXYGEN SATURATION & C02:  The wake baseline 02 saturation was 98%, with the lowest being 84%. Time spent below 89% saturation equaled 11 minutes.  PERIODIC LIMB MOVEMENTS:    The patient had a total of 0 Periodic Limb Movements.  The arousals were noted as: 1 were spontaneous, 0 were associated with PLMs, and 258 were associated with respiratory events.   Audio and video analysis did not show any abnormal or unusual movements, behaviors, phonations or vocalizations. The patient took one bathroom break. EKG was in keeping with normal sinus rhythm (NSR)   TITRATION STUDY WITH CPAP RESULTS:   CPAP was initiated at 5 cmH20 with heated humidity per AASM split night standards and pressure was advanced to 13/13 cmH20 because of hypopneas, apneas and desaturations.  At a PAP pressure of 13 cmH20, there was a reduction of the AHI to 0.0 /hour.  Total recording time (TRT) was 330 minutes, with a total  sleep time (TST) of 271 minutes. The patient's sleep latency was 54.5 minutes. REM latency was 46 minutes.  The sleep efficiency was 82.1 %.    SLEEP ARCHITECTURE: Wake after sleep was 13.5 minutes, Stage N1 10 minutes, Stage N2 125 minutes, Stage N3 0 minutes and Stage R (REM sleep) 136 minutes. The percentages were: Stage N1 3.7%, Stage N2 46.1%, Stage N3 0% and Stage R (REM sleep) 50.2%.   RESPIRATORY ANALYSIS:  There were a total of 26 respiratory events: 0 obstructive apneas, 0  central apneas and 0 mixed apneas with a total of 0 apneas and an apnea index (AI) of 0. There were 26 hypopneas with a hypopnea index of 5.8 /hour. The patient also had 0 respiratory event related arousals (RERAs).     The total APNEA/HYPOPNEA INDEX (AHI) was 5.8 /hour and the total RESPIRATORY DISTURBANCE INDEX was 5.8 /hour.  8 events occurred in REM sleep and 18 events in NREM. The REM AHI was 3.5 /hour versus a non-REM AHI of 8 /hour. The patient spent 30% of total sleep time in the supine position. The supine AHI was 7.4 /hour, versus a non-supine AHI of 5.1/hour.  OXYGEN SATURATION & C02:  The wake baseline 02 saturation was 97%, with the lowest being 88%. Time spent below 89% saturation equaled 1 minute.  PERIODIC LIMB MOVEMENTS:   The patient had a total of 0 Periodic Limb Movements. The arousals were noted as: 15 were spontaneous, 0 were associated with PLMs, and 25 were associated with respiratory events.  POLYSOMNOGRAPHY IMPRESSION :   1. Severe Obstructive Sleep Apnea (OSA), AHI over 100/hr. Associated with thunderous snoring.  2. OSA responded well to CPAP, the last pressure of 13 cm water was only tried for 22 minutes. For this reason, I prefer to order an auto-titration capable device 8-16 cm water.    RECOMMENDATIONS:  CPAP -order for auto-titration capable device 8-16 cm water. Mask was a RESMED AirFit P10 medium. Heated humidity is needed.  1. Compliance to PAP therapy should be emphasized.  Compliance, AHI and air leak information to be downloaded for objective assessment at 30 days, 180 days and annually thereafter.   2. Further information regarding OSA may be obtained from [employer] (www.sleepfoundation.org) or American Sleep Apnea Association (www.sleepapnea.org). 3. A follow up appointment will be scheduled in the Sleep Clinic at [facility].      I certify that I have reviewed the entire raw data recording prior to the issuance of this  report in accordance with the Standards of Accreditation of the American Academy of Sleep Medicine (AASM)      Larey Seat, M.D.  01-07-2017  Diplomat, American Board of Psychiatry and Neurology  Diplomat, [place] [facility] Medical Director, [place] [facility]

## 2017-01-07 NOTE — Addendum Note (Signed)
Addended by: Larey Seat on: 01/07/2017 02:22 PM   Modules accepted: Orders

## 2017-01-10 ENCOUNTER — Telehealth: Payer: Self-pay | Admitting: Neurology

## 2017-01-10 NOTE — Telephone Encounter (Signed)
Called pt with sleep study results. No answer at this time. LVM for pt to call back

## 2017-01-10 NOTE — Telephone Encounter (Signed)
-----   Message from Larey Seat, MD sent at 01/07/2017  2:22 PM EDT ----- POLYSOMNOGRAPHY IMPRESSION :   1. Severe Obstructive Sleep Apnea (OSA), AHI over 100/hr.  Associated with thunderous snoring.  2. OSA responded well to CPAP, the last pressure of 13 cm water  was only tried for 22 minutes. For this reason, I prefer to order  an auto-titration capable device 8-16 cm water.    RECOMMENDATIONS: I have placed an order for CPAP with auto-titration capable device  8-16 cm water.This will help to adjust the pressure during weight loss.  Mask was a RESMED AirFit P10 medium- a nasal pillow type interface. Heated  humidity is ordered.

## 2017-01-11 NOTE — Telephone Encounter (Signed)
Pt returned call. I advised pt that Dr. Brett Fairy reviewed their sleep study results and found that pt has sever OSA. Dr. Brett Fairy recommends that pt starts CPAP. I reviewed PAP compliance expectations with the pt. Pt is agreeable to starting a CPAP. I advised pt that an order will be sent to a DME, [facility], and [facility] will call the pt within about one week after they file with the pt's insurance. [facility] will show the pt how to use the machine, fit for masks, and troubleshoot the CPAP if needed. A follow up appt was made for insurance purposes with Ward Givens NP at 7:30 am on Apr 12 2017. Pt verbalized understanding to arrive 15 minutes early and bring their CPAP. A letter with all of this information in it will be mailed to the pt as a reminder. I verified with the pt that the address we have on file is correct. Pt verbalized understanding of results. Pt had no questions at this time but was encouraged to call back if questions arise.

## 2017-02-02 ENCOUNTER — Other Ambulatory Visit: Payer: Self-pay | Admitting: Family

## 2017-02-02 DIAGNOSIS — F32A Depression, unspecified: Secondary | ICD-10-CM

## 2017-02-02 DIAGNOSIS — F411 Generalized anxiety disorder: Secondary | ICD-10-CM

## 2017-02-02 DIAGNOSIS — F329 Major depressive disorder, single episode, unspecified: Secondary | ICD-10-CM

## 2017-02-02 NOTE — Telephone Encounter (Signed)
Last seen 10/03/16  [facility]

## 2017-02-23 ENCOUNTER — Ambulatory Visit: Payer: BC Managed Care – PPO | Admitting: Family

## 2017-02-27 ENCOUNTER — Ambulatory Visit: Payer: BC Managed Care – PPO | Admitting: Family

## 2017-03-07 ENCOUNTER — Ambulatory Visit (INDEPENDENT_AMBULATORY_CARE_PROVIDER_SITE_OTHER): Payer: BC Managed Care – PPO | Admitting: Family

## 2017-03-07 ENCOUNTER — Encounter: Payer: Self-pay | Admitting: Family

## 2017-03-07 VITALS — BP 129/89 | HR 74 | Temp 97.5°F | Ht 67.0 in | Wt 309.0 lb

## 2017-03-07 DIAGNOSIS — E559 Vitamin D deficiency, unspecified: Secondary | ICD-10-CM

## 2017-03-07 DIAGNOSIS — F331 Major depressive disorder, recurrent, moderate: Secondary | ICD-10-CM

## 2017-03-07 DIAGNOSIS — F411 Generalized anxiety disorder: Secondary | ICD-10-CM | POA: Diagnosis not present

## 2017-03-07 DIAGNOSIS — E039 Hypothyroidism, unspecified: Secondary | ICD-10-CM

## 2017-03-07 DIAGNOSIS — E8881 Metabolic syndrome: Secondary | ICD-10-CM

## 2017-03-07 DIAGNOSIS — E781 Pure hyperglyceridemia: Secondary | ICD-10-CM | POA: Diagnosis not present

## 2017-03-07 DIAGNOSIS — I10 Essential (primary) hypertension: Secondary | ICD-10-CM

## 2017-03-07 DIAGNOSIS — G4700 Insomnia, unspecified: Secondary | ICD-10-CM | POA: Insufficient documentation

## 2017-03-07 DIAGNOSIS — G4733 Obstructive sleep apnea (adult) (pediatric): Secondary | ICD-10-CM | POA: Insufficient documentation

## 2017-03-07 NOTE — Progress Notes (Signed)
Subjective:    Patient ID: Heather Hardin, female    DOB: 09-Aug-1975, 41 y.o.   MRN: [id number]  Pt presents to the office today to discuss weight loss surgery. PT states she has tried dieting and exercise in the past with mininium weight loss. Pt states she has lost 20 lbs, but then will gain 30 lbs.   Pt has tired counting calories, weight watchers, phentermine, cutting soft drinks, and keto diet.  Pt states she has consult with bariatric surgery on 03/17/17.  Anxiety  Presents for follow-up visit. Symptoms include excessive worry, insomnia, irritability, nervous/anxious behavior and restlessness. Patient reports no depressed mood. Symptoms occur most days. The severity of symptoms is moderate. The quality of sleep is good. Nighttime awakenings: none.    Depression         This is a chronic problem.  The current episode started more than 1 year ago.   The onset quality is gradual.   The problem occurs intermittently.  The problem has been waxing and waning since onset.  Associated symptoms include insomnia, irritable, restlessness and sad.  Associated symptoms include no helplessness and no hopelessness.  Past treatments include SSRIs - Selective serotonin reuptake inhibitors.  Past medical history includes thyroid problem and anxiety.   Thyroid Problem  Presents for follow-up visit. Symptoms include anxiety and hoarse voice. Patient reports no constipation, depressed mood or diarrhea. The symptoms have been stable. Her past medical history is significant for hyperlipidemia.  Hyperlipidemia  This is a chronic problem. The current episode started more than 1 year ago. The problem is uncontrolled. Recent lipid tests were reviewed and are high. Exacerbating diseases include obesity. Current antihyperlipidemic treatment includes diet change. The current treatment provides mild improvement of lipids. Risk factors for coronary artery disease include dyslipidemia, obesity, hypertension, a sedentary  lifestyle and post-menopausal.  Insomnia  Primary symptoms: no difficulty falling asleep, frequent awakening.  The current episode started more than one year. The onset quality is gradual. The problem occurs intermittently. The problem has been waxing and waning since onset. PMH includes: depression.  Metabolic Syndrome  Pt is not active at this time. Pt's BMI is 48.40 OSA PT using CPAP every night. Stable.    Review of Systems  Constitutional: Positive for irritability.  HENT: Positive for hoarse voice.   Gastrointestinal: Negative for constipation and diarrhea.  Psychiatric/Behavioral: Positive for depression. The patient is nervous/anxious and has insomnia.   All other systems reviewed and are negative.      Objective:   Physical Exam  Constitutional: She is oriented to person, place, and time. She appears well-developed and well-nourished. She is irritable. No distress.  Morbid obese   HENT:  Head: Normocephalic and atraumatic.  Right Ear: External ear normal.  Left Ear: External ear normal.  Nose: Nose normal.  Mouth/Throat: Oropharynx is clear and moist.  Eyes: Pupils are equal, round, and reactive to light.  Neck: Normal range of motion. Neck supple. No thyromegaly present.  Cardiovascular: Normal rate, regular rhythm, normal heart sounds and intact distal pulses.   No murmur heard. Pulmonary/Chest: Effort normal and breath sounds normal. No respiratory distress. She has no wheezes.  Abdominal: Soft. Bowel sounds are normal. She exhibits no distension. There is no tenderness.  Musculoskeletal: Normal range of motion. She exhibits no edema or tenderness.  Neurological: She is alert and oriented to person, place, and time.  Skin: Skin is warm and dry.  Psychiatric: She has a normal mood and affect. Her behavior is normal.  Judgment and thought content normal.  Vitals reviewed.     BP 129/89   Pulse 74   Temp (!) 97.5 F (36.4 C) (Oral)   Ht 5' 7"  (1.702 m)   Wt  (!) 309 lb (140.2 kg)   BMI 48.40 kg/m      Assessment & Plan:  1. Hypothyroidism, unspecified type - CMP14+EGFR - TSH  2. Hypertriglyceridemia - CMP14+EGFR - Lipid panel  3. GAD (generalized anxiety disorder) - CMP14+EGFR  4. Essential hypertension - CMP14+EGFR  5. Moderate episode of recurrent major depressive disorder (HCC)  - ZCH88+FOYD  6. Metabolic syndrom - XAJ28+NOMV  7. Vitamin D deficiency  - CMP14+EGFR - VITAMIN D 25 Hydroxy (Vit-D Deficiency, Fractures)  8. Insomnia, unspecified type - CMP14+EGFR  9. OSA (obstructive sleep apnea) - CMP14+EGFR   Continue all meds Labs pending Health Maintenance reviewed Diet and exercise encouraged RTO 6 months   Evelina Dun, FNP

## 2017-03-07 NOTE — Patient Instructions (Signed)
Exercising to Lose Weight Exercising can help you to lose weight. In order to lose weight through exercise, you need to do vigorous-intensity exercise. You can tell that you are exercising with vigorous intensity if you are breathing very hard and fast and cannot hold a conversation while exercising. Moderate-intensity exercise helps to maintain your current weight. You can tell that you are exercising at a moderate level if you have a higher heart rate and faster breathing, but you are still able to hold a conversation. How often should I exercise? Choose an activity that you enjoy and set realistic goals. Your health care provider can help you to make an activity plan that works for you. Exercise regularly as directed by your health care provider. This may include:  Doing resistance training twice each week, such as: ? Push-ups. ? Sit-ups. ? Lifting weights. ? Using resistance bands.  Doing a given intensity of exercise for a given amount of time. Choose from these options: ? 150 minutes of moderate-intensity exercise every week. ? 75 minutes of vigorous-intensity exercise every week. ? A mix of moderate-intensity and vigorous-intensity exercise every week.  Children, pregnant women, people who are out of shape, people who are overweight, and older adults may need to consult a health care provider for individual recommendations. If you have any sort of medical condition, be sure to consult your health care provider before starting a new exercise program. What are some activities that can help me to lose weight?  Walking at a rate of at least 4.5 miles an hour.  Jogging or running at a rate of 5 miles per hour.  Biking at a rate of at least 10 miles per hour.  Lap swimming.  Roller-skating or in-line skating.  Cross-country skiing.  Vigorous competitive sports, such as football, basketball, and soccer.  Jumping rope.  Aerobic dancing. How can I be more active in my day-to-day  activities?  Use the stairs instead of the elevator.  Take a walk during your lunch break.  If you drive, park your car farther away from work or school.  If you take public transportation, get off one stop early and walk the rest of the way.  Make all of your phone calls while standing up and walking around.  Get up, stretch, and walk around every 30 minutes throughout the day. What guidelines should I follow while exercising?  Do not exercise so much that you hurt yourself, feel dizzy, or get very short of breath.  Consult your health care provider prior to starting a new exercise program.  Wear comfortable clothes and shoes with good support.  Drink plenty of water while you exercise to prevent dehydration or heat stroke. Body water is lost during exercise and must be replaced.  Work out until you breathe faster and your heart beats faster. This information is not intended to replace advice given to you by your health care provider. Make sure you discuss any questions you have with your health care provider. Document Released: 06/04/2010 Document Revised: 10/08/2015 Document Reviewed: 10/03/2013 Elsevier Interactive Patient Education  2018 Elsevier Inc.  

## 2017-03-08 ENCOUNTER — Other Ambulatory Visit: Payer: Self-pay | Admitting: Family

## 2017-03-08 ENCOUNTER — Other Ambulatory Visit: Payer: Self-pay

## 2017-03-08 ENCOUNTER — Telehealth: Payer: Self-pay | Admitting: Family

## 2017-03-08 LAB — CMP14+EGFR
ALT: 42 [IU]/L — ABNORMAL HIGH (ref 0–32)
AST: 31 [IU]/L (ref 0–40)
Albumin/Globulin Ratio: 1.4 (ref 1.2–2.2)
Albumin: 4.4 g/dL (ref 3.5–5.5)
Alkaline Phosphatase: 83 [IU]/L (ref 39–117)
BUN/Creatinine Ratio: 11 (ref 9–23)
BUN: 9 mg/dL (ref 6–24)
Bilirubin Total: 0.3 mg/dL (ref 0.0–1.2)
CO2: 25 mmol/L (ref 20–29)
Calcium: 9.8 mg/dL (ref 8.7–10.2)
Chloride: 101 mmol/L (ref 96–106)
Creatinine, Ser: 0.85 mg/dL (ref 0.57–1.00)
GFR calc Af Amer: 98 mL/min/{1.73_m2} (ref >59)
GFR calc non Af Amer: 85 mL/min/{1.73_m2} (ref >59)
Globulin, Total: 3.1 g/dL (ref 1.5–4.5)
Glucose: 85 mg/dL (ref 65–99)
Potassium: 4.1 mmol/L (ref 3.5–5.2)
Sodium: 141 mmol/L (ref 134–144)
Total Protein: 7.5 g/dL (ref 6.0–8.5)

## 2017-03-08 LAB — LIPID PANEL
Chol/HDL Ratio: 3.8 {ratio} (ref 0.0–4.4)
Cholesterol, Total: 159 mg/dL (ref 100–199)
HDL: 42 mg/dL (ref >39)
LDL Calculated: 82 mg/dL (ref 0–99)
Triglycerides: 175 mg/dL — ABNORMAL HIGH (ref 0–149)
VLDL Cholesterol Cal: 35 mg/dL (ref 5–40)

## 2017-03-08 LAB — TSH: TSH: 12.91 u[IU]/mL — ABNORMAL HIGH (ref 0.450–4.500)

## 2017-03-08 LAB — VITAMIN D 25 HYDROXY (VIT D DEFICIENCY, FRACTURES): Vit D, 25-Hydroxy: 24.8 ng/mL — ABNORMAL LOW (ref 30.0–100.0)

## 2017-03-08 MED ORDER — LEVOTHYROXINE SODIUM 75 MCG PO TABS: 75.0000 ug | ORAL_TABLET | Freq: Every day | ORAL | 1 refills | Status: DC

## 2017-03-08 MED ORDER — VITAMIN D (ERGOCALCIFEROL) 1.25 MG (50000 UNIT) PO CAPS: 50000.0000 [IU] | ORAL_CAPSULE | ORAL | 1 refills | Status: DC

## 2017-03-08 MED ORDER — VITAMIN D (ERGOCALCIFEROL) 1.25 MG (50000 UNIT) PO CAPS
50000.0000 [IU] | ORAL_CAPSULE | ORAL | 1 refills | Status: DC
Start: 2017-03-08 — End: 2017-03-08

## 2017-03-08 NOTE — Telephone Encounter (Signed)
Patients meds sent to [facility] per her request

## 2017-03-08 NOTE — Telephone Encounter (Signed)
Meds sent to [facility] per patient's request.

## 2017-03-24 ENCOUNTER — Other Ambulatory Visit (HOSPITAL_COMMUNITY): Payer: Self-pay | Admitting: General Surgery

## 2017-04-01 ENCOUNTER — Other Ambulatory Visit: Payer: Self-pay | Admitting: Family

## 2017-04-01 DIAGNOSIS — I10 Essential (primary) hypertension: Secondary | ICD-10-CM

## 2017-04-03 ENCOUNTER — Other Ambulatory Visit: Payer: Self-pay

## 2017-04-03 DIAGNOSIS — F411 Generalized anxiety disorder: Secondary | ICD-10-CM

## 2017-04-03 DIAGNOSIS — F32A Depression, unspecified: Secondary | ICD-10-CM

## 2017-04-03 DIAGNOSIS — F329 Major depressive disorder, single episode, unspecified: Secondary | ICD-10-CM

## 2017-04-07 ENCOUNTER — Ambulatory Visit (HOSPITAL_COMMUNITY): Payer: BC Managed Care – PPO

## 2017-04-10 ENCOUNTER — Ambulatory Visit (HOSPITAL_COMMUNITY)
Admission: RE | Admit: 2017-04-10 | Discharge: 2017-04-10 | Disposition: A | Discharge status: Home / Self Care | Payer: BC Managed Care – PPO | Source: Ambulatory Visit | Attending: General Surgery | Admitting: General Surgery

## 2017-04-10 ENCOUNTER — Other Ambulatory Visit: Payer: Self-pay

## 2017-04-10 DIAGNOSIS — Z01810 Encounter for preprocedural cardiovascular examination: Secondary | ICD-10-CM | POA: Diagnosis present

## 2017-04-10 DIAGNOSIS — R9431 Abnormal electrocardiogram [ECG] [EKG]: Secondary | ICD-10-CM | POA: Diagnosis not present

## 2017-04-12 ENCOUNTER — Ambulatory Visit: Payer: Self-pay | Admitting: Adult Health

## 2017-04-12 ENCOUNTER — Other Ambulatory Visit: Payer: Self-pay | Admitting: *Deleted

## 2017-04-12 DIAGNOSIS — F32A Depression, unspecified: Secondary | ICD-10-CM

## 2017-04-12 DIAGNOSIS — F411 Generalized anxiety disorder: Secondary | ICD-10-CM

## 2017-04-12 DIAGNOSIS — F329 Major depressive disorder, single episode, unspecified: Secondary | ICD-10-CM

## 2017-04-12 MED ORDER — ESCITALOPRAM OXALATE 20 MG PO TABS: 20.0000 mg | ORAL_TABLET | Freq: Every day | ORAL | 0 refills | Status: DC

## 2017-04-17 ENCOUNTER — Ambulatory Visit: Payer: BC Managed Care – PPO | Admitting: Skilled Nursing Facility1

## 2017-04-20 ENCOUNTER — Ambulatory Visit: Payer: BC Managed Care – PPO | Admitting: Skilled Nursing Facility1

## 2017-05-03 ENCOUNTER — Other Ambulatory Visit: Payer: Self-pay | Admitting: Family

## 2017-05-03 DIAGNOSIS — F32A Depression, unspecified: Secondary | ICD-10-CM

## 2017-05-03 DIAGNOSIS — F411 Generalized anxiety disorder: Secondary | ICD-10-CM

## 2017-05-03 DIAGNOSIS — F329 Major depressive disorder, single episode, unspecified: Secondary | ICD-10-CM

## 2017-05-03 MED ORDER — ESCITALOPRAM OXALATE 20 MG PO TABS: 20.0000 mg | ORAL_TABLET | Freq: Every day | ORAL | 0 refills | Status: DC

## 2017-05-03 NOTE — Telephone Encounter (Signed)
Requesting refill of Lexapro. Please advise.

## 2017-05-04 MED ORDER — ESCITALOPRAM OXALATE 20 MG PO TABS: 20.0000 mg | ORAL_TABLET | Freq: Every day | ORAL | 0 refills | Status: DC

## 2017-05-05 ENCOUNTER — Encounter: Payer: Self-pay | Admitting: Registered"

## 2017-05-05 ENCOUNTER — Encounter: Payer: BC Managed Care – PPO | Attending: General Surgery | Admitting: Registered"

## 2017-05-05 DIAGNOSIS — Z713 Dietary counseling and surveillance: Secondary | ICD-10-CM | POA: Diagnosis present

## 2017-05-05 DIAGNOSIS — E669 Obesity, unspecified: Secondary | ICD-10-CM

## 2017-05-05 NOTE — Progress Notes (Addendum)
Pre-Op Assessment Visit:  Pre-Operative RYGB Surgery  Medical Nutrition Therapy:  Appt start time: 10:00  End time:  11:00  Patient was seen on 05/05/2017 for Pre-Operative Nutrition Assessment. Assessment and letter of approval faxed to [facility] Bariatric Surgery Program coordinator on 05/05/2017.   Pt expectation of surgery: have more energy, eliminate blood pressure medication, not need CPAP machine, able to play on floor with future grandchildren, prolong life, lose weight  Pt expectation of Dietitian: none stated   Start weight at [facility]: 308.8 BMI: 48.73  Pt states she just found out today that daughter is pregnant and has been stressful with learning of news. Pt states when her son wins his wrestling match, they go out to eat.   Per insurance, pt needs 0 SWL visits with [place] prior to surgery.   24 hr Dietary Recall: First Meal: instant oatmeal Snack: sometimes nuts, carrots, hummus, crackers Second Meal: school cafeteria-pizza Snack: none Third Meal: club sandwich, small amount of tater tots Snack: none Beverages: soda, water, sweet tea, coffee (sometimes)   Encouraged to engage in 75 minutes of moderate physical activity including cardiovascular and weight baring weekly  Handouts given during visit include:  . Pre-Op Goals . Bariatric Surgery Protein Shakes . Vitamin and Mineral Recommendations  During the appointment today the following Pre-Op Goals were reviewed with the patient: . Maintain or lose weight as instructed by your surgeon . Make healthy food choices . Begin to limit portion sizes . Limited concentrated sugars and fried foods . Keep fat/sugar in the single digits per serving on          food labels . Practice CHEWING your food  (aim for 30 chews per bite or until applesauce consistency) . Practice not drinking 15 minutes before, during, and 30 minutes after each meal/snack . Avoid all carbonated beverages  . Avoid/limit caffeinated  beverages  . Avoid all sugar-sweetened beverages . Consume 3 meals per day; eat every 3-5 hours . Make a list of non-food related activities . Aim for 64-100 ounces of FLUID daily  . Aim for at least 60-80 grams of PROTEIN daily . Look for a liquid protein source that contain ?15 g protein and ?5 g carbohydrate  (ex: shakes, drinks, shots)  -Follow diet recommendations listed below   Energy and Macronutrient Recomendations: Calories: 1800 Carbohydrate: 200 Protein: 135 Fat: 50  Demonstrated degree of understanding via:  Teach Back   Teaching Method Utilized:  Visual Auditory Hands on  Barriers to learning/adherence to lifestyle change: none  Patient to call the [facility] to enroll in Pre-Op and Post-Op Nutrition Education when surgery date is scheduled.

## 2017-05-07 ENCOUNTER — Other Ambulatory Visit: Payer: Self-pay | Admitting: Pediatrics

## 2017-05-07 DIAGNOSIS — J40 Bronchitis, not specified as acute or chronic: Secondary | ICD-10-CM

## 2017-05-10 MED ORDER — ALBUTEROL SULFATE HFA 108 (90 BASE) MCG/ACT IN AERS: 2.0000 | INHALATION_SPRAY | Freq: Four times a day (QID) | RESPIRATORY_TRACT | 0 refills | Status: DC | PRN

## 2017-05-29 ENCOUNTER — Other Ambulatory Visit: Payer: Self-pay | Admitting: Pediatrics

## 2017-05-29 DIAGNOSIS — J40 Bronchitis, not specified as acute or chronic: Secondary | ICD-10-CM

## 2017-06-05 ENCOUNTER — Encounter: Payer: BC Managed Care – PPO | Attending: General Surgery | Admitting: Skilled Nursing Facility1

## 2017-06-05 DIAGNOSIS — E669 Obesity, unspecified: Secondary | ICD-10-CM

## 2017-06-05 DIAGNOSIS — Z713 Dietary counseling and surveillance: Secondary | ICD-10-CM | POA: Diagnosis present

## 2017-06-07 NOTE — Progress Notes (Signed)
Pre-Operative Nutrition Class:  Appt start time: 8727   End time:  1830.  Patient was seen on 06/05/2017 for Pre-Operative Bariatric Surgery Education at [facility].   Surgery date:  Surgery type: RYGB Start weight at [facility]: 308.8 Weight today: 308.5  Samples given per MNT protocol. Patient educated on appropriate usage: Bariatric Advantage Multivitamin Lot # [id number] Exp: 07/19  Bariatric Advantage Calcium  Lot # [id number] Exp: Jan 21 2018  Unjury Protein Powder   Lot # [id number] Exp: 04/2019  The following the learning objectives were met by the patient during this course:  Identify Pre-Op Dietary Goals and will begin 2 weeks pre-operatively  Identify appropriate sources of fluids and proteins   State protein recommendations and appropriate sources pre and post-operatively  Identify Post-Operative Dietary Goals and will follow for 2 weeks post-operatively  Identify appropriate multivitamin and calcium sources  Describe the need for physical activity post-operatively and will follow MD recommendations  State when to call healthcare provider regarding medication questions or post-operative complications  Handouts given during class include:  Pre-Op Bariatric Surgery Diet Handout  Protein Shake Handout  Post-Op Bariatric Surgery Nutrition Handout  BELT Program Information Flyer  Support Group Information Flyer  [facility] Bariatric Supplements Price List  Follow-Up Plan: Patient will follow-up at [facility] 2 weeks post operatively for diet advancement per MD.

## 2017-06-12 ENCOUNTER — Encounter: Payer: Self-pay | Admitting: Family

## 2017-06-12 ENCOUNTER — Ambulatory Visit: Payer: BC Managed Care – PPO | Admitting: Family

## 2017-06-12 VITALS — BP 121/82 | HR 78 | Temp 99.2°F | Ht 66.75 in | Wt 306.0 lb

## 2017-06-12 DIAGNOSIS — D234 Other benign neoplasm of skin of scalp and neck: Secondary | ICD-10-CM | POA: Diagnosis not present

## 2017-06-12 DIAGNOSIS — E039 Hypothyroidism, unspecified: Secondary | ICD-10-CM | POA: Diagnosis not present

## 2017-06-12 NOTE — Progress Notes (Signed)
   Subjective:    Patient ID: Heather Hardin, female    DOB: 21-Jul-1975, 42 y.o.   MRN: [id number]  Pt presents to the office today to recheck thyroid and cyst on scalp. Pt continues to have follow up appts with bariatric clinic and is hoping to have weight loss surgery in 2019. Pt has apt with psychologists.  Thyroid Problem  Presents for follow-up visit. Symptoms include fatigue and hoarse voice. Patient reports no depressed mood, diaphoresis, diarrhea, leg swelling, visual change or weight gain. The symptoms have been stable.      Review of Systems  Constitutional: Positive for fatigue. Negative for diaphoresis and weight gain.  HENT: Positive for hoarse voice.   Gastrointestinal: Negative for diarrhea.  All other systems reviewed and are negative.      Objective:   Physical Exam  Constitutional: She is oriented to person, place, and time. She appears well-developed and well-nourished. No distress.  HENT:  Head: Normocephalic.  Eyes: Pupils are equal, round, and reactive to light.  Neck: Normal range of motion. Neck supple. No thyromegaly present.  Cardiovascular: Normal rate, regular rhythm, normal heart sounds and intact distal pulses.  No murmur heard. Pulmonary/Chest: Effort normal and breath sounds normal. No respiratory distress. She has no wheezes.  Abdominal: Soft. Bowel sounds are normal. She exhibits no distension. There is no tenderness.  Musculoskeletal: Normal range of motion. She exhibits no edema or tenderness.  Neurological: She is alert and oriented to person, place, and time.  Skin: Skin is warm and dry.  Cyst on right scalp  Psychiatric: She has a normal mood and affect. Her behavior is normal. Judgment and thought content normal.  Vitals reviewed.   BP 121/82   Pulse 78   Temp 99.2 F (37.3 C) (Oral)   Ht 5' 6.75" (1.695 m)   Wt (!) 306 lb (138.8 kg)   BMI 48.29 kg/m       Assessment & Plan:  1. Hypothyroidism, unspecified type -  CMP14+EGFR - TSH  2. Morbid obesity ([place]) Keep bariatric appts - CMP14+EGFR  3. Dermoid cyst of scalp Do not pick  - CMP14+EGFR - Ambulatory referral to Dermatology    Evelina Dun, FNP

## 2017-06-12 NOTE — Patient Instructions (Signed)
Epidermal Cyst An epidermal cyst is sometimes called an epidermal inclusion cyst or an infundibular cyst. It is a sac made of skin tissue. The sac contains a substance called keratin. Keratin is a protein that is normally secreted through the hair follicles. When keratin becomes trapped in the top layer of skin (epidermis), it can form an epidermal cyst. Epidermal cysts are usually found on the face, neck, trunk, and genitals. These cysts are usually harmless (benign), and they may not cause symptoms unless they become infected. It is important not to pop epidermal cysts yourself. What are the causes? This condition may be caused by:  A blocked hair follicle.  A hair that curls and re-enters the skin instead of growing straight out of the skin (ingrown hair).  A blocked pore.  Irritated skin.  An injury to the skin.  Certain conditions that are passed along from parent to child (inherited).  Human papillomavirus (HPV).  What increases the risk? The following factors may make you more likely to develop an epidermal cyst:  Having acne.  Being overweight.  Wearing tight clothing.  What are the signs or symptoms? The only symptom of this condition may be a small, painless lump underneath the skin. When an epidermal cyst becomes infected, symptoms may include:  Redness.  Inflammation.  Tenderness.  Warmth.  Fever.  Keratin draining from the cyst. Keratin may look like a grayish-white, bad-smelling substance.  Pus draining from the cyst.  How is this diagnosed? This condition is diagnosed with a physical exam. In some cases, you may have a sample of tissue (biopsy) taken from your cyst to be examined under a microscope or tested for bacteria. You may be referred to a health care provider who specializes in skin care (dermatologist). How is this treated? In many cases, epidermal cysts go away on their own without treatment. If a cyst becomes infected, treatment may  include:  Opening and draining the cyst. After draining, minor surgery to remove the rest of the cyst may be done.  Antibiotic medicine to help prevent infection.  Injections of medicines (steroids) that help to reduce inflammation.  Surgery to remove the cyst. Surgery may be done if: ? The cyst becomes large. ? The cyst bothers you. ? There is a chance that the cyst could turn into cancer.  Follow these instructions at home:  Take over-the-counter and prescription medicines only as told by your health care provider.  If you were prescribed an antibiotic, use it as told by your health care provider. Do not stop using the antibiotic even if you start to feel better.  Keep the area around your cyst clean and dry.  Wear loose, dry clothing.  Do not try to pop your cyst.  Avoid touching your cyst.  Check your cyst every day for signs of infection.  Keep all follow-up visits as told by your health care provider. This is important. How is this prevented?  Wear clean, dry, clothing.  Avoid wearing tight clothing.  Keep your skin clean and dry. Shower or take baths every day.  Wash your body with a benzoyl peroxide wash when you shower or bathe. Contact a health care provider if:  Your cyst develops symptoms of infection.  Your condition is not improving or is getting worse.  You develop a cyst that looks different from other cysts you have had.  You have a fever. Get help right away if:  Redness spreads from the cyst into the surrounding area. This information is   not intended to replace advice given to you by your health care provider. Make sure you discuss any questions you have with your health care provider. Document Released: 04/02/2004 Document Revised: 12/30/2015 Document Reviewed: 03/04/2015 Elsevier Interactive Patient Education  2018 [employer]  

## 2017-06-13 ENCOUNTER — Other Ambulatory Visit: Payer: Self-pay | Admitting: *Deleted

## 2017-06-13 ENCOUNTER — Other Ambulatory Visit: Payer: Self-pay | Admitting: Family

## 2017-06-13 DIAGNOSIS — E039 Hypothyroidism, unspecified: Secondary | ICD-10-CM

## 2017-06-13 LAB — CMP14+EGFR
ALT: 46 [IU]/L — ABNORMAL HIGH (ref 0–32)
AST: 29 [IU]/L (ref 0–40)
Albumin/Globulin Ratio: 1.5 (ref 1.2–2.2)
Albumin: 4.4 g/dL (ref 3.5–5.5)
Alkaline Phosphatase: 87 [IU]/L (ref 39–117)
BUN/Creatinine Ratio: 15 (ref 9–23)
BUN: 14 mg/dL (ref 6–24)
Bilirubin Total: 0.2 mg/dL (ref 0.0–1.2)
CO2: 22 mmol/L (ref 20–29)
Calcium: 9.5 mg/dL (ref 8.7–10.2)
Chloride: 102 mmol/L (ref 96–106)
Creatinine, Ser: 0.93 mg/dL (ref 0.57–1.00)
GFR calc Af Amer: 88 mL/min/{1.73_m2} (ref >59)
GFR calc non Af Amer: 76 mL/min/{1.73_m2} (ref >59)
Globulin, Total: 2.9 g/dL (ref 1.5–4.5)
Glucose: 84 mg/dL (ref 65–99)
Potassium: 4.1 mmol/L (ref 3.5–5.2)
Sodium: 140 mmol/L (ref 134–144)
Total Protein: 7.3 g/dL (ref 6.0–8.5)

## 2017-06-13 LAB — TSH: TSH: 9.29 u[IU]/mL — ABNORMAL HIGH (ref 0.450–4.500)

## 2017-06-13 MED ORDER — LEVOTHYROXINE SODIUM 88 MCG PO TABS: 88.0000 ug | ORAL_TABLET | Freq: Every day | ORAL | 11 refills | Status: DC

## 2017-06-20 ENCOUNTER — Encounter: Payer: Self-pay | Admitting: Physician Assistant

## 2017-06-20 ENCOUNTER — Ambulatory Visit (INDEPENDENT_AMBULATORY_CARE_PROVIDER_SITE_OTHER): Payer: BC Managed Care – PPO | Admitting: Physician Assistant

## 2017-06-20 VITALS — BP 127/85 | HR 88 | Temp 99.4°F | Ht 66.75 in | Wt 305.8 lb

## 2017-06-20 DIAGNOSIS — J0110 Acute frontal sinusitis, unspecified: Secondary | ICD-10-CM | POA: Diagnosis not present

## 2017-06-20 DIAGNOSIS — B373 Candidiasis of vulva and vagina: Secondary | ICD-10-CM

## 2017-06-20 DIAGNOSIS — J329 Chronic sinusitis, unspecified: Secondary | ICD-10-CM | POA: Insufficient documentation

## 2017-06-20 DIAGNOSIS — R300 Dysuria: Secondary | ICD-10-CM | POA: Diagnosis not present

## 2017-06-20 DIAGNOSIS — B3731 Acute candidiasis of vulva and vagina: Secondary | ICD-10-CM

## 2017-06-20 LAB — URINALYSIS, COMPLETE
Bilirubin, UA: NEGATIVE
Glucose, UA: NEGATIVE
Nitrite, UA: NEGATIVE
Specific Gravity, UA: >1.03 — ABNORMAL HIGH (ref 1.005–1.030)
Urobilinogen, Ur: 0.2 mg/dL (ref 0.2–1.0)
pH, UA: 5.5 (ref 5.0–7.5)

## 2017-06-20 LAB — MICROSCOPIC EXAMINATION: Epithelial Cells (non renal): >10 /[HPF] — AB (ref 0–10)

## 2017-06-20 MED ORDER — FLUCONAZOLE 150 MG PO TABS: ORAL_TABLET | ORAL | 0 refills | Status: DC

## 2017-06-20 MED ORDER — AMOXICILLIN 500 MG PO CAPS: 1000.0000 mg | ORAL_CAPSULE | Freq: Two times a day (BID) | ORAL | 0 refills | Status: DC

## 2017-06-20 NOTE — Progress Notes (Signed)
BP 127/85   Pulse 88   Temp 99.4 F (37.4 C) (Oral)   Ht 5' 6.75" (1.695 m)   Wt (!) 305 lb 12.8 oz (138.7 kg)   BMI 48.25 kg/m    Subjective:    Patient ID: Heather Hardin, female    DOB: February 07, 1976, 42 y.o.   MRN: [id number]  HPI: Heather Hardin is a 42 y.o. female presenting on 06/20/2017 for Vaginal Itching; Dysuria; and Sinusitis This patient has had many days of sinus headache and postnasal drainage. There is copious drainage at times. Denies any fever at this time. There has been a history of sinus infections in the past.  No history of sinus surgery. There is cough at night. It has become more prevalent in recent days. This patient has had several days of dysuria, frequency and no nocturia. There is also pain over the bladder in the suprapubic region, no back pain. Denies leakage or hematuria.  Denies fever or chills. No pain in flank area.  Past Medical History:  Diagnosis Date  . Hypertension   . Thyroid disease    Relevant past medical, surgical, family and social history reviewed and updated as indicated. Interim medical history since our last visit reviewed. Allergies and medications reviewed and updated. DATA REVIEWED: CHART IN EPIC  Family History reviewed for pertinent findings.  Review of Systems  Constitutional: Positive for chills and fatigue. Negative for activity change and appetite change.  HENT: Positive for congestion, postnasal drip, sinus pressure, sinus pain and sore throat.   Eyes: Negative.   Respiratory: Positive for cough and wheezing.   Cardiovascular: Negative.  Negative for chest pain, palpitations and leg swelling.  Gastrointestinal: Negative.   Genitourinary: Positive for dysuria.  Musculoskeletal: Negative.   Skin: Negative.   Neurological: Positive for headaches.    Allergies as of 06/20/2017   No Known Allergies     Medication List        Accurate as of 06/20/17  6:39 PM. Always use your most recent med list.            albuterol 108 (90 Base) MCG/ACT inhaler Commonly known as:  PROVENTIL HFA;VENTOLIN HFA TAKE 2 PUFFS BY MOUTH EVERY 6 HOURS AS NEEDED FOR WHEEZE OR SHORTNESS OF BREATH   amoxicillin 500 MG capsule Commonly known as:  AMOXIL Take 2 capsules (1,000 mg total) by mouth 2 (two) times daily.   escitalopram 20 MG tablet Commonly known as:  LEXAPRO Take 1 tablet (20 mg total) by mouth daily.   FISH OIL BURP-LESS PO Take 1 capsule by mouth daily.   fluconazole 150 MG tablet Commonly known as:  DIFLUCAN 1 po q week x 4 weeks   levothyroxine 88 MCG tablet Commonly known as:  SYNTHROID Take 1 tablet (88 mcg total) by mouth daily.   lisinopril-hydrochlorothiazide 20-12.5 MG tablet Commonly known as:  PRINZIDE,ZESTORETIC TAKE 1 TABLET BY MOUTH DAILY   Melatonin 3 MG Subl Place 3 mg under the tongue at bedtime as needed (sleep).   multivitamin with minerals tablet Take 1 tablet by mouth daily.   Spacer/Aero Chamber Mouthpiece Misc 1 each by Does not apply route every 6 (six) hours as needed.   Vitamin D (Ergocalciferol) 50000 units Caps capsule Commonly known as:  DRISDOL Take 1 capsule (50,000 Units total) by mouth every 7 (seven) days.          Objective:    BP 127/85   Pulse 88   Temp 99.4 F (37.4 C) (Oral)  Ht 5' 6.75" (1.695 m)   Wt (!) 305 lb 12.8 oz (138.7 kg)   BMI 48.25 kg/m   No Known Allergies  Wt Readings from Last 3 Encounters:  06/20/17 (!) 305 lb 12.8 oz (138.7 kg)  06/12/17 (!) 306 lb (138.8 kg)  05/05/17 (!) 308 lb 12.8 oz (140.1 kg)    Physical Exam  Constitutional: She is oriented to person, place, and time. She appears well-developed and well-nourished.  HENT:  Head: Normocephalic and atraumatic.  Right Ear: Tympanic membrane and external ear normal. No middle ear effusion.  Left Ear: Tympanic membrane and external ear normal.  No middle ear effusion.  Nose: Mucosal edema and rhinorrhea present. Right sinus exhibits no maxillary sinus  tenderness. Left sinus exhibits no maxillary sinus tenderness.  Mouth/Throat: Uvula is midline. Posterior oropharyngeal erythema present.  Eyes: Conjunctivae and EOM are normal. Pupils are equal, round, and reactive to light. Right eye exhibits no discharge. Left eye exhibits no discharge.  Neck: Normal range of motion.  Cardiovascular: Normal rate, regular rhythm and normal heart sounds.  Pulmonary/Chest: Effort normal and breath sounds normal. No respiratory distress. She has no wheezes.  Abdominal: Soft. There is no tenderness. There is no rebound.  Lymphadenopathy:    She has no cervical adenopathy.  Neurological: She is alert and oriented to person, place, and time.  Skin: Skin is warm and dry.  Psychiatric: She has a normal mood and affect.  Nursing note and vitals reviewed.       Assessment & Plan:   1. Dysuria - Urine Culture - Urinalysis, Complete - Microscopic Examination  2. Candidiasis of genitalia in female - fluconazole (DIFLUCAN) 150 MG tablet; 1 po q week x 4 weeks  Dispense: 4 tablet; Refill: 0  3. Acute non-recurrent frontal sinusitis - amoxicillin (AMOXIL) 500 MG capsule; Take 2 capsules (1,000 mg total) by mouth 2 (two) times daily.  Dispense: 40 capsule; Refill: 0   Continue all other maintenance medications as listed above.  Follow up plan: No Follow-up on file.  Educational handout given for [place] PA-C [place] [address]  [place], [place] [address] [phone]   06/20/2017, 6:39 PM

## 2017-06-21 LAB — URINE CULTURE

## 2017-08-03 ENCOUNTER — Ambulatory Visit: Payer: Self-pay | Admitting: Adult Health

## 2017-08-04 ENCOUNTER — Encounter: Payer: Self-pay | Admitting: Adult Health

## 2017-08-21 ENCOUNTER — Other Ambulatory Visit: Payer: Self-pay | Admitting: Family

## 2017-08-21 DIAGNOSIS — F411 Generalized anxiety disorder: Secondary | ICD-10-CM

## 2017-08-21 DIAGNOSIS — F32A Depression, unspecified: Secondary | ICD-10-CM

## 2017-08-21 DIAGNOSIS — F329 Major depressive disorder, single episode, unspecified: Secondary | ICD-10-CM

## 2017-08-22 ENCOUNTER — Ambulatory Visit: Payer: BC Managed Care – PPO | Admitting: Family

## 2017-08-22 MED ORDER — ESCITALOPRAM OXALATE 20 MG PO TABS: 20.0000 mg | ORAL_TABLET | Freq: Every day | ORAL | 0 refills | Status: DC

## 2017-09-11 ENCOUNTER — Encounter: Payer: Self-pay | Admitting: Family

## 2017-09-11 ENCOUNTER — Ambulatory Visit (INDEPENDENT_AMBULATORY_CARE_PROVIDER_SITE_OTHER): Payer: BC Managed Care – PPO | Admitting: Family

## 2017-09-11 VITALS — BP 123/89 | HR 72 | Temp 98.8°F | Ht 66.5 in | Wt 309.2 lb

## 2017-09-11 DIAGNOSIS — E559 Vitamin D deficiency, unspecified: Secondary | ICD-10-CM

## 2017-09-11 DIAGNOSIS — E039 Hypothyroidism, unspecified: Secondary | ICD-10-CM

## 2017-09-11 DIAGNOSIS — F331 Major depressive disorder, recurrent, moderate: Secondary | ICD-10-CM | POA: Diagnosis not present

## 2017-09-11 DIAGNOSIS — I10 Essential (primary) hypertension: Secondary | ICD-10-CM | POA: Diagnosis not present

## 2017-09-11 DIAGNOSIS — G4733 Obstructive sleep apnea (adult) (pediatric): Secondary | ICD-10-CM | POA: Diagnosis not present

## 2017-09-11 DIAGNOSIS — E781 Pure hyperglyceridemia: Secondary | ICD-10-CM

## 2017-09-11 DIAGNOSIS — F411 Generalized anxiety disorder: Secondary | ICD-10-CM

## 2017-09-11 NOTE — Patient Instructions (Signed)

## 2017-09-11 NOTE — Progress Notes (Signed)
Subjective:    Patient ID: Heather Hardin, female    DOB: 04/24/1976, 42 y.o.   MRN: [id number]  Pt presents to the office today for chronic follow up. PT is schedule for Bypass surgery on 10/23/17.  Hypertension  This is a chronic problem. The current episode started more than 1 year ago. The problem has been resolved since onset. The problem is controlled. Associated symptoms include anxiety and malaise/fatigue. Pertinent negatives include no headaches, peripheral edema or shortness of breath. Risk factors for coronary artery disease include dyslipidemia and obesity. The current treatment provides moderate improvement. There is no history of CAD/MI or heart failure. Identifiable causes of hypertension include a thyroid problem.  Depression         This is a chronic problem.  The current episode started more than 1 year ago.   The onset quality is gradual.   The problem occurs intermittently.  Associated symptoms include decreased concentration and fatigue.  Associated symptoms include no helplessness, no hopelessness, not irritable, no restlessness, no headaches and not sad.  Past treatments include SSRIs - Selective serotonin reuptake inhibitors.  Compliance with treatment is good.  Past medical history includes thyroid problem and anxiety.   Anxiety  Presents for follow-up visit. Symptoms include decreased concentration, excessive worry, irritability and nervous/anxious behavior. Patient reports no depressed mood, restlessness or shortness of breath. Symptoms occur most days. The severity of symptoms is moderate. The quality of sleep is good.    Hyperlipidemia  This is a chronic problem. The current episode started more than 1 year ago. The problem is uncontrolled. Recent lipid tests were reviewed and are normal. Exacerbating diseases include obesity. Pertinent negatives include no shortness of breath. Current antihyperlipidemic treatment includes diet change. The current treatment provides  mild improvement of lipids.  Thyroid Problem  Presents for follow-up visit. Symptoms include anxiety and fatigue. Patient reports no depressed mood, diarrhea, dry skin or visual change. The symptoms have been stable. Her past medical history is significant for hyperlipidemia. There is no history of heart failure.  OSA States she uses her CPAP every now and then.     Review of Systems  Constitutional: Positive for fatigue, irritability and malaise/fatigue.  Respiratory: Negative for shortness of breath.   Gastrointestinal: Negative for diarrhea.  Neurological: Negative for headaches.  Psychiatric/Behavioral: Positive for decreased concentration and depression. The patient is nervous/anxious.   All other systems reviewed and are negative.      Objective:   Physical Exam  Constitutional: She is oriented to person, place, and time. She appears well-developed and well-nourished. She is not irritable. No distress.  HENT:  Head: Normocephalic and atraumatic.  Right Ear: External ear normal.  Left Ear: External ear normal.  Mouth/Throat: Oropharynx is clear and moist.  Eyes: Pupils are equal, round, and reactive to light.  Neck: Normal range of motion. Neck supple. No thyromegaly present.  Cardiovascular: Normal rate, regular rhythm, normal heart sounds and intact distal pulses.  No murmur heard. Pulmonary/Chest: Effort normal and breath sounds normal. No respiratory distress. She has no wheezes.  Abdominal: Soft. Bowel sounds are normal. She exhibits no distension. There is no tenderness.  Musculoskeletal: Normal range of motion. She exhibits no edema or tenderness.  Neurological: She is alert and oriented to person, place, and time. She has normal reflexes. No cranial nerve deficit.  Skin: Skin is warm and dry.  Psychiatric: She has a normal mood and affect. Her behavior is normal. Judgment and thought content normal.  Vitals reviewed.  BP 123/89   Pulse 72   Temp 98.8 F  (37.1 C) (Oral)   Ht 5' 6.5" (1.689 m)   Wt (!) 309 lb 3.2 oz (140.3 kg)   BMI 49.16 kg/m      Assessment & Plan:  1. Essential hypertension - CMP14+EGFR  2. OSA (obstructive sleep apnea) - CMP14+EGFR  3. Hypothyroidism, unspecified type  - CMP14+EGFR - TSH  4. Vitamin D deficiency - CMP14+EGFR - VITAMIN D 25 Hydroxy (Vit-D Deficiency, Fractures)  5. GAD (generalized anxiety disorder)  - CMP14+EGFR  6. Moderate episode of recurrent major depressive disorder (HCC)  - CMP14+EGFR  7. Morbid obesity (HCC) - CMP14+EGFR  8. Hypertriglyceridemia  - CMP14+EGFR - Lipid panel   Continue all meds Labs pending Health Maintenance reviewed Diet and exercise encouraged RTO 6 months   Evelina Dun, FNP

## 2017-09-12 ENCOUNTER — Other Ambulatory Visit: Payer: Self-pay | Admitting: Family

## 2017-09-12 DIAGNOSIS — E785 Hyperlipidemia, unspecified: Secondary | ICD-10-CM | POA: Insufficient documentation

## 2017-09-12 DIAGNOSIS — E782 Mixed hyperlipidemia: Secondary | ICD-10-CM

## 2017-09-12 LAB — CMP14+EGFR
ALT: 46 [IU]/L — ABNORMAL HIGH (ref 0–32)
AST: 31 [IU]/L (ref 0–40)
Albumin/Globulin Ratio: 1.4 (ref 1.2–2.2)
Albumin: 4.3 g/dL (ref 3.5–5.5)
Alkaline Phosphatase: 85 [IU]/L (ref 39–117)
BUN/Creatinine Ratio: 9 (ref 9–23)
BUN: 9 mg/dL (ref 6–24)
Bilirubin Total: 0.2 mg/dL (ref 0.0–1.2)
CO2: 23 mmol/L (ref 20–29)
Calcium: 9.5 mg/dL (ref 8.7–10.2)
Chloride: 103 mmol/L (ref 96–106)
Creatinine, Ser: 0.97 mg/dL (ref 0.57–1.00)
GFR calc Af Amer: 83 mL/min/{1.73_m2} (ref >59)
GFR calc non Af Amer: 72 mL/min/{1.73_m2} (ref >59)
Globulin, Total: 3 g/dL (ref 1.5–4.5)
Glucose: 84 mg/dL (ref 65–99)
Potassium: 3.9 mmol/L (ref 3.5–5.2)
Sodium: 141 mmol/L (ref 134–144)
Total Protein: 7.3 g/dL (ref 6.0–8.5)

## 2017-09-12 LAB — VITAMIN D 25 HYDROXY (VIT D DEFICIENCY, FRACTURES): Vit D, 25-Hydroxy: 26 ng/mL — ABNORMAL LOW (ref 30.0–100.0)

## 2017-09-12 LAB — LIPID PANEL
Chol/HDL Ratio: 5 {ratio} — ABNORMAL HIGH (ref 0.0–4.4)
Cholesterol, Total: 191 mg/dL (ref 100–199)
HDL: 38 mg/dL — ABNORMAL LOW (ref >39)
LDL Calculated: 110 mg/dL — ABNORMAL HIGH (ref 0–99)
Triglycerides: 214 mg/dL — ABNORMAL HIGH (ref 0–149)
VLDL Cholesterol Cal: 43 mg/dL — ABNORMAL HIGH (ref 5–40)

## 2017-09-12 LAB — TSH: TSH: 13.54 u[IU]/mL — ABNORMAL HIGH (ref 0.450–4.500)

## 2017-09-12 MED ORDER — LEVOTHYROXINE SODIUM 100 MCG PO TABS: 100.0000 ug | ORAL_TABLET | Freq: Every day | ORAL | 11 refills | Status: DC

## 2017-09-12 MED ORDER — VITAMIN D (ERGOCALCIFEROL) 1.25 MG (50000 UNIT) PO CAPS: 50000.0000 [IU] | ORAL_CAPSULE | ORAL | 1 refills | Status: DC

## 2017-09-14 ENCOUNTER — Other Ambulatory Visit: Payer: Self-pay | Admitting: Family

## 2017-10-18 NOTE — Patient Instructions (Addendum)
Angelisa Winthrop  10/18/2017   Your procedure is scheduled on: 10-23-17   Report to [facility] Main  Entrance    Report to admitting at 5:30AM    Call this number if you have problems the morning of surgery [phone]     Remember: MORNING OF SURGERY DRINK:  [place], DRINK ALL OF THE SHAKE AT ONE TIME.   NO SOLID FOOD AFTER 600 PM THE NIGHT BEFORE YOUR SURGERY. YOU MAY DRINK CLEAR FLUIDS. THE SHAKE YOU DRINK BEFORE YOU LEAVE HOME WILL BE THE LAST FLUIDS YOU DRINK BEFORE SURGERY.    PAIN IS EXPECTED AFTER SURGERY AND WILL NOT BE COMPLETELY ELIMINATED. AMBULATION AND TYLENOL WILL HELP REDUCE INCISIONAL AND GAS PAIN. MOVEMENT IS KEY!  YOU ARE EXPECTED TO BE OUT OF BED WITHIN 4 HOURS OF ADMISSION TO YOUR PATIENT ROOM.  SITTING IN THE RECLINER THROUGHOUT THE DAY IS IMPORTANT FOR DRINKING FLUIDS AND MOVING GAS THROUGHOUT THE GI TRACT.  COMPRESSION STOCKINGS SHOULD BE WORN [place] UNLESS YOU ARE WALKING.   INCENTIVE SPIROMETER SHOULD BE USED EVERY HOUR WHILE AWAKE TO DECREASE POST-OPERATIVE COMPLICATIONS SUCH AS PNEUMONIA.  WHEN DISCHARGED HOME, IT IS IMPORTANT TO CONTINUE TO WALK EVERY HOUR AND USE THE INCENTIVE SPIROMETER EVERY HOUR.       PLEASE BRING CPAP MASK AND  TUBING ONLY. DEVICE WILL BE PROVIDED!   Take these medicines the morning of surgery with A SIP OF WATER: ESCITALOPRAM, LEVOTHYROXINE, ALBUTEROL INHALER IF NEEDED                                You may not have any metal on your body including hair pins and              piercings  Do not wear jewelry, make-up, lotions, powders or perfumes, deodorant             Do not wear nail polish.  Do not shave  48 hours prior to surgery.     Do not bring valuables to the hospital. [place].  Contacts, dentures or bridgework may not be worn into surgery.  Leave suitcase in the car. After surgery it may be  brought to your room.                Please read over the following fact sheets you were given: _____________________________________________________________________             [facility] - Preparing for Surgery Before surgery, you can play an important role.  Because skin is not sterile, your skin needs to be as free of germs as possible.  You can reduce the number of germs on your skin by washing with CHG (chlorahexidine gluconate) soap before surgery.  CHG is an antiseptic cleaner which kills germs and bonds with the skin to continue killing germs even after washing. Please DO NOT use if you have an allergy to CHG or antibacterial soaps.  If your skin becomes reddened/irritated stop using the CHG and inform your nurse when you arrive at Short Stay. Do not shave (including legs and underarms) for at least 48 hours prior to the first CHG shower.  You may shave your face/neck. Please follow these instructions carefully:  1.  Shower with CHG Soap the night before surgery and the  morning of Surgery.  2.  If you choose to wash your hair, wash your hair first as usual with your  normal  shampoo.  3.  After you shampoo, rinse your hair and body thoroughly to remove the  shampoo.                           4.  Use CHG as you would any other liquid soap.  You can apply chg directly  to the skin and wash                       Gently with a scrungie or clean washcloth.  5.  Apply the CHG Soap to your body ONLY FROM THE NECK DOWN.   Do not use on face/ open                           Wound or open sores. Avoid contact with eyes, ears mouth and genitals (private parts).                       Wash face,  Genitals (private parts) with your normal soap.             6.  Wash thoroughly, paying special attention to the area where your surgery  will be performed.  7.  Thoroughly rinse your body with warm water from the neck down.  8.  DO NOT shower/wash with your normal soap after using and rinsing off  the  CHG Soap.                9.  Pat yourself dry with a clean towel.            10.  Wear clean pajamas.            11.  Place clean sheets on your bed the night of your first shower and do not  sleep with pets. Day of Surgery : Do not apply any lotions/deodorants the morning of surgery.  Please wear clean clothes to the hospital/surgery center.  FAILURE TO FOLLOW THESE INSTRUCTIONS MAY RESULT IN THE CANCELLATION OF YOUR SURGERY PATIENT SIGNATURE_________________________________  NURSE SIGNATURE__________________________________  ________________________________________________________________________   Adam [place]ix  An incentive spirometer is a tool that can help keep your lungs clear and active. This tool measures how well you are filling your lungs with each breath. Taking long deep breaths may help reverse or decrease the chance of developing breathing (pulmonary) problems (especially infection) following:  A long period of time when you are unable to move or be active. BEFORE THE PROCEDURE   If the spirometer includes an indicator to show your best effort, your nurse or respiratory therapist will set it to a desired goal.  If possible, sit up straight or lean slightly forward. Try not to slouch.  Hold the incentive spirometer in an upright position. INSTRUCTIONS FOR USE  1. Sit on the edge of your bed if possible, or sit up as far as you can in bed or on a chair. 2. Hold the incentive spirometer in an upright position. 3. Breathe out normally. 4. Place the mouthpiece in your mouth and seal your lips tightly around it. 5. Breathe in slowly and as deeply as possible, raising the piston or the ball toward the top of the column.  6. Hold your breath for 3-5 seconds or for as long as possible. Allow the piston or ball to fall to the bottom of the column. 7. Remove the mouthpiece from your mouth and breathe out normally. 8. Rest for a few seconds and repeat Steps 1 through 7 at  least 10 times every 1-2 hours when you are awake. Take your time and take a few normal breaths between deep breaths. 9. The spirometer may include an indicator to show your best effort. Use the indicator as a goal to work toward during each repetition. 10. After each set of 10 deep breaths, practice coughing to be sure your lungs are clear. If you have an incision (the cut made at the time of surgery), support your incision when coughing by placing a pillow or rolled up towels firmly against it. Once you are able to get out of bed, walk around indoors and cough well. You may stop using the incentive spirometer when instructed by your caregiver.  RISKS AND COMPLICATIONS  Take your time so you do not get dizzy or light-headed.  If you are in pain, you may need to take or ask for pain medication before doing incentive spirometry. It is harder to take a deep breath if you are having pain. AFTER USE  Rest and breathe slowly and easily.  It can be helpful to keep track of a log of your progress. Your caregiver can provide you with a simple table to help with this. If you are using the spirometer at home, follow these instructions: [place] IF:   You are having difficultly using the spirometer.  You have trouble using the spirometer as often as instructed.  Your pain medication is not giving enough relief while using the spirometer.  You develop fever of 100.5 F (38.1 C) or higher. SEEK IMMEDIATE MEDICAL CARE IF:   You cough up bloody sputum that had not been present before.  You develop fever of 102 F (38.9 C) or greater.  You develop worsening pain at or near the incision site. MAKE SURE YOU:   Understand these instructions.  Will watch your condition.  Will get help right away if you are not doing well or get worse. Document Released: 09/12/2006 Document Revised: 07/25/2011 Document Reviewed: 11/13/2006 [facility] Patient Information 2014 [place],  [place].   ________________________________________________________________________

## 2017-10-18 NOTE — Progress Notes (Signed)
Please place orders in epic STAT. PAT appt is 10-19-17 !

## 2017-10-18 NOTE — Progress Notes (Signed)
ekg 04-10-17 epic   cxr 04-10-17 epic

## 2017-10-19 ENCOUNTER — Encounter (HOSPITAL_COMMUNITY)
Admission: RE | Admit: 2017-10-19 | Discharge: 2017-10-19 | Disposition: A | Discharge status: Home / Self Care | Payer: BC Managed Care – PPO | Source: Ambulatory Visit | Attending: General Surgery | Admitting: General Surgery

## 2017-10-19 ENCOUNTER — Encounter (HOSPITAL_COMMUNITY): Payer: Self-pay

## 2017-10-19 ENCOUNTER — Ambulatory Visit: Payer: Self-pay | Admitting: General Surgery

## 2017-10-19 ENCOUNTER — Other Ambulatory Visit: Payer: Self-pay

## 2017-10-19 DIAGNOSIS — Z01818 Encounter for other preprocedural examination: Secondary | ICD-10-CM | POA: Diagnosis present

## 2017-10-19 HISTORY — DX: Depression, unspecified: F32.A

## 2017-10-19 HISTORY — DX: Nausea with vomiting, unspecified: R11.2

## 2017-10-19 HISTORY — DX: Gastro-esophageal reflux disease without esophagitis: K21.9

## 2017-10-19 HISTORY — DX: Sleep apnea, unspecified: G47.30

## 2017-10-19 HISTORY — DX: Other specified postprocedural states: Z98.890

## 2017-10-19 HISTORY — DX: Family history of other specified conditions: Z84.89

## 2017-10-19 HISTORY — DX: Anemia, unspecified: D64.9

## 2017-10-19 HISTORY — DX: Major depressive disorder, single episode, unspecified: F32.9

## 2017-10-19 LAB — CBC WITH DIFFERENTIAL/PLATELET
Basophils Absolute: 0 10*3/uL (ref 0.0–0.1)
Basophils Relative: 1 %
Eosinophils Absolute: 0.1 10*3/uL (ref 0.0–0.7)
Eosinophils Relative: 2 %
HCT: 41.4 % (ref 36.0–46.0)
Hemoglobin: 14.2 g/dL (ref 12.0–15.0)
Lymphocytes Relative: 40 %
Lymphs Abs: 1.9 10*3/uL (ref 0.7–4.0)
MCH: 30.5 pg (ref 26.0–34.0)
MCHC: 34.3 g/dL (ref 30.0–36.0)
MCV: 88.8 fL (ref 78.0–100.0)
Monocytes Absolute: 0.3 10*3/uL (ref 0.1–1.0)
Monocytes Relative: 6 %
Neutro Abs: 2.4 10*3/uL (ref 1.7–7.7)
Neutrophils Relative %: 51 %
Platelets: 303 10*3/uL (ref 150–400)
RBC: 4.66 MIL/uL (ref 3.87–5.11)
RDW: 12.6 % (ref 11.5–15.5)
WBC: 4.7 10*3/uL (ref 4.0–10.5)

## 2017-10-19 LAB — COMPREHENSIVE METABOLIC PANEL
ALT: 59 U/L — ABNORMAL HIGH (ref 14–54)
AST: 37 U/L (ref 15–41)
Albumin: 4.3 g/dL (ref 3.5–5.0)
Alkaline Phosphatase: 59 U/L (ref 38–126)
Anion gap: 8 (ref 5–15)
BUN: 19 mg/dL (ref 6–20)
CO2: 25 mmol/L (ref 22–32)
Calcium: 9.3 mg/dL (ref 8.9–10.3)
Chloride: 105 mmol/L (ref 101–111)
Creatinine, Ser: 0.93 mg/dL (ref 0.44–1.00)
GFR calc Af Amer: >60 mL/min (ref >60)
GFR calc non Af Amer: >60 mL/min (ref >60)
Glucose, Bld: 89 mg/dL (ref 65–99)
Potassium: 3.9 mmol/L (ref 3.5–5.1)
Sodium: 138 mmol/L (ref 135–145)
Total Bilirubin: 0.4 mg/dL (ref 0.3–1.2)
Total Protein: 7.7 g/dL (ref 6.5–8.1)

## 2017-10-19 LAB — ABO/RH: ABO/RH(D): O POS

## 2017-10-20 NOTE — H&P (Signed)
History of Present Illness Marland Kitchen T. Kiyonna Tortorelli MD; 10/19/2017 12:24 PM) The patient is a 42 year old female who presents with obesity. She returns for her preoperative visit prior to planned laparoscopic Roux-en-Y gastric bypass for morbid obesity and multiple comorbidities. Patient is referred by Dr Lenna Gilford for consideration for surgical treatment for morbid obesity. The patient gives a history of progressive obesity since the birth of her third child almost 20 years ago despite multiple attempts at medical management. She had a difficult childhood with her mother being a cocaine addict. She states she did have obesity as a child. 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 She however has been drug free for over 20 years. She has been through multiple efforts at trying to lose weight including medications and physician supervised diet plans over the years and has been able to lose up to 40 pounds at a time but then experiences progressive weight regain. Obesity has been affecting the patient in a number of ways including worsening fatigue and increasing difficulty with daily activities and enjoy time with her family. Significant co-morbid illnesses have developed including obstructive sleep apnea requiring CPAP, hypertension, GERD, and metabolic syndrome with hypertriglyceridemia. She is very concerned about her long-term health outlook going forward at her current weight.   She has successfully completed her preoperative workup. No concerns on psychological nutrition evaluation. Upper GI series was unremarkable. H pylori negative. Reviewed lab work which was unremarkable. She has started her preoperative diet.   Problem List/Past Medical Marland Kitchen T. Mikhael Hendriks, MD; 10/19/2017 12:24 PM) CHRONIC GERD (F79.0)  METABOLIC SYNDROME (W40.97)  HYPERTENSION, ESSENTIAL (I10)  MORBID OBESITY WITH BMI OF 45.0-49.9, ADULT (E66.01)   OBSTRUCTIVE SLEEP APNEA, ADULT (G47.33)   Past Surgical History Marland Kitchen T. Blannie Shedlock, MD; 10/19/2017 12:24 PM) Cesarean Section - Multiple   Diagnostic Studies History Marland Kitchen T. Madellyn Denio, MD; 10/19/2017 12:24 PM) Colonoscopy  never Mammogram  1-3 years ago Pap Smear  1-5 years ago  Allergies Marland Kitchen T. Edwardo Wojnarowski, MD; 10/19/2017 12:24 PM) No Known Drug Allergies [03/17/2017]:  Medication History Marland Kitchen T. Mellany Dinsmore, MD; 10/19/2017 12:24 PM) OxyCODONE HCl (5MG /5ML Solution, 5-10 Oral every four hours, as needed, Taken starting 10/19/2017) Active. Protonix (40MG  Tablet DR, 1 (one) Oral daily, Taken starting 10/19/2017) Active. Ondansetron (4MG  Tablet Disint, 1 (one) Oral every six hours, as needed, Taken starting 10/19/2017) Active. Escitalopram Oxalate (20MG  Tablet, Oral) Active. Levothyroxine Sodium (50MCG Tablet, Oral) Active. Lisinopril-Hydrochlorothiazide (20-12.5MG  Tablet, Oral) Active. ProAir HFA (108 (90 Base)MCG/ACT Aerosol Soln, Inhalation) Active. Vitamin D (Ergocalciferol) (50000UNIT Capsule, Oral) Active. Omega 3 (1000MG  Capsule, Oral) Active.  Social History Marland Kitchen T. Noeli Lavery, MD; 10/19/2017 12:24 PM) Alcohol use  Occasional alcohol use. Caffeine use  Carbonated beverages, Coffee, Tea. Illicit drug use  Remotely quit drug use. Tobacco use  Former smoker.  Family History Marland Kitchen T. Mohammad Granade, MD; 10/19/2017 12:24 PM) Alcohol Abuse  Mother. Anesthetic complications  Son. Arthritis  Mother. Bleeding disorder  Mother. Cancer  Father. Depression  Brother, Mother, Son. Heart Disease  Mother. Hypertension  Mother. Migraine Headache  Mother. Respiratory Condition  Mother.  Pregnancy / Birth History Marland Kitchen T. Samayah Novinger, MD; 10/19/2017 12:24 PM) Age at menarche  48 years. Contraceptive History  Depo-provera, Oral contraceptives. Gravida  4 Length (months) of breastfeeding  3-6 Maternal age  34-20 Para  3  Other Problems  Marland Kitchen T. Carrye Goller, MD; 10/19/2017 12:24 PM) Anxiety Disorder  Back Pain  Depression  Gastroesophageal Reflux  Disease  High blood pressure  Hypercholesterolemia  Sleep Apnea  Thyroid Disease     Physical Exam Marland Kitchen T. Ena Demary MD; 10/19/2017 12:24 PM) The physical exam findings are as follows: Note:General: Alert, morbidly obese Caucasian female, in no distress Skin: Warm and dry without rash or infection. HEENT: No palpable masses or thyromegaly. Sclera nonicteric. Pupils equal round and reactive. Lymph nodes: No cervical, supraclavicular, nodes palpable. Lungs: Breath sounds clear and equal. No wheezing or increased work of breathing. Cardiovascular: Regular rate and rhythm without murmer. No JVD or edema. Peripheral pulses intact. No carotid bruits. Abdomen: Nondistended. Soft and nontender. No masses palpable. No organomegaly. No palpable hernias. Extremities: No edema or joint swelling or deformity. No chronic venous stasis changes. Neurologic: Alert and fully oriented. Gait normal. No focal weakness. Psychiatric: Normal mood and affect. Thought content appropriate with normal judgement and insight    Assessment & Plan Marland Kitchen T. Airyanna Dipalma MD; 10/19/2017 12:25 PM) MORBID OBESITY WITH BMI OF 45.0-49.9, ADULT (E66.01) Impression: Patient with progressive morbid obesity unresponsive to multiple efforts at medical management who presents with a BMI of 48 and comorbidities of obstructive sleep apnea, metabolic syndrome, hypertension and GERD. I believe there would be very significant medical benefit from surgical weight loss. After our discussion of surgical options currently available the patient has decided to proceed with laparoscopic Roux-en-Y gastric bypass due to its beneficial effects on GERD and the possibility that sleeve could worsen her GERD.. We have discussed the nature of the problem and the risks of remaining morbidly obese. We discussed laparoscopic Roux-en-Y  gastric bypass in detail including the nature of the procedure, expected hospitalization and recovery, and major risks of anesthetic complications, bleeding, blood clots and pulmonary emboli, leakage and infection and long-term risks of stricture, ulceration, bowel obstruction, nutritional deficiencies, and failure to lose weight or weight regain. We discussed these problems could lead to death. The patient was given a complete consent form to review and all questions were answered. Successfully completed her preoperative workup and ready to proceed with surgery. She is given prescriptions for nausea medication and Protonix and pain medication. OBSTRUCTIVE SLEEP APNEA, ADULT (G47.33) HYPERTENSION, ESSENTIAL (I10) CHRONIC GERD (F29.0) METABOLIC SYNDROME (S11.15)

## 2017-10-22 MED ORDER — BUPIVACAINE LIPOSOME 1.3 % IJ SUSP
20.0000 mL | INTRAMUSCULAR | Status: DC
  Filled 2017-10-22: qty 20

## 2017-10-22 NOTE — Anesthesia Preprocedure Evaluation (Addendum)
Anesthesia Evaluation  Patient identified by MRN, date of birth, ID band Patient awake  General Assessment Comment:Family history of adverse reaction to anesthesia  reports son is slow to wake    Reviewed: Allergy & Precautions, H&P , NPO status , Patient's Chart, lab work & pertinent test results, reviewed documented beta blocker date and time   History of Anesthesia Complications (+) PONV and history of anesthetic complications  Airway Mallampati: III  TM Distance: >3 FB Neck ROM: full    Dental no notable dental hx. (+) Teeth Intact   Pulmonary sleep apnea and Continuous Positive Airway Pressure Ventilation , former smoker,    Pulmonary exam normal breath sounds clear to auscultation       Cardiovascular Exercise Tolerance: Good hypertension, Pt. on medications  Rhythm:regular Rate:Normal     Neuro/Psych PSYCHIATRIC DISORDERS Anxiety Depression negative neurological ROS     GI/Hepatic Neg liver ROS, GERD  Controlled,  Endo/Other  Hypothyroidism Morbid obesity  Renal/GU      Musculoskeletal   Abdominal   Peds  Hematology  (+) anemia ,   Anesthesia Other Findings   Reproductive/Obstetrics                            Anesthesia Physical Anesthesia Plan  ASA: III  Anesthesia Plan: General   Post-op Pain Management:    Induction: Intravenous  PONV Risk Score and Plan: 3 and Ondansetron, Dexamethasone, Treatment may vary due to age or medical condition, Propofol infusion, Scopolamine patch - Pre-op and Midazolam  Airway Management Planned: Oral ETT  Additional Equipment:   Intra-op Plan:   Post-operative Plan: Extubation in OR  Informed Consent: I have reviewed the patients History and Physical, chart, labs and discussed the procedure including the risks, benefits and alternatives for the proposed anesthesia with the patient or authorized representative who has indicated his/her  understanding and acceptance.   Dental Advisory Given  Plan Discussed with: CRNA, Anesthesiologist and Surgeon  Anesthesia Plan Comments: (  )       Anesthesia Quick Evaluation

## 2017-10-23 ENCOUNTER — Inpatient Hospital Stay (HOSPITAL_COMMUNITY)
Admission: RE | Admit: 2017-10-23 | Discharge: 2017-10-25 | DRG: 621 | Disposition: A | Discharge status: Home / Self Care | Payer: BC Managed Care – PPO | Source: Ambulatory Visit | Attending: General Surgery | Admitting: General Surgery

## 2017-10-23 ENCOUNTER — Inpatient Hospital Stay (HOSPITAL_COMMUNITY): Payer: BC Managed Care – PPO | Admitting: Anesthesiology

## 2017-10-23 ENCOUNTER — Other Ambulatory Visit: Payer: Self-pay

## 2017-10-23 ENCOUNTER — Encounter (HOSPITAL_COMMUNITY): Payer: Self-pay | Admitting: Emergency Medicine

## 2017-10-23 ENCOUNTER — Encounter (HOSPITAL_COMMUNITY)
Admission: RE | Disposition: A | Discharge status: Home / Self Care | Payer: Self-pay | Source: Ambulatory Visit | Attending: General Surgery

## 2017-10-23 DIAGNOSIS — Z79899 Other long term (current) drug therapy: Secondary | ICD-10-CM | POA: Diagnosis not present

## 2017-10-23 DIAGNOSIS — E8881 Metabolic syndrome: Secondary | ICD-10-CM | POA: Diagnosis present

## 2017-10-23 DIAGNOSIS — Z6841 Body Mass Index (BMI) 40.0 and over, adult: Secondary | ICD-10-CM

## 2017-10-23 DIAGNOSIS — Z87891 Personal history of nicotine dependence: Secondary | ICD-10-CM | POA: Diagnosis not present

## 2017-10-23 DIAGNOSIS — F329 Major depressive disorder, single episode, unspecified: Secondary | ICD-10-CM | POA: Diagnosis present

## 2017-10-23 DIAGNOSIS — E039 Hypothyroidism, unspecified: Secondary | ICD-10-CM | POA: Diagnosis present

## 2017-10-23 DIAGNOSIS — K219 Gastro-esophageal reflux disease without esophagitis: Secondary | ICD-10-CM | POA: Diagnosis present

## 2017-10-23 DIAGNOSIS — D649 Anemia, unspecified: Secondary | ICD-10-CM | POA: Diagnosis present

## 2017-10-23 DIAGNOSIS — Z9989 Dependence on other enabling machines and devices: Secondary | ICD-10-CM

## 2017-10-23 DIAGNOSIS — F419 Anxiety disorder, unspecified: Secondary | ICD-10-CM | POA: Diagnosis present

## 2017-10-23 DIAGNOSIS — G4733 Obstructive sleep apnea (adult) (pediatric): Secondary | ICD-10-CM | POA: Diagnosis present

## 2017-10-23 DIAGNOSIS — R110 Nausea: Secondary | ICD-10-CM | POA: Diagnosis not present

## 2017-10-23 DIAGNOSIS — I10 Essential (primary) hypertension: Secondary | ICD-10-CM | POA: Diagnosis present

## 2017-10-23 DIAGNOSIS — Z8659 Personal history of other mental and behavioral disorders: Secondary | ICD-10-CM

## 2017-10-23 HISTORY — PX: GASTRIC ROUX-EN-Y: SHX5262

## 2017-10-23 LAB — HEMOGLOBIN AND HEMATOCRIT, BLOOD
HCT: 41.5 % (ref 36.0–46.0)
Hemoglobin: 14.2 g/dL (ref 12.0–15.0)

## 2017-10-23 LAB — TYPE AND SCREEN
ABO/RH(D): O POS
Antibody Screen: NEGATIVE

## 2017-10-23 LAB — PREGNANCY, URINE: Preg Test, Ur: NEGATIVE

## 2017-10-23 SURGERY — LAPAROSCOPIC ROUX-EN-Y GASTRIC BYPASS WITH UPPER ENDOSCOPY
Anesthesia: General | Site: Abdomen

## 2017-10-23 MED ORDER — ACETAMINOPHEN 325 MG PO TABS: 325.0000 mg | ORAL_TABLET | ORAL | Status: DC | PRN

## 2017-10-23 MED ORDER — APREPITANT 40 MG PO CAPS
40.0000 mg | ORAL_CAPSULE | ORAL | Status: AC
  Administered 2017-10-23: 40 mg via ORAL
  Filled 2017-10-23: qty 1

## 2017-10-23 MED ORDER — BUPIVACAINE-EPINEPHRINE (PF) 0.25% -1:200000 IJ SOLN
INTRAMUSCULAR | Status: AC
  Filled 2017-10-23: qty 60

## 2017-10-23 MED ORDER — HYDROMORPHONE HCL 1 MG/ML IJ SOLN: 0.2500 mg | INTRAMUSCULAR | Status: DC | PRN

## 2017-10-23 MED ORDER — 0.9 % SODIUM CHLORIDE (POUR BTL) OPTIME
TOPICAL | Status: DC | PRN
  Administered 2017-10-23: 1000 mL

## 2017-10-23 MED ORDER — SODIUM CHLORIDE 0.9 % IJ SOLN
INTRAMUSCULAR | Status: DC | PRN
  Administered 2017-10-23: 50 mL

## 2017-10-23 MED ORDER — ACETAMINOPHEN 160 MG/5ML PO SOLN
650.0000 mg | Freq: Four times a day (QID) | ORAL | Status: DC
  Administered 2017-10-23: 650 mg via ORAL
  Filled 2017-10-23 (×3): qty 20.3

## 2017-10-23 MED ORDER — PROPOFOL 10 MG/ML IV BOLUS
INTRAVENOUS | Status: DC | PRN
  Administered 2017-10-23: 200 mg via INTRAVENOUS

## 2017-10-23 MED ORDER — ENOXAPARIN SODIUM 30 MG/0.3ML ~~LOC~~ SOLN
30.0000 mg | Freq: Two times a day (BID) | SUBCUTANEOUS | Status: DC
  Administered 2017-10-23 – 2017-10-25 (×4): 30 mg via SUBCUTANEOUS
  Filled 2017-10-23 (×4): qty 0.3

## 2017-10-23 MED ORDER — KETAMINE HCL 10 MG/ML IJ SOLN
INTRAMUSCULAR | Status: AC
  Filled 2017-10-23: qty 1

## 2017-10-23 MED ORDER — STERILE WATER FOR IRRIGATION IR SOLN
Status: DC | PRN
  Administered 2017-10-23: 1000 mL

## 2017-10-23 MED ORDER — OXYCODONE HCL 5 MG/5ML PO SOLN
5.0000 mg | Freq: Once | ORAL | Status: DC | PRN
  Filled 2017-10-23: qty 5

## 2017-10-23 MED ORDER — KETAMINE HCL 10 MG/ML IJ SOLN
INTRAMUSCULAR | Status: DC | PRN
  Administered 2017-10-23: 30 mg via INTRAVENOUS

## 2017-10-23 MED ORDER — ONDANSETRON HCL 4 MG/2ML IJ SOLN
INTRAMUSCULAR | Status: AC
  Administered 2017-10-23: 4 mg via INTRAVENOUS
  Filled 2017-10-23: qty 2

## 2017-10-23 MED ORDER — CHLORHEXIDINE GLUCONATE CLOTH 2 % EX PADS: 6.0000 | MEDICATED_PAD | Freq: Once | CUTANEOUS | Status: DC

## 2017-10-23 MED ORDER — OXYCODONE HCL 5 MG PO TABS: 5.0000 mg | ORAL_TABLET | Freq: Once | ORAL | Status: DC | PRN

## 2017-10-23 MED ORDER — PROPOFOL 10 MG/ML IV BOLUS
INTRAVENOUS | Status: AC
  Filled 2017-10-23: qty 20

## 2017-10-23 MED ORDER — DEXAMETHASONE SODIUM PHOSPHATE 10 MG/ML IJ SOLN
INTRAMUSCULAR | Status: DC | PRN
  Administered 2017-10-23: 10 mg via INTRAVENOUS

## 2017-10-23 MED ORDER — LIDOCAINE 2% (20 MG/ML) 5 ML SYRINGE
INTRAMUSCULAR | Status: AC
  Filled 2017-10-23: qty 5

## 2017-10-23 MED ORDER — SUCCINYLCHOLINE CHLORIDE 200 MG/10ML IV SOSY
PREFILLED_SYRINGE | INTRAVENOUS | Status: AC
  Filled 2017-10-23: qty 10

## 2017-10-23 MED ORDER — ACETAMINOPHEN 160 MG/5ML PO SOLN: 325.0000 mg | ORAL | Status: DC | PRN

## 2017-10-23 MED ORDER — FENTANYL CITRATE (PF) 100 MCG/2ML IJ SOLN
INTRAMUSCULAR | Status: AC
  Administered 2017-10-23: 25 ug via INTRAVENOUS
  Filled 2017-10-23: qty 2

## 2017-10-23 MED ORDER — ROCURONIUM BROMIDE 10 MG/ML (PF) SYRINGE
PREFILLED_SYRINGE | INTRAVENOUS | Status: AC
  Filled 2017-10-23: qty 5

## 2017-10-23 MED ORDER — LIDOCAINE HCL 2 % IJ SOLN
INTRAMUSCULAR | Status: AC
  Filled 2017-10-23: qty 20

## 2017-10-23 MED ORDER — OXYCODONE HCL 5 MG/5ML PO SOLN
5.0000 mg | ORAL | Status: DC | PRN
  Administered 2017-10-23 – 2017-10-25 (×5): 5 mg via ORAL
  Filled 2017-10-23 (×3): qty 5
  Filled 2017-10-23: qty 10
  Filled 2017-10-23 (×2): qty 5

## 2017-10-23 MED ORDER — ONDANSETRON HCL 4 MG/2ML IJ SOLN
INTRAMUSCULAR | Status: AC
  Filled 2017-10-23: qty 2

## 2017-10-23 MED ORDER — DEXAMETHASONE SODIUM PHOSPHATE 10 MG/ML IJ SOLN
INTRAMUSCULAR | Status: AC
  Filled 2017-10-23: qty 1

## 2017-10-23 MED ORDER — MORPHINE SULFATE (PF) 2 MG/ML IV SOLN
1.0000 mg | INTRAVENOUS | Status: DC | PRN
  Administered 2017-10-23 (×2): 2 mg via INTRAVENOUS
  Filled 2017-10-23 (×2): qty 1

## 2017-10-23 MED ORDER — FAMOTIDINE IN NACL 20-0.9 MG/50ML-% IV SOLN
20.0000 mg | Freq: Two times a day (BID) | INTRAVENOUS | Status: DC
  Administered 2017-10-23 – 2017-10-25 (×5): 20 mg via INTRAVENOUS
  Filled 2017-10-23 (×5): qty 50

## 2017-10-23 MED ORDER — SUGAMMADEX SODIUM 500 MG/5ML IV SOLN
INTRAVENOUS | Status: AC
  Filled 2017-10-23: qty 5

## 2017-10-23 MED ORDER — MEPERIDINE HCL 50 MG/ML IJ SOLN: 6.2500 mg | INTRAMUSCULAR | Status: DC | PRN

## 2017-10-23 MED ORDER — LISINOPRIL-HYDROCHLOROTHIAZIDE 20-12.5 MG PO TABS: 1.0000 | ORAL_TABLET | Freq: Every day | ORAL | Status: DC

## 2017-10-23 MED ORDER — GABAPENTIN 300 MG PO CAPS
300.0000 mg | ORAL_CAPSULE | ORAL | Status: AC
  Administered 2017-10-23: 300 mg via ORAL
  Filled 2017-10-23: qty 1

## 2017-10-23 MED ORDER — BUPIVACAINE-EPINEPHRINE 0.25% -1:200000 IJ SOLN
INTRAMUSCULAR | Status: DC | PRN
  Administered 2017-10-23: 11 mL

## 2017-10-23 MED ORDER — ACETAMINOPHEN 500 MG PO TABS
1000.0000 mg | ORAL_TABLET | ORAL | Status: AC
  Administered 2017-10-23: 1000 mg via ORAL
  Filled 2017-10-23: qty 2

## 2017-10-23 MED ORDER — PROMETHAZINE HCL 25 MG/ML IJ SOLN
INTRAMUSCULAR | Status: AC
  Filled 2017-10-23: qty 1

## 2017-10-23 MED ORDER — HEPARIN SODIUM (PORCINE) 5000 UNIT/ML IJ SOLN
5000.0000 [IU] | INTRAMUSCULAR | Status: AC
  Administered 2017-10-23: 5000 [IU] via SUBCUTANEOUS
  Filled 2017-10-23: qty 1

## 2017-10-23 MED ORDER — LISINOPRIL 20 MG PO TABS
20.0000 mg | ORAL_TABLET | Freq: Every day | ORAL | Status: DC
  Administered 2017-10-23 – 2017-10-25 (×3): 20 mg via ORAL
  Filled 2017-10-23 (×3): qty 1

## 2017-10-23 MED ORDER — POTASSIUM CHLORIDE IN NACL 20-0.9 MEQ/L-% IV SOLN
INTRAVENOUS | Status: DC
  Administered 2017-10-23 – 2017-10-24 (×3): via INTRAVENOUS
  Filled 2017-10-23 (×4): qty 1000

## 2017-10-23 MED ORDER — SODIUM CHLORIDE 0.9 % IV SOLN
2.0000 g | INTRAVENOUS | Status: AC
  Administered 2017-10-23: 2 g via INTRAVENOUS
  Filled 2017-10-23: qty 2

## 2017-10-23 MED ORDER — LACTATED RINGERS IR SOLN
Status: DC | PRN
  Administered 2017-10-23: 1000 mL

## 2017-10-23 MED ORDER — LIDOCAINE 20MG/ML (2%) 15 ML SYRINGE OPTIME
INTRAMUSCULAR | Status: DC | PRN
  Administered 2017-10-23: 1.5 mg/kg/h via INTRAVENOUS

## 2017-10-23 MED ORDER — PROMETHAZINE HCL 25 MG/ML IJ SOLN
12.5000 mg | INTRAMUSCULAR | Status: DC | PRN
  Administered 2017-10-23: 12.5 mg via INTRAVENOUS
  Filled 2017-10-23: qty 1

## 2017-10-23 MED ORDER — ONDANSETRON HCL 4 MG/2ML IJ SOLN
INTRAMUSCULAR | Status: DC | PRN
  Administered 2017-10-23: 4 mg via INTRAVENOUS

## 2017-10-23 MED ORDER — PREMIER PROTEIN SHAKE
2.0000 [oz_av] | ORAL | Status: DC
  Administered 2017-10-24 – 2017-10-25 (×6): 2 [oz_av] via ORAL

## 2017-10-23 MED ORDER — PROMETHAZINE HCL 25 MG/ML IJ SOLN: 6.2500 mg | INTRAMUSCULAR | Status: DC | PRN

## 2017-10-23 MED ORDER — MIDAZOLAM HCL 5 MG/5ML IJ SOLN
INTRAMUSCULAR | Status: DC | PRN
  Administered 2017-10-23: 2 mg via INTRAVENOUS

## 2017-10-23 MED ORDER — DEXAMETHASONE SODIUM PHOSPHATE 4 MG/ML IJ SOLN: 4.0000 mg | INTRAMUSCULAR | Status: DC

## 2017-10-23 MED ORDER — SODIUM CHLORIDE 0.9 % IJ SOLN
INTRAMUSCULAR | Status: AC
  Filled 2017-10-23: qty 50

## 2017-10-23 MED ORDER — FENTANYL CITRATE (PF) 100 MCG/2ML IJ SOLN
INTRAMUSCULAR | Status: DC | PRN
  Administered 2017-10-23: 100 ug via INTRAVENOUS
  Administered 2017-10-23: 50 ug via INTRAVENOUS

## 2017-10-23 MED ORDER — EVICEL 5 ML EX KIT
PACK | Freq: Once | CUTANEOUS | Status: AC
  Administered 2017-10-23: 1
  Filled 2017-10-23: qty 1

## 2017-10-23 MED ORDER — PROPOFOL 500 MG/50ML IV EMUL
INTRAVENOUS | Status: DC | PRN
  Administered 2017-10-23: 25 ug/kg/min via INTRAVENOUS

## 2017-10-23 MED ORDER — SUGAMMADEX SODIUM 500 MG/5ML IV SOLN
INTRAVENOUS | Status: DC | PRN
  Administered 2017-10-23: 300 mg via INTRAVENOUS

## 2017-10-23 MED ORDER — BUPIVACAINE LIPOSOME 1.3 % IJ SUSP
INTRAMUSCULAR | Status: DC | PRN
  Administered 2017-10-23: 20 mL

## 2017-10-23 MED ORDER — ROCURONIUM BROMIDE 10 MG/ML (PF) SYRINGE
PREFILLED_SYRINGE | INTRAVENOUS | Status: DC | PRN
  Administered 2017-10-23: 20 mg via INTRAVENOUS
  Administered 2017-10-23: 60 mg via INTRAVENOUS
  Administered 2017-10-23 (×2): 20 mg via INTRAVENOUS

## 2017-10-23 MED ORDER — SCOPOLAMINE 1 MG/3DAYS TD PT72
1.0000 | MEDICATED_PATCH | TRANSDERMAL | Status: DC
  Administered 2017-10-23: 1.5 mg via TRANSDERMAL
  Filled 2017-10-23: qty 1

## 2017-10-23 MED ORDER — LIDOCAINE HCL (CARDIAC) PF 100 MG/5ML IV SOSY
PREFILLED_SYRINGE | INTRAVENOUS | Status: DC | PRN
  Administered 2017-10-23: 100 mg via INTRAVENOUS

## 2017-10-23 MED ORDER — HYDROMORPHONE HCL 1 MG/ML IJ SOLN
INTRAMUSCULAR | Status: AC
  Filled 2017-10-23: qty 1

## 2017-10-23 MED ORDER — MIDAZOLAM HCL 2 MG/2ML IJ SOLN
INTRAMUSCULAR | Status: AC
  Filled 2017-10-23: qty 2

## 2017-10-23 MED ORDER — CELECOXIB 200 MG PO CAPS
400.0000 mg | ORAL_CAPSULE | ORAL | Status: AC
  Administered 2017-10-23: 400 mg via ORAL
  Filled 2017-10-23: qty 2

## 2017-10-23 MED ORDER — ONDANSETRON HCL 4 MG/2ML IJ SOLN
4.0000 mg | Freq: Once | INTRAMUSCULAR | Status: AC | PRN
  Administered 2017-10-23: 4 mg via INTRAVENOUS

## 2017-10-23 MED ORDER — FENTANYL CITRATE (PF) 100 MCG/2ML IJ SOLN
25.0000 ug | INTRAMUSCULAR | Status: DC | PRN
  Administered 2017-10-23: 50 ug via INTRAVENOUS
  Administered 2017-10-23 (×2): 25 ug via INTRAVENOUS

## 2017-10-23 MED ORDER — FENTANYL CITRATE (PF) 250 MCG/5ML IJ SOLN
INTRAMUSCULAR | Status: AC
  Filled 2017-10-23: qty 5

## 2017-10-23 MED ORDER — ONDANSETRON HCL 4 MG/2ML IJ SOLN
4.0000 mg | INTRAMUSCULAR | Status: DC | PRN
  Administered 2017-10-23 (×2): 4 mg via INTRAVENOUS
  Filled 2017-10-23 (×3): qty 2

## 2017-10-23 MED ORDER — HYDROCHLOROTHIAZIDE 12.5 MG PO CAPS
12.5000 mg | ORAL_CAPSULE | Freq: Every day | ORAL | Status: DC
  Administered 2017-10-23 – 2017-10-25 (×3): 12.5 mg via ORAL
  Filled 2017-10-23 (×3): qty 1

## 2017-10-23 MED ORDER — SODIUM CHLORIDE 0.9 % IJ SOLN
INTRAMUSCULAR | Status: AC
  Filled 2017-10-23: qty 10

## 2017-10-23 MED ORDER — ALBUTEROL SULFATE (2.5 MG/3ML) 0.083% IN NEBU: 3.0000 mL | INHALATION_SOLUTION | RESPIRATORY_TRACT | Status: DC | PRN

## 2017-10-23 MED ORDER — LEVOTHYROXINE SODIUM 100 MCG IV SOLR
50.0000 ug | Freq: Every day | INTRAVENOUS | Status: DC
  Administered 2017-10-24 – 2017-10-25 (×2): 50 ug via INTRAVENOUS
  Filled 2017-10-23 (×3): qty 5

## 2017-10-23 MED ORDER — LACTATED RINGERS IV SOLN
INTRAVENOUS | Status: DC | PRN
  Administered 2017-10-23 (×2): via INTRAVENOUS

## 2017-10-23 MED ORDER — PROPOFOL 10 MG/ML IV BOLUS
INTRAVENOUS | Status: AC
Start: 2017-10-23 — End: ?
  Filled 2017-10-23: qty 20

## 2017-10-23 MED ORDER — ESCITALOPRAM OXALATE 20 MG PO TABS
20.0000 mg | ORAL_TABLET | Freq: Every day | ORAL | Status: DC
Start: 2017-10-24 — End: 2017-10-25
  Administered 2017-10-24 – 2017-10-25 (×2): 20 mg via ORAL
  Filled 2017-10-23 (×2): qty 1

## 2017-10-23 SURGICAL SUPPLY — 81 items
APPLIER CLIP ROT 10 11.4 M/L (STAPLE)
APPLIER CLIP ROT 13.4 12 LRG (CLIP)
BLADE SURG SZ11 CARB STEEL (BLADE) ×3 IMPLANT
CABLE HIGH FREQUENCY MONO STRZ (ELECTRODE) ×3 IMPLANT
CHLORAPREP W/TINT 26ML (MISCELLANEOUS) ×3 IMPLANT
CLIP APPLIE ROT 10 11.4 M/L (STAPLE) IMPLANT
CLIP APPLIE ROT 13.4 12 LRG (CLIP) IMPLANT
CLIP SUT LAPRA TY ABSORB (SUTURE) ×9 IMPLANT
CUTTER FLEX LINEAR 45M (STAPLE) ×3 IMPLANT
DECANTER SPIKE VIAL GLASS SM (MISCELLANEOUS) ×3 IMPLANT
DERMABOND ADVANCED (GAUZE/BANDAGES/DRESSINGS) ×2
DERMABOND ADVANCED .7 DNX12 (GAUZE/BANDAGES/DRESSINGS) ×1 IMPLANT
DEVICE SUT QUICK LOAD TK 5 (STAPLE) IMPLANT
DEVICE SUT TI-KNOT TK 5X26 (MISCELLANEOUS) IMPLANT
DEVICE SUTURE ENDOST 10MM (ENDOMECHANICALS) ×3 IMPLANT
DEVICE TI KNOT TK5 (MISCELLANEOUS)
DISSECTOR BLUNT TIP ENDO 5MM (MISCELLANEOUS) IMPLANT
DRAIN PENROSE 18X1/4 LTX STRL (WOUND CARE) ×3 IMPLANT
ELECT REM PT RETURN 15FT ADLT (MISCELLANEOUS) ×3 IMPLANT
GAUZE 4X4 16PLY RFD (DISPOSABLE) ×3 IMPLANT
GAUZE SPONGE 4X4 12PLY STRL (GAUZE/BANDAGES/DRESSINGS) ×3 IMPLANT
GLOVE BIO SURGEON STRL SZ8 (GLOVE) ×3 IMPLANT
GLOVE BIOGEL PI IND STRL 6.5 (GLOVE) ×1 IMPLANT
GLOVE BIOGEL PI IND STRL 7.0 (GLOVE) ×2 IMPLANT
GLOVE BIOGEL PI IND STRL 7.5 (GLOVE) ×2 IMPLANT
GLOVE BIOGEL PI INDICATOR 6.5 (GLOVE) ×2
GLOVE BIOGEL PI INDICATOR 7.0 (GLOVE) ×4
GLOVE BIOGEL PI INDICATOR 7.5 (GLOVE) ×4
GLOVE ECLIPSE 7.0 STRL STRAW (GLOVE) ×3 IMPLANT
GLOVE ECLIPSE 7.5 STRL STRAW (GLOVE) ×3 IMPLANT
GOWN STRL REUS W/ TWL LRG LVL3 (GOWN DISPOSABLE) ×2 IMPLANT
GOWN STRL REUS W/TWL LRG LVL3 (GOWN DISPOSABLE) ×4
GOWN STRL REUS W/TWL XL LVL3 (GOWN DISPOSABLE) ×6 IMPLANT
HEMOSTAT SURGICEL 4X8 (HEMOSTASIS) IMPLANT
HOVERMATT SINGLE USE (MISCELLANEOUS) ×3 IMPLANT
KIT BASIN OR (CUSTOM PROCEDURE TRAY) ×3 IMPLANT
KIT GASTRIC LAVAGE 34FR ADT (SET/KITS/TRAYS/PACK) ×3 IMPLANT
MARKER SKIN DUAL TIP RULER LAB (MISCELLANEOUS) ×3 IMPLANT
NEEDLE SPNL 22GX3.5 QUINCKE BK (NEEDLE) ×3 IMPLANT
PACK CARDIOVASCULAR III (CUSTOM PROCEDURE TRAY) ×3 IMPLANT
POUCH SPECIMEN RETRIEVAL 10MM (ENDOMECHANICALS) IMPLANT
QUICK LOAD TK 5 (STAPLE)
RELOAD 45 VASCULAR/THIN (ENDOMECHANICALS) ×3 IMPLANT
RELOAD ENDO STITCH 2.0 (ENDOMECHANICALS) ×20
RELOAD STAPLE TA45 3.5 REG BLU (ENDOMECHANICALS) ×3 IMPLANT
RELOAD STAPLER BLUE 60MM (STAPLE) ×3 IMPLANT
RELOAD STAPLER GOLD 60MM (STAPLE) ×1 IMPLANT
RELOAD STAPLER WHITE 60MM (STAPLE) ×1 IMPLANT
SCISSORS LAP 5X45 EPIX DISP (ENDOMECHANICALS) ×3 IMPLANT
SET IRRIG TUBING LAPAROSCOPIC (IRRIGATION / IRRIGATOR) ×3 IMPLANT
SHEARS HARMONIC ACE PLUS 45CM (MISCELLANEOUS) ×3 IMPLANT
SLEEVE ADV FIXATION 12X100MM (TROCAR) ×6 IMPLANT
SOLUTION ANTI FOG 6CC (MISCELLANEOUS) ×3 IMPLANT
STAPLER ECHELON LONG 60 440 (INSTRUMENTS) ×3 IMPLANT
STAPLER RELOAD BLUE 60MM (STAPLE) ×9
STAPLER RELOAD GOLD 60MM (STAPLE) ×3
STAPLER RELOAD WHITE 60MM (STAPLE) ×3
SUT MNCRL AB 4-0 PS2 18 (SUTURE) ×3 IMPLANT
SUT RELOAD ENDO STITCH 2 48X1 (ENDOMECHANICALS) ×5
SUT RELOAD ENDO STITCH 2.0 (ENDOMECHANICALS) ×5
SUT SURGIDAC NAB ES-9 0 48 120 (SUTURE) IMPLANT
SUT VIC AB 2-0 SH 27 (SUTURE) ×2
SUT VIC AB 2-0 SH 27X BRD (SUTURE) ×1 IMPLANT
SUTURE RELOAD END STTCH 2 48X1 (ENDOMECHANICALS) ×5 IMPLANT
SUTURE RELOAD ENDO STITCH 2.0 (ENDOMECHANICALS) ×5 IMPLANT
SYR 10ML ECCENTRIC (SYRINGE) ×3 IMPLANT
SYR 20CC LL (SYRINGE) ×6 IMPLANT
SYR 50ML LL SCALE MARK (SYRINGE) ×3 IMPLANT
TIP RIGID 35CM EVICEL (HEMOSTASIS) ×3 IMPLANT
TOWEL OR 17X26 10 PK STRL BLUE (TOWEL DISPOSABLE) ×3 IMPLANT
TOWEL OR NON WOVEN STRL DISP B (DISPOSABLE) ×3 IMPLANT
TRAY FOLEY W/BAG SLVR 14FR (SET/KITS/TRAYS/PACK) ×3 IMPLANT
TROCAR ADV FIXATION 12X100MM (TROCAR) ×3 IMPLANT
TROCAR ADV FIXATION 5X100MM (TROCAR) ×3 IMPLANT
TROCAR BLADELESS OPT 5 100 (ENDOMECHANICALS) ×3 IMPLANT
TROCAR XCEL 12X100 BLDLESS (ENDOMECHANICALS) ×3 IMPLANT
TUBE CALIBRATION LAPBAND (TUBING) IMPLANT
TUBING CONNECTING 10 (TUBING) ×4 IMPLANT
TUBING CONNECTING 10' (TUBING) ×2
TUBING ENDO SMARTCAP PENTAX (MISCELLANEOUS) ×3 IMPLANT
TUBING INSUF HEATED (TUBING) ×3 IMPLANT

## 2017-10-23 NOTE — Anesthesia Procedure Notes (Signed)
Procedure Name: Intubation Date/Time: 10/23/2017 7:28 AM Performed by: Glory Buff, CRNA Pre-anesthesia Checklist: Patient identified, Emergency Drugs available, Suction available and Patient being monitored Patient Re-evaluated:Patient Re-evaluated prior to induction Oxygen Delivery Method: Circle system utilized Preoxygenation: Pre-oxygenation with 100% oxygen Induction Type: IV induction Ventilation: Mask ventilation without difficulty Laryngoscope Size: Miller and 3 Grade View: Grade I Tube type: Oral Tube size: 7.0 mm Number of attempts: 1 Airway Equipment and Method: Stylet and Oral airway Placement Confirmation: ETT inserted through vocal cords under direct vision,  positive ETCO2 and breath sounds checked- equal and bilateral Secured at: 20 cm Tube secured with: Tape Dental Injury: Teeth and Oropharynx as per pre-operative assessment

## 2017-10-23 NOTE — Progress Notes (Signed)
Discussed post op day goals with patient including ambulation, IS, diet progression, pain, and nausea control.  Questions answered. 

## 2017-10-23 NOTE — Interval H&P Note (Signed)
History and Physical Interval Note:  10/23/2017 7:15 AM  Heather Hardin  has presented today for surgery, with the diagnosis of Morbid Obesity, OSA, HTN, GERD, Metabolic Syndrome  The various methods of treatment have been discussed with the patient and family. After consideration of risks, benefits and other options for treatment, the patient has consented to  Procedure(s): LAPAROSCOPIC ROUX-EN-Y GASTRIC BYPASS WITH UPPER ENDOSCOPY, ERAS Pathway (N/A) as a surgical intervention .  The patient's history has been reviewed, patient examined, no change in status, stable for surgery.  I have reviewed the patient's chart and labs.  Questions were answered to the patient's satisfaction.     Darene Lamer Lovette Merta

## 2017-10-23 NOTE — Transfer of Care (Signed)
Immediate Anesthesia Transfer of Care Note  Patient: Heather Hardin  Procedure(s) Performed: LAPAROSCOPIC ROUX-EN-Y GASTRIC BYPASS WITH UPPER ENDOSCOPY, ERAS Pathway (N/A Abdomen)  Patient Location: PACU  Anesthesia Type:General  Level of Consciousness: awake, alert  and oriented  Airway & Oxygen Therapy: Patient Spontanous Breathing and Patient connected to face mask oxygen  Post-op Assessment: Report given to RN and Post -op Vital signs reviewed and stable  Post vital signs: Reviewed and stable  Last Vitals:  Vitals Value Taken Time  BP 144/84 10/23/2017 10:23 AM  Temp    Pulse 77 10/23/2017 10:25 AM  Resp 13 10/23/2017 10:25 AM  SpO2 100 % 10/23/2017 10:25 AM  Vitals shown include unvalidated device data.  Last Pain:  Vitals:   10/23/17 0621  TempSrc: Oral  PainSc:       Patients Stated Pain Goal: 4 (28/11/88 6773)  Complications: No apparent anesthesia complications

## 2017-10-23 NOTE — Op Note (Signed)
Heather Hardin [id number] 16-Jan-1976 10/23/2017  Preoperative diagnosis: morbid obesity  Postoperative diagnosis: Same   Procedure: Upper endoscopy   Surgeon: Gayland Curry M.D., FACS   Anesthesia: Gen.   Indications for procedure: 42 y.o. yo female undergoing a laparoscopic roux en y gastric bypass and an upper endoscopy was requested to evaluate the anastomosis.  Description of procedure: After we have completed the new gastrojejunostomy, I scrubbed out and obtained the Olympus endoscope. I gently placed endoscope in the patient's oropharynx and gently glided it down the esophagus without any difficulty under direct visualization. Once I was in the gastric pouch, I insufflated the pouch was air. The pouch was approximately 4.5 cm in size. I was able to cannulate and advanced the scope through the gastrojejunostomy. Dr. Excell Seltzer had placed saline in the upper abdomen. Upon further insufflation of the gastric pouch there was no evidence of bubbles. Upon further inspection of the gastric pouch, the mucosa appeared normal. There is no evidence of any mucosal abnormality. The gastric pouch and Roux limb were decompressed. The width of the gastrojejunal anastomosis was at least 2 cm. The scope was withdrawn. The patient tolerated this portion of the procedure well. Please see Dr Lear Ng operative note for details regarding the laparoscopic roux-en-y gastric bypass.  [name]. Redmond Pulling, MD, FACS General, Bariatric, & Minimally Invasive Surgery [facility] Surgery, [place]

## 2017-10-23 NOTE — Anesthesia Postprocedure Evaluation (Signed)
Anesthesia Post Note  Patient: Heather Hardin  Procedure(s) Performed: LAPAROSCOPIC ROUX-EN-Y GASTRIC BYPASS WITH UPPER ENDOSCOPY, ERAS Pathway (N/A Abdomen)     Patient location during evaluation: PACU Anesthesia Type: General Level of consciousness: awake and alert Pain management: pain level controlled Vital Signs Assessment: post-procedure vital signs reviewed and stable Respiratory status: spontaneous breathing, nonlabored ventilation, respiratory function stable and patient connected to nasal cannula oxygen Cardiovascular status: blood pressure returned to baseline and stable Postop Assessment: no apparent nausea or vomiting Anesthetic complications: no    Last Vitals:  Vitals:   10/23/17 1030 10/23/17 1045  BP: (!) 143/90   Pulse: 77 78  Resp: 17 16  Temp:    SpO2: 100% 94%    Last Pain:  Vitals:   10/23/17 0621  TempSrc: Oral  PainSc:                  Tyrome Donatelli

## 2017-10-23 NOTE — Op Note (Signed)
Preop diagnosis: Morbid obesity  Postop diagnosis: Morbid obesity  Body mass index is 46.2 kg/m.  Surgical procedure: Laparoscopic Roux-en-Y gastric bypass  Surgeon: Marland Kitchen T.Emya Picado M.D.  Asst.: Greer Pickerel M.D.  Anesthesia: General  Complications:  None  EBL: Minimal  Drains: None  Disposition: PACU in good condition  Description of procedure: Patient is brought to the operating room and general anesthesia induced. She had received preoperative broad-spectrum IV antibiotics and subcutaneous heparin. The abdomen was widely sterilely prepped and draped. Patient timeout was performed and correct patient and procedure confirmed. Access was obtained with a 12 mm Optiview trocar in the left upper quadrant and pneumoperitoneum established without difficulty. Under direct vision 12 mm trocars were placed laterally in the right upper quadrant, right upper quadrant midclavicular line, and to the left and above the umbilicus for the camera port. A 5 mm trocar was placed laterally in the left upper quadrant. Bilateral TAP block was performed with Exparel. The omentum was brought into the upper abdomen and the transverse mesocolon elevated and the ligament of Treitz clearly identified. A 40 cm biliopancreatic limb was then carefully measured from the ligament of Treitz. The small intestine was divided at this point with a single firing of the white load linear stapler. A Penrose drain was sutured to the end of the Roux-en-Y limb for later identification. A 100 cm Roux-en-Y limb was then carefully measured. At this point a side-to-side anastomosis was created between the Roux limb and the end of the biliopancreatic limb. This was accomplished with a single firing of the 45 mm white load linear stapler. The common enterotomy was closed with a running 2-0 Vicryl begun at either end of the enterotomy and tied centrally. The mesenteric defect was then closed with running 2-0 silk. The omentum was then  divided with the harmonic scalpel up towards the transverse colon to allow mobility of the Roux limb toward the gastric pouch. The patient was then placed in steep reversed Trendelenburg. Through a 5 mm subxiphoid site the [facility] retractor was placed and the left lobe of the liver elevated with excellent exposure of the upper stomach and hiatus. The angle of Hiss was then mobilized with the harmonic scalpel. A 4 cm gastric pouch was then carefully measured along the lesser curve of the stomach. Dissection was carried along the lesser curve at this point with the Harmonic scalpel working carefully back toward the lesser sac at right angles to the lesser curve. The free lesser sac was then entered. After being sure all tubes were removed from the stomach an initial firing of the gold load 60 mm linear stapler was fired at right angles across the lesser curve for about 4 cm. The gastric pouch was further mobilized posteriorly and then the pouch was completed with 2 further firings of the 60 mm blue load linear stapler up through the previously dissected angle of His. It was ensured that the pouch was completely mobilized away from the gastric remnant. This created a nice tubular 4-5 cm gastric pouch. The staple line of the gastric remnant was then oversewn with 2-0 silk for hemostasis. The Roux limb was then brought up in an antecolic fashion with the candycane facing to the patient's left without undue tension. The gastrojejunostomy was created with an initial posterior row of 2-0 Vicryl between the Roux limb and the staple line of the gastric pouch. Enterotomies were then made in the gastric pouch and the Roux limb with the harmonic scalpel and at approximately 2-2-1/2  cm anastomosis was created with a single firing of the blue load linear stapler. The staple line was inspected and was intact without bleeding. The common enterotomy was then closed with running 2-0 Vicryl begun at either end and tied centrally. The  wall tube was then easily passed through the anastomosis and an outer anterior layer of running 2-0 Vicryl was placed. The Ewald tube was removed. With the outlet of the gastrojejunostomy clamped and under saline irrigation the assistant performed upper endoscopy and with the gastric pouch tensely distended with air there was no evidence of leak. The pouch was desufflated. The Terance Hart defect was closed with running 2-0 silk. The abdomen was inspected for any evidence of bleeding or bowel injury and everything looked fine. The Nathanson retractor was removed under direct vision after coating the anastomosis with Tisseel tissue sealant. All CO2 was evacuated and trochars removed. Skin incisions were closed with staples. Sponge needle and instrument counts were correct. The patient was taken to the PACU in good condition.     Edward Jolly MD, FACS  10/23/2017, 10:08 AM

## 2017-10-24 ENCOUNTER — Encounter (HOSPITAL_COMMUNITY): Payer: Self-pay | Admitting: General Surgery

## 2017-10-24 LAB — CBC WITH DIFFERENTIAL/PLATELET
Basophils Absolute: 0 10*3/uL (ref 0.0–0.1)
Basophils Relative: 0 %
Eosinophils Absolute: 0 10*3/uL (ref 0.0–0.7)
Eosinophils Relative: 0 %
HCT: 40.9 % (ref 36.0–46.0)
Hemoglobin: 13.7 g/dL (ref 12.0–15.0)
Lymphocytes Relative: 10 %
Lymphs Abs: 1 10*3/uL (ref 0.7–4.0)
MCH: 30.6 pg (ref 26.0–34.0)
MCHC: 33.5 g/dL (ref 30.0–36.0)
MCV: 91.3 fL (ref 78.0–100.0)
Monocytes Absolute: 0.6 10*3/uL (ref 0.1–1.0)
Monocytes Relative: 6 %
Neutro Abs: 8.7 10*3/uL — ABNORMAL HIGH (ref 1.7–7.7)
Neutrophils Relative %: 84 %
Platelets: 301 10*3/uL (ref 150–400)
RBC: 4.48 MIL/uL (ref 3.87–5.11)
RDW: 12.7 % (ref 11.5–15.5)
WBC: 10.3 10*3/uL (ref 4.0–10.5)

## 2017-10-24 MED ORDER — PROMETHAZINE HCL 25 MG/ML IJ SOLN: 12.5000 mg | INTRAMUSCULAR | Status: DC | PRN

## 2017-10-24 NOTE — Progress Notes (Signed)
Patient alert and oriented, Post op day 1.  Provided support and encouragement.  Encouraged pulmonary toilet, ambulation and small sips of liquids. Completed 12 ounces of bari clear fluid starting protein shake. All questions answered.  Will continue to monitor.

## 2017-10-24 NOTE — Plan of Care (Signed)

## 2017-10-24 NOTE — Progress Notes (Signed)
Patient ID: Heather Hardin, female   DOB: 1975/06/19, 42 y.o.   MRN: [id number] 1 Day Post-Op   Subjective: A lot of nausea yesterday that prevented much oral intake.  This is resolved this morning.  Tolerating water.  Denies pain.  Ambulatory.  Objective: Vital signs in last 24 hours: Temp:  [97.6 F (36.4 C)-98.6 F (37 C)] 98.1 F (36.7 C) (06/11 0511) Pulse Rate:  [67-83] 79 (06/11 0511) Resp:  [14-18] 18 (06/11 0511) BP: (118-167)/(74-101) 118/75 (06/11 0511) SpO2:  [94 %-100 %] 100 % (06/11 0511) Weight:  [133.6 kg (294 lb 8.6 oz)] 133.6 kg (294 lb 8.6 oz) (06/11 0511) Last BM Date: 10/23/17  Intake/Output from previous day: 06/10 0701 - 06/11 0700 In: 3663.3 [P.O.:360; I.V.:3103.3; IV Piggyback:200] Out: 4700 [MEQAS:3419; Blood:25] Intake/Output this shift: Total I/O In: 60 [P.O.:60] Out: -   General appearance: alert, cooperative and no distress GI: Mild appropriate epigastric tenderness Incision/Wound: No erythema or drainage  Lab Results:  Recent Labs    10/23/17 1442 10/24/17 0443  WBC  --  10.3  HGB 14.2 13.7  HCT 41.5 40.9  PLT  --  301   BMET No results for input(s): NA, K, CL, CO2, GLUCOSE, BUN, CREATININE, CALCIUM in the last 72 hours.   Studies/Results: No results found.  Anti-infectives: Anti-infectives (From admission, onward)   Start     Dose/Rate Route Frequency Ordered Stop   10/23/17 0600  cefoTEtan (CEFOTAN) 2 g in sodium chloride 0.9 % 100 mL IVPB     2 g 200 mL/hr over 30 Minutes Intravenous On call to O.R. 10/23/17 0546 10/23/17 0800      Assessment/Plan: s/p Procedure(s): LAPAROSCOPIC ROUX-EN-Y GASTRIC BYPASS WITH UPPER ENDOSCOPY, ERAS Pathway Doing well postoperatively without apparent complication.  Advancing to protein shakes this morning.  Likely plan discharge tomorrow though could go home later today if does very well with oral intake.   LOS: 1 day    Edward Jolly 10/24/2017

## 2017-10-24 NOTE — Progress Notes (Signed)
Patient felt earlier protein shake a little too thick and caused discomfort.  Patient is trying chicken soup unjury.  Discussed ordering bari clear tray and unlimited bari clear fluids with transition to protein.  Patient restated to [facility] to take small sips of fluid, ambulate 6x per day, and use IS every hour.

## 2017-10-25 LAB — CBC WITH DIFFERENTIAL/PLATELET
Basophils Absolute: 0 10*3/uL (ref 0.0–0.1)
Basophils Relative: 0 %
Eosinophils Absolute: 0 10*3/uL (ref 0.0–0.7)
Eosinophils Relative: 0 %
HCT: 38.5 % (ref 36.0–46.0)
Hemoglobin: 12.5 g/dL (ref 12.0–15.0)
Lymphocytes Relative: 26 %
Lymphs Abs: 1.8 10*3/uL (ref 0.7–4.0)
MCH: 29.9 pg (ref 26.0–34.0)
MCHC: 32.5 g/dL (ref 30.0–36.0)
MCV: 92.1 fL (ref 78.0–100.0)
Monocytes Absolute: 0.5 10*3/uL (ref 0.1–1.0)
Monocytes Relative: 7 %
Neutro Abs: 4.4 10*3/uL (ref 1.7–7.7)
Neutrophils Relative %: 67 %
Platelets: 245 10*3/uL (ref 150–400)
RBC: 4.18 MIL/uL (ref 3.87–5.11)
RDW: 12.7 % (ref 11.5–15.5)
WBC: 6.7 10*3/uL (ref 4.0–10.5)

## 2017-10-25 MED ORDER — OXYCODONE HCL 5 MG/5ML PO SOLN: 5.0000 mg | ORAL | 0 refills | Status: DC | PRN

## 2017-10-25 NOTE — Discharge Summary (Signed)
  Patient ID: Heather Hardin [id number] y.o. [birth year]  10/23/2017  Discharge date and time: 10/25/2017   Admitting Physician: Edward Jolly  Discharge Physician: Edward Jolly  Admission Diagnoses: Morbid Obesity, OSA, HTN, GERD, Metabolic Syndrome  Discharge Diagnoses: Same  Operations: Procedure(s): LAPAROSCOPIC ROUX-EN-Y GASTRIC BYPASS WITH UPPER ENDOSCOPY, ERAS Pathway  Admission Condition: good  Discharged Condition: good  Indication for Admission: Patient with progressive morbid obesity unresponsive to multiple efforts at medical management who presents with a BMI of 48 and comorbidities of obstructive sleep apnea, metabolic syndrome, hypertension and GERD.  After extensive preoperative work-up and discussion detailed elsewhere she is electively admitted for laparoscopic Roux-en-Y gastric bypass for treatment of her morbid obesity.    Hospital Course: On the morning of his admission the patient underwent an uneventful laparoscopic Roux-en-Y gastric bypass.  Her postoperative course was uncomplicated.  She did have quite a bit of nausea during the first 24 hours that was eventually controlled with Zofran and Phenergan and then resolved.  On the second postoperative day she denies pain or nausea.  Tolerating protein shakes.  Abdomen is soft and nontender.  Incisions clean and dry.  CBC is unremarkable.  She is felt ready for discharge.   Disposition: Home  Patient Instructions:  Allergies as of 10/25/2017   No Known Allergies     Medication List    TAKE these medications   acetaminophen 500 MG tablet Commonly known as:  TYLENOL Take 1,000 mg by mouth 2 (two) times daily as needed for moderate pain.   albuterol 108 (90 Base) MCG/ACT inhaler Commonly known as:  PROVENTIL HFA;VENTOLIN HFA TAKE 2 PUFFS BY MOUTH EVERY 6 HOURS AS NEEDED FOR WHEEZE OR SHORTNESS OF BREATH   calcium carbonate 500 MG chewable tablet Commonly known as:  TUMS - dosed in mg  elemental calcium Chew 2 tablets by mouth daily as needed for indigestion or heartburn.   escitalopram 20 MG tablet Commonly known as:  LEXAPRO Take 1 tablet (20 mg total) by mouth daily.   levothyroxine 100 MCG tablet Commonly known as:  SYNTHROID Take 1 tablet (100 mcg total) by mouth daily.   lisinopril-hydrochlorothiazide 20-12.5 MG tablet Commonly known as:  PRINZIDE,ZESTORETIC TAKE 1 TABLET BY MOUTH DAILY Notes to patient:  Monitor Blood Pressure Daily and keep a log for primary care physician.  You may need to make changes to your medications with rapid weight loss.  Monitor for signs of dehydration   multivitamin with minerals tablet Take 1 tablet by mouth daily at 3 pm.   oxyCODONE 5 MG/5ML solution Commonly known as:  ROXICODONE Take 5-10 mLs (5-10 mg total) by mouth every 4 (four) hours as needed for moderate pain or severe pain.   Spacer/Aero Chamber Mouthpiece Misc 1 each by Does not apply route every 6 (six) hours as needed.   Vitamin D (Ergocalciferol) 50000 units Caps capsule Commonly known as:  DRISDOL Take 1 capsule (50,000 Units total) by mouth every 7 (seven) days.       Activity: activity as tolerated Diet: Bariatric protein shakes Wound Care: none needed  Follow-up:  With Dr. Excell Seltzer in 3 weeks.  Signed: Edward Jolly MD, FACS  10/25/2017, 8:31 AM

## 2017-10-25 NOTE — Discharge Instructions (Signed)
° ° ° °GASTRIC BYPASS/SLEEVE ° Home Care Instructions ° ° These instructions are to help you care for yourself when you go home. ° °Call: If you have any problems. °• Call [phone] and ask for the surgeon on call °• If you need immediate help, come to the ER at [facility].  °• Tell the ER staff that you are a new post-op gastric bypass or gastric sleeve patient °  °Signs and symptoms to report: • Severe vomiting or nausea °o If you cannot keep down clear liquids for longer than 1 day, call your surgeon  °• Abdominal pain that does not get better after taking your pain medication °• Fever over 100.4° F with chills °• Heart beating over 100 beats a minute °• Shortness of breath at rest °• Chest pain °•  Redness, swelling, drainage, or foul odor at incision (surgical) sites °•  If your incisions open or pull apart °• Swelling or pain in calf (lower leg) °• Diarrhea (Loose bowel movements that happen often), frequent watery, uncontrolled bowel movements °• Constipation, (no bowel movements for 3 days) if this happens: Pick one °o Milk of Magnesia, 2 tablespoons by mouth, 3 times a day for 2 days if needed °o Stop taking Milk of Magnesia once you have a bowel movement °o Call your doctor if constipation continues °Or °o Miralax  (instead of Milk of Magnesia) following the label instructions °o Stop taking Miralax once you have a bowel movement °o Call your doctor if constipation continues °• Anything you think is not normal °  °Normal side effects after surgery: • Unable to sleep at night or unable to focus °• Irritability or moody °• Being tearful (crying) or depressed °These are common complaints, possibly related to your anesthesia medications that put you to sleep, stress of surgery, and change in lifestyle.  This usually goes away a few weeks after surgery.  If these feelings continue, call your primary care doctor. °  °Wound Care: You may have surgical glue, steri-strips, or staples over your incisions after  surgery °• Surgical glue:  Looks like a clear film over your incisions and will wear off a little at a time °• Steri-strips: Strips of tape over your incisions. You may notice a yellowish color on the skin under the steri-strips. This is used to make the   steri-strips stick better. Do not pull the steri-strips off - let them fall off °• Staples: Staples may be removed before you leave the hospital °o If you go home with staples, call [facility], [phone] at for an appointment with your surgeon’s nurse to have staples removed 10 days after surgery. °• Showering: You may shower two (2) days after your surgery unless your surgeon tells you differently °o Wash gently around incisions with warm soapy water, rinse well, and gently pat dry  °o No tub baths until staples are removed, steri-strips fall off or glue is gone.  °  °Medications: • Medications should be liquid or crushed if larger than the size of a dime °• Extended release pills (medication that release a little bit at a time through the day) should NOT be crushed or cut. (examples include XL, ER, DR, SR) °• Depending on the size and number of medications you take, you may need to space (take a few throughout the day)/change the time you take your medications so that you do not over-fill your pouch (smaller stomach) °• Make sure you follow-up with your primary care doctor to   make medication changes needed during rapid weight loss and life-style changes °• If you have diabetes, follow up with the doctor that orders your diabetes medication(s) within one week after surgery and check your blood sugar regularly. °• Do not drive while taking prescription pain medication  °• It is ok to take Tylenol by the bottle instructions with your pain medicine or instead of your pain medicine as needed.  DO NOT TAKE NSAIDS (EXAMPLES OF NSAIDS:  IBUPROFREN/ NAPROXEN)  °Diet:                    First 2 Weeks ° You will see the dietician t about two (2) weeks  after your surgery. The dietician will increase the types of foods you can eat if you are handling liquids well: °• If you have severe vomiting or nausea and cannot keep down clear liquids lasting longer than 1 day, call your surgeon @ ([phone]) °Protein Shake °• Drink at least 2 ounces of shake 5-6 times per day °• Each serving of protein shakes (usually 8 - 12 ounces) should have: °o 15 grams of protein  °o And no more than 5 grams of carbohydrate  °• Goal for protein each day: °o Men = 80 grams per day °o Women = 60 grams per day °• Protein powder may be added to fluids such as non-fat milk or Lactaid milk or unsweetened Soy/Almond milk (limit to 35 grams added protein powder per serving) ° °Hydration °• Slowly increase the amount of water and other clear liquids as tolerated (See Acceptable Fluids) °• Slowly increase the amount of protein shake as tolerated  °•  Sip fluids slowly and throughout the day.  Do not use straws. °• May use sugar substitutes in small amounts (no more than 6 - 8 packets per day; i.e. Splenda) ° °Fluid Goal °• The first goal is to drink at least 8 ounces of protein shake/drink per day (or as directed by the nutritionist); some examples of protein shakes are Syntrax Nectar, Adkins Advantage, EAS Edge HP, and Unjury. See handout from pre-op Bariatric Education Class: °o Slowly increase the amount of protein shake you drink as tolerated °o You may find it easier to slowly sip shakes throughout the day °o It is important to get your proteins in first °• Your fluid goal is to drink 64 - 100 ounces of fluid daily °o It may take a few weeks to build up to this °• 32 oz (or more) should be clear liquids  °And  °• 32 oz (or more) should be full liquids (see below for examples) °• Liquids should not contain sugar, caffeine, or carbonation ° °Clear Liquids: °• Water or Sugar-free flavored water (i.e. Fruit H2O, Propel) °• Decaffeinated coffee or tea (sugar-free) °• Crystal Lite, Wyler’s Lite,  Minute Maid Lite °• Sugar-free Jell-O °• Bouillon or broth °• Sugar-free Popsicle:   *Less than 20 calories each; Limit 1 per day ° °Full Liquids: °Protein Shakes/Drinks + 2 choices per day of other full liquids °• Full liquids must be: °o No More Than 15 grams of Carbs per serving  °o No More Than 3 grams of Fat per serving °• Strained low-fat cream soup (except Cream of Potato or Tomato) °• Non-Fat milk °• Fat-free Lactaid Milk °• Unsweetened Soy Or Unsweetened Almond Milk °• Low Sugar yogurt (Dannon Lite & Fit, Greek yogurt; Oikos Triple Zero; Chobani Simply 100; Yoplait 100 calorie Greek - No Fruit on the Bottom) ° °  °Vitamins   and Minerals  Start 1 day after surgery unless otherwise directed by your surgeon  2 Chewable Bariatric Specific Multivitamin / Multimineral Supplement with iron (Example: Bariatric Advantage Multi EA)  Chewable Calcium with Vitamin D-3 (Example: 3 Chewable Calcium Plus 600 with Vitamin D-3) o Take 500 mg three (3) times a day for a total of 1500 mg each day o Do not take all 3 doses of calcium at one time as it may cause constipation, and you can only absorb 500 mg  at a time  o Do not mix multivitamins containing iron with calcium supplements; take 2 hours apart  Menstruating women and those with a history of anemia (a blood disease that causes weakness) may need extra iron o Talk with your doctor to see if you need more iron  Do not stop taking or change any vitamins or minerals until you talk to your dietitian or surgeon  Your Dietitian and/or surgeon must approve all vitamin and mineral supplements   Activity and Exercise: Limit your physical activity as instructed by your doctor.  It is important to continue walking at home.  During this time, use these guidelines:  Do not lift anything greater than ten (10) pounds for at least two (2) weeks  Do not go back to work or drive until Engineer, production says you can  You may have sex when you feel comfortable  o It is  VERY important for female patients to use a reliable birth control method; fertility often increases after surgery  o All hormonal birth control will be ineffective for 30 days after surgery due to medications given during surgery a barrier method must be used. o Do not get pregnant for at least 18 months  Start exercising as soon as your doctor tells you that you can o Make sure your doctor approves any physical activity  Start with a simple walking program  Walk 5-15 minutes each day, 7 days per week.   Slowly increase until you are walking 30-45 minutes per day Consider joining our [place] program. [phone] or email [email or web address] Return to work November 06, 2017  Special Instructions Things to remember:  Use your CPAP when sleeping if this applies to you   [facility] has two free Bariatric Surgery Support Groups that meet monthly o The 3rd Thursday of each month, 6 pm, [facility]  o The 2nd Friday of each month, 11:45 am in the private dining room in the basement of [facility]  It is very important to keep all follow up appointments with your surgeon, dietitian, primary care physician, and behavioral health practitioner  Routine follow up schedule with your surgeon include appointments at 2-3 weeks, 6-8 weeks, 6 months, and 1 year at a minimum.  Your surgeon may request to see you more often.   o After the first year, please follow up with your bariatric surgeon and dietitian at least once a year in order to maintain best weight loss results [facility]: [place]: [phone] Bariatric Nurse Coordinator: [phone]      Reviewed and Endorsed  by [facility] Patient Education Committee, June, 2016 Edits Approved: Aug, 2018

## 2017-10-25 NOTE — Progress Notes (Signed)
Patient alert and oriented, pain is controlled. Patient is tolerating fluids, advanced to protein shake today, patient is tolerating well. Reviewed Gastric Bypass discharge instructions with patient and patient is able to articulate understanding. Provided information on BELT program, Support Group and [facility] outpatient pharmacy. All questions answered, will continue to monitor.   Total 24 hour fluid recall 660 Per dehydration protocol call back one week post op

## 2017-10-25 NOTE — Progress Notes (Signed)
Discharge instructions reviewed with patient. All questions answered. Patient wheeled to vehicle with belongings by nurse tech. 

## 2017-10-30 ENCOUNTER — Telehealth (HOSPITAL_COMMUNITY): Payer: Self-pay | Admitting: *Deleted

## 2017-11-07 ENCOUNTER — Telehealth (HOSPITAL_COMMUNITY): Payer: Self-pay

## 2017-11-07 ENCOUNTER — Encounter: Payer: BC Managed Care – PPO | Attending: General Surgery | Admitting: Registered"

## 2017-11-07 DIAGNOSIS — Z6841 Body Mass Index (BMI) 40.0 and over, adult: Secondary | ICD-10-CM | POA: Insufficient documentation

## 2017-11-07 DIAGNOSIS — E669 Obesity, unspecified: Secondary | ICD-10-CM

## 2017-11-07 DIAGNOSIS — Z713 Dietary counseling and surveillance: Secondary | ICD-10-CM | POA: Insufficient documentation

## 2017-11-07 NOTE — Telephone Encounter (Signed)
Patient called to discuss post bariatric surgery follow up questions.  See below:   1.  Tell me about your pain and pain management? Pain is mild; just feel weak and sluggish. Taking occasional Roxi  2.  Let's talk about fluid intake.  How much total fluid are you taking in? Currently 20 gm (about 15 oz) of protein; 3-4 bottles of water plus yogurt and jello  3.  How much protein have you taken in the last 2 days? Close to 60gm  4.  Have you had nausea?  Tell me about when have experienced nausea and what you did to help? Nausea today after yogurt- no meds  5.  Has the frequency or color changed with your urine?it is light  6.  Tell me what your incisions look like? No redness; dermabond sloughing  7.  Have you been passing gas? BM? Yes passing gas and had a BM  8.  If a problem or question were to arise who would you call?  Do you know contact numbers for [place], [facility], and [facility]? I have all those numbers- I'd call [facility] on call after hours  9.  How has the walking going? Short distances several times a day  10.  How are your vitamins and calcium going?  How are you taking them? Taking all vitamins -missed a dose of calcium today d/t nausea

## 2017-11-08 NOTE — Progress Notes (Signed)
Bariatric Class:  Appt start time: 1530 end time:  1630.  2 Week Post-Operative Nutrition Class  Patient was seen on 11/07/2017 for Post[facility]ative [facility]   Surgery date: 10/23/2017 Surgery type: RYGB Start weight at [facility]: 308.8 Weight today: 283.4 Weight change: 25.4  Pt states she is consuming 50+ ounces of fluid a day. Pt states she has experienced some dizziness/lightheadedness only.   TANITA  BODY COMP RESULTS  11/07/2017   BMI (kg/m^2) 45.1   Fat Mass (lbs) 144.2   Fat Free Mass (lbs) 139.2   Total Body Water (lbs) 103.2   The following the learning objectives were met by the patient during this course:  Identifies Phase 3A (Soft, High Proteins) Dietary Goals and will begin from 2 weeks post-operatively to 2 months post-operatively  Identifies appropriate sources of fluids and proteins   States protein recommendations and appropriate sources post-operatively  Identifies the need for appropriate texture modifications, mastication, and bite sizes when consuming solids  Identifies appropriate multivitamin and calcium sources post-operatively  Describes the need for physical activity post-operatively and will follow MD recommendations  States when to call healthcare provider regarding medication questions or post-operative complications  Handouts given during class include:  Phase 3A: Soft, High Protein Diet Handout  Follow-Up Plan: Patient will follow-up at [facility] in 6 weeks for 2 month post-op nutrition visit for diet advancement per MD.

## 2017-11-10 ENCOUNTER — Telehealth: Payer: Self-pay | Admitting: Registered"

## 2017-11-10 NOTE — Telephone Encounter (Signed)
RD called pt to verify fluid intake once starting soft, solid proteins 2 week post-bariatric surgery. Pt was unavailable. RD left voicemail.

## 2017-11-15 ENCOUNTER — Other Ambulatory Visit: Payer: Self-pay | Admitting: Family

## 2017-11-15 DIAGNOSIS — I10 Essential (primary) hypertension: Secondary | ICD-10-CM

## 2017-12-19 ENCOUNTER — Ambulatory Visit: Payer: BC Managed Care – PPO | Admitting: Skilled Nursing Facility1

## 2018-04-08 IMAGING — RF DG UGI W/ KUB
7 of 8 series · 12 of 14 positions shown · non-contrast
Comparison: None.

CLINICAL DATA: Preoperative examination for gastric bypass surgery.

EXAM:
UPPER GI SERIES WITH KUB
TECHNIQUE: After obtaining a scout radiograph a routine upper GI series was
performed using thin and high density barium.
FLUOROSCOPY TIME:  Fluoroscopy Time:  0.6 minutes
Radiation Exposure Index (if provided by the fluoroscopic device):
39.9 mGy.
Number of Acquired Spot Images: 3

[Series 1: t abdomen supine · 0.15mm/px · 1 of 1 slices shown]
[im 1/1]
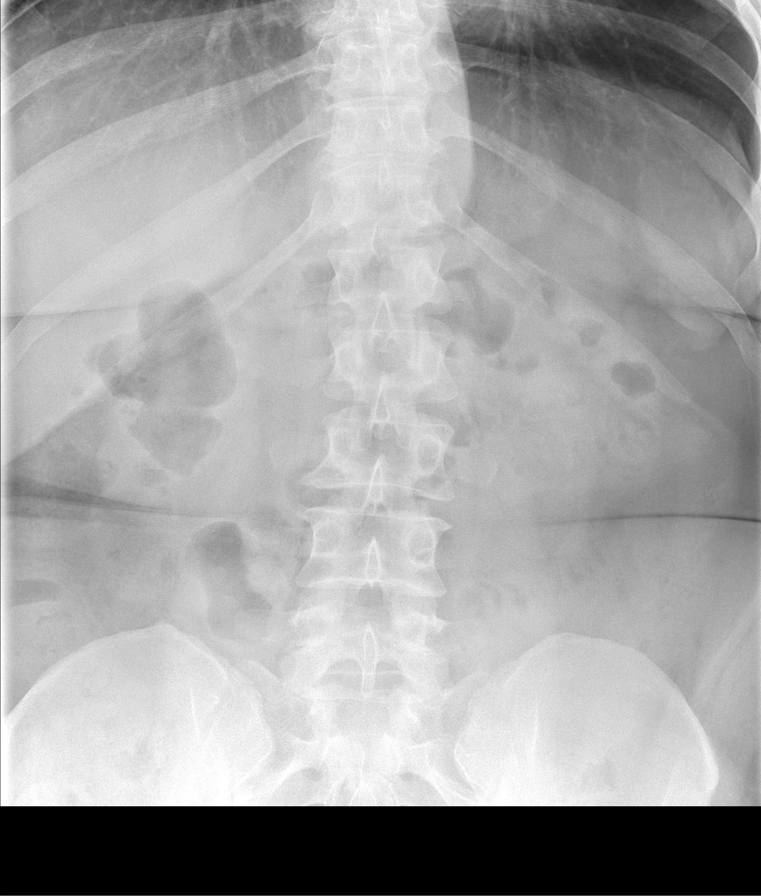

[Series 2: cp_standard · 0.51mm/px · 3 of 105 frames shown (1 of 3)]
[frame 1/105]
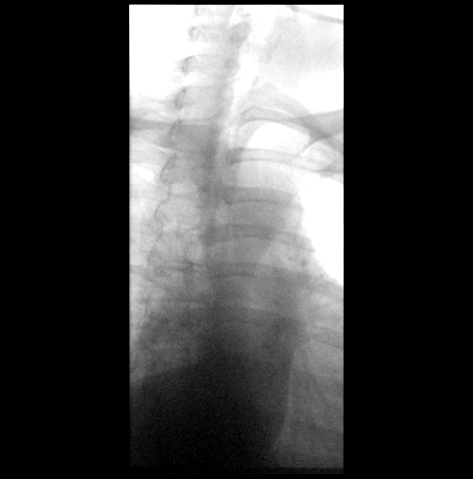
[frame 16/105]
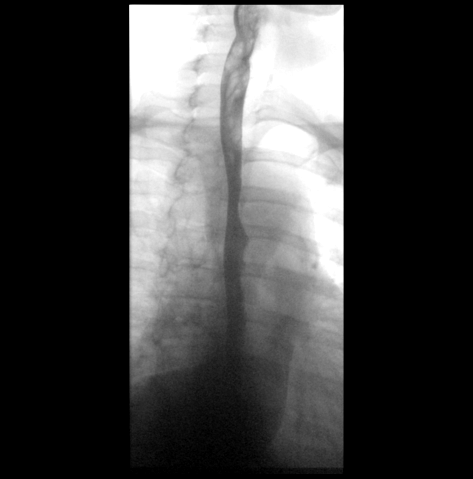
[frame 90/105]
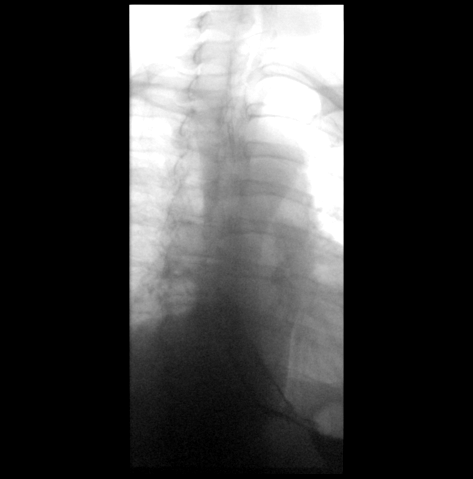

[Series 3: cp_standard · 0.51mm/px · 4 of 128 frames shown (2 of 3)]
[frame 1/128]
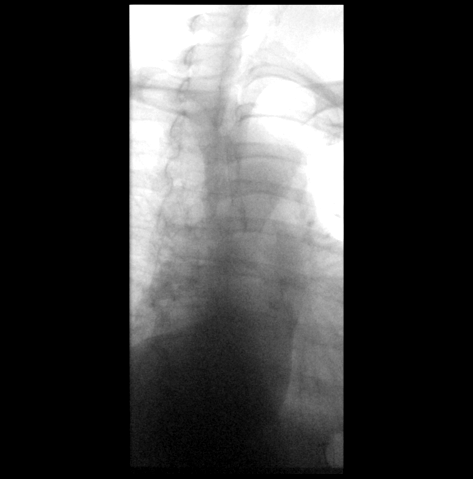
[frame 20/128]
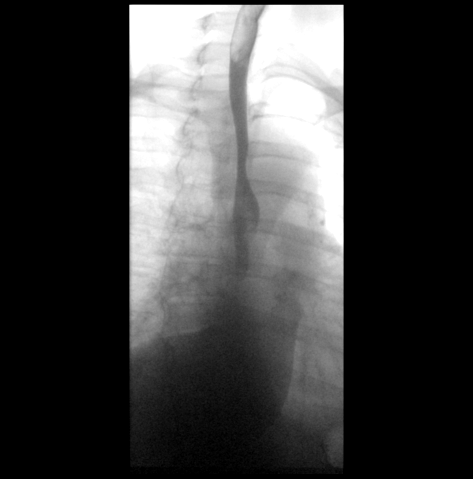
[frame 65/128]
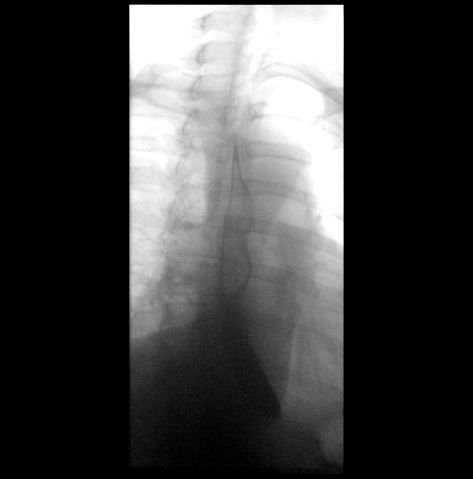
[frame 109/128]
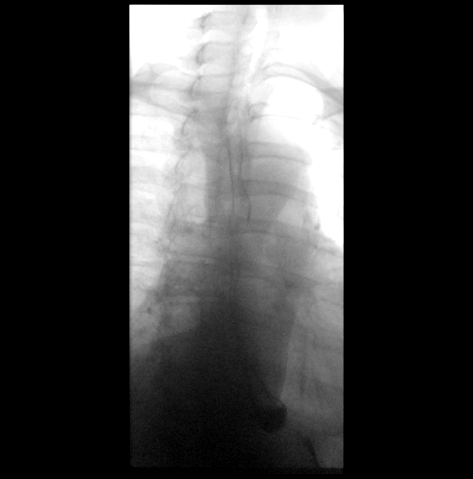

[Series 4: cp_standard · 0.26mm/px · 1 of 1 slices shown (3 of 3)]
[im 1/1]
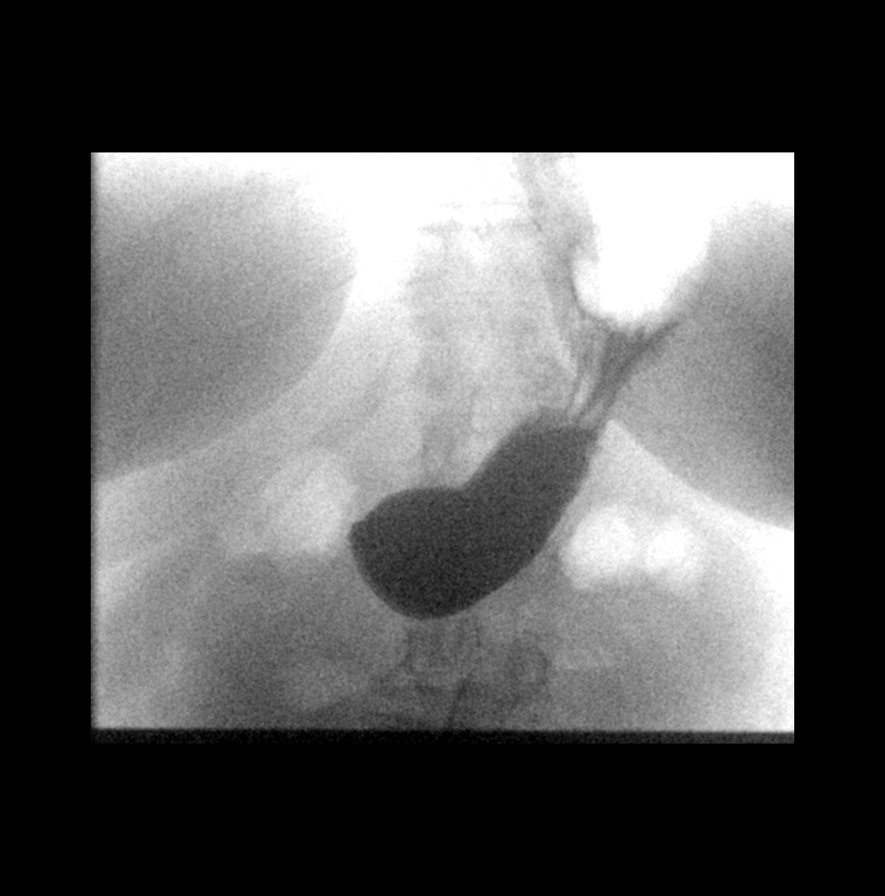

[Series 6: fluoro_barium 2fps_bw · 0.18mm/px · 1 of 1 slices shown (1 of 3)]
[im 1/1]
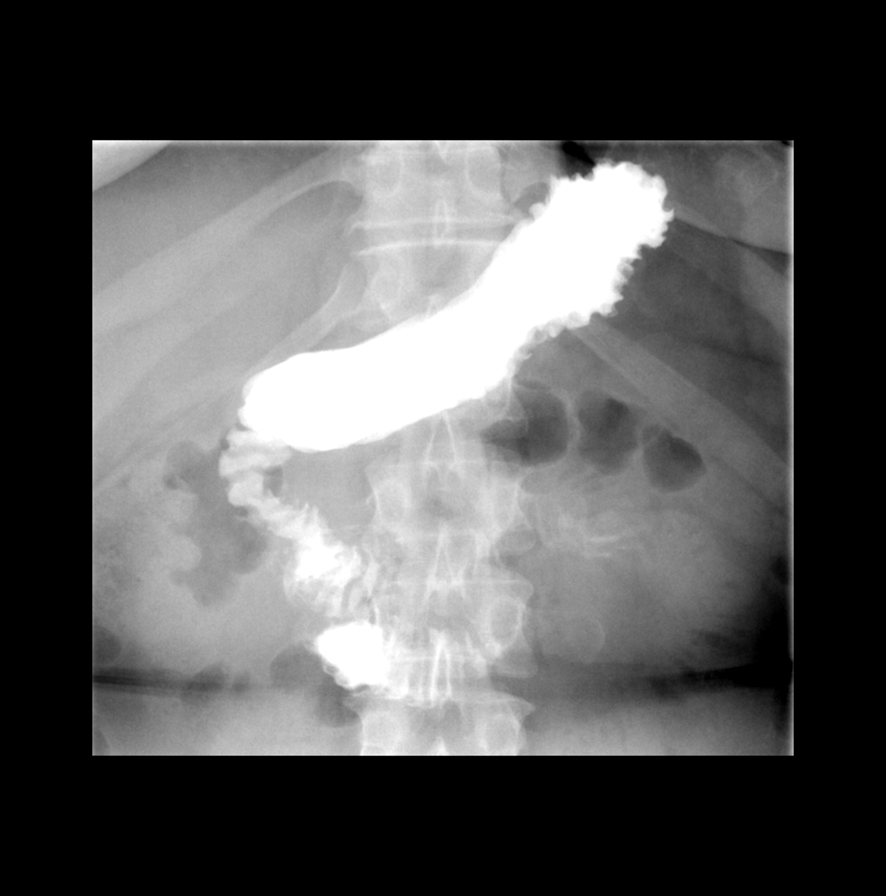

[Series 7: fluoro_barium 2fps_bw · 0.18mm/px · 1 of 1 slices shown (2 of 3)]
[im 1/1]
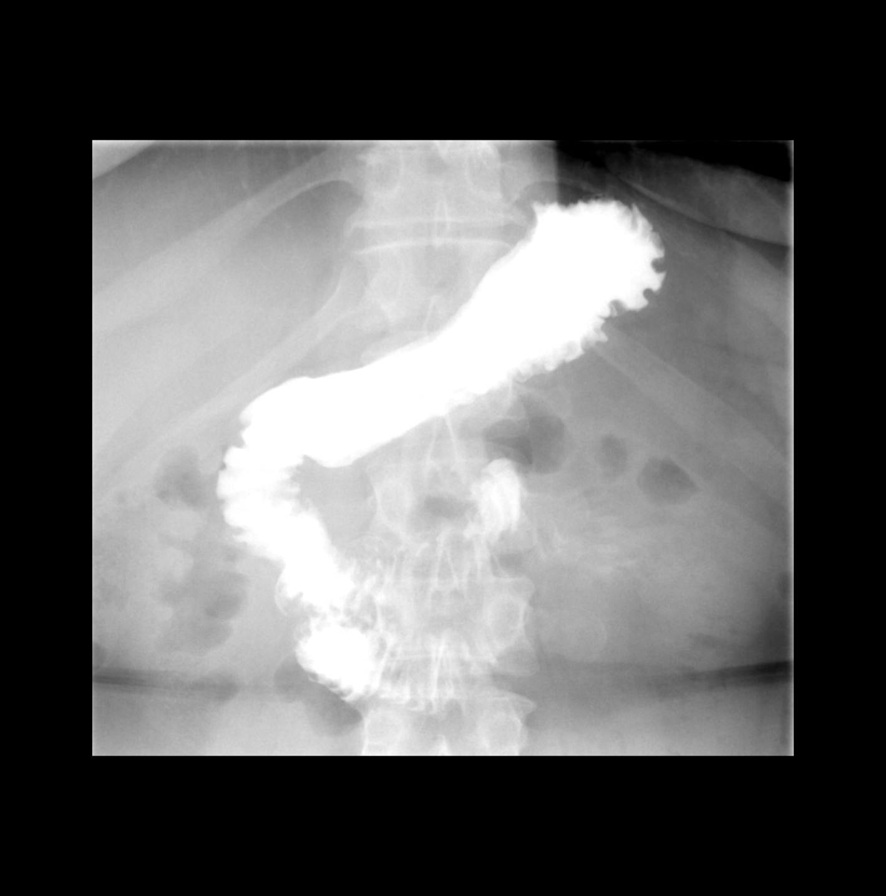

[Series 8: fluoro_barium 2fps_bw · 0.18mm/px · 1 of 1 slices shown (3 of 3)]
[im 1/1]
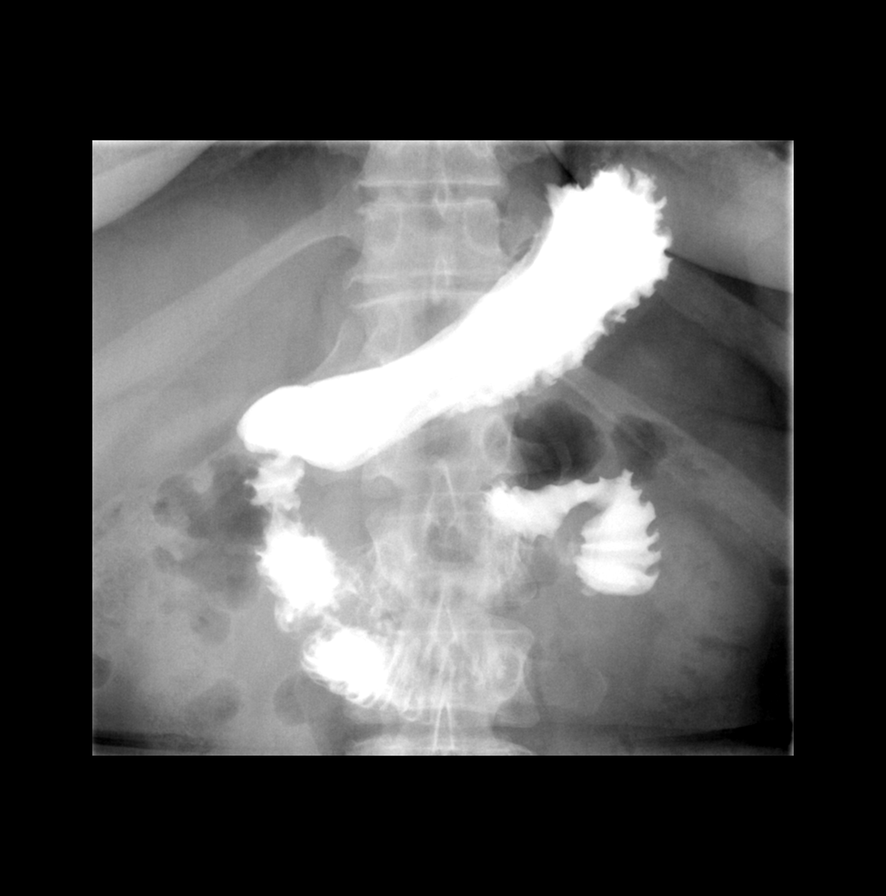

[12 of 14 positions shown; findings below may reference images not displayed]

FINDINGS: Preliminary KUB demonstrates a nonobstructive bowel gas pattern.

Primary peristaltic waves in the esophagus were normal. No
esophageal stricture, ulceration, or other significant abnormality.

No hiatal hernia. No gastroesophageal reflux was noted during the
course of the examination.

Gastric morphology and appearance appear normal. The duodenojejunal
junction appears slightly inferior to the level of the duodenal
bulb. Proximal duodenum otherwise appears normal.
IMPRESSION: 1. The duodenojejunal junction appears slightly inferior to the
level of the duodenal bulb, favored to represent a normal variant,
less likely malrotation.
2. Otherwise normal upper GI.

## 2018-07-30 ENCOUNTER — Encounter (HOSPITAL_COMMUNITY): Payer: Self-pay

## 2019-05-01 ENCOUNTER — Encounter (HOSPITAL_COMMUNITY): Payer: Self-pay

## 2020-05-13 ENCOUNTER — Encounter (HOSPITAL_COMMUNITY): Payer: Self-pay

## 2020-07-09 ENCOUNTER — Other Ambulatory Visit: Payer: Self-pay

## 2020-07-09 ENCOUNTER — Encounter: Payer: Medicaid Other | Admitting: Adult Health

## 2020-07-09 ENCOUNTER — Encounter: Payer: Self-pay | Admitting: Adult Health

## 2020-07-09 NOTE — Progress Notes (Signed)
Pt not seen had to reschedule,had [facility] and has had tubal  This encounter was created in error - please disregard. This encounter was created in error - please disregard.

## 2021-05-14 ENCOUNTER — Encounter (HOSPITAL_COMMUNITY): Payer: Self-pay | Admitting: *Deleted

## 2022-04-15 DIAGNOSIS — Z419 Encounter for procedure for purposes other than remedying health state, unspecified: Secondary | ICD-10-CM | POA: Diagnosis not present

## 2022-05-16 DIAGNOSIS — Z419 Encounter for procedure for purposes other than remedying health state, unspecified: Secondary | ICD-10-CM | POA: Diagnosis not present

## 2022-05-20 ENCOUNTER — Encounter (HOSPITAL_COMMUNITY): Payer: Self-pay | Admitting: *Deleted

## 2022-06-08 ENCOUNTER — Telehealth: Payer: Self-pay

## 2022-06-08 NOTE — Telephone Encounter (Signed)
Mychart msg sent. AS, CMA 

## 2022-06-16 DIAGNOSIS — Z419 Encounter for procedure for purposes other than remedying health state, unspecified: Secondary | ICD-10-CM | POA: Diagnosis not present

## 2022-07-15 DIAGNOSIS — Z419 Encounter for procedure for purposes other than remedying health state, unspecified: Secondary | ICD-10-CM | POA: Diagnosis not present

## 2022-08-15 DIAGNOSIS — Z419 Encounter for procedure for purposes other than remedying health state, unspecified: Secondary | ICD-10-CM | POA: Diagnosis not present

## 2022-09-14 DIAGNOSIS — Z419 Encounter for procedure for purposes other than remedying health state, unspecified: Secondary | ICD-10-CM | POA: Diagnosis not present

## 2022-10-15 DIAGNOSIS — Z419 Encounter for procedure for purposes other than remedying health state, unspecified: Secondary | ICD-10-CM | POA: Diagnosis not present

## 2022-11-14 DIAGNOSIS — Z419 Encounter for procedure for purposes other than remedying health state, unspecified: Secondary | ICD-10-CM | POA: Diagnosis not present

## 2022-12-15 DIAGNOSIS — Z419 Encounter for procedure for purposes other than remedying health state, unspecified: Secondary | ICD-10-CM | POA: Diagnosis not present

## 2023-01-15 DIAGNOSIS — Z419 Encounter for procedure for purposes other than remedying health state, unspecified: Secondary | ICD-10-CM | POA: Diagnosis not present

## 2023-02-14 DIAGNOSIS — Z419 Encounter for procedure for purposes other than remedying health state, unspecified: Secondary | ICD-10-CM | POA: Diagnosis not present

## 2023-03-17 DIAGNOSIS — Z419 Encounter for procedure for purposes other than remedying health state, unspecified: Secondary | ICD-10-CM | POA: Diagnosis not present

## 2023-04-16 DIAGNOSIS — Z419 Encounter for procedure for purposes other than remedying health state, unspecified: Secondary | ICD-10-CM | POA: Diagnosis not present

## 2023-05-17 DIAGNOSIS — Z419 Encounter for procedure for purposes other than remedying health state, unspecified: Secondary | ICD-10-CM | POA: Diagnosis not present

## 2023-05-26 ENCOUNTER — Encounter (HOSPITAL_COMMUNITY): Payer: Self-pay | Admitting: *Deleted

## 2023-05-29 ENCOUNTER — Encounter (HOSPITAL_COMMUNITY): Payer: Self-pay

## 2023-05-29 ENCOUNTER — Other Ambulatory Visit: Payer: Self-pay

## 2023-05-29 ENCOUNTER — Emergency Department (HOSPITAL_COMMUNITY): Payer: Medicaid Other

## 2023-05-29 ENCOUNTER — Inpatient Hospital Stay (HOSPITAL_COMMUNITY)
Admission: EM | Admit: 2023-05-29 | Discharge: 2023-05-30 | DRG: 812 | Disposition: A | Discharge status: Home / Self Care | Payer: Medicaid Other | Attending: Family Medicine | Admitting: Family Medicine

## 2023-05-29 DIAGNOSIS — R531 Weakness: Secondary | ICD-10-CM | POA: Diagnosis present

## 2023-05-29 DIAGNOSIS — Z823 Family history of stroke: Secondary | ICD-10-CM | POA: Diagnosis not present

## 2023-05-29 DIAGNOSIS — K648 Other hemorrhoids: Secondary | ICD-10-CM | POA: Diagnosis present

## 2023-05-29 DIAGNOSIS — R079 Chest pain, unspecified: Secondary | ICD-10-CM | POA: Diagnosis not present

## 2023-05-29 DIAGNOSIS — R0609 Other forms of dyspnea: Secondary | ICD-10-CM | POA: Diagnosis present

## 2023-05-29 DIAGNOSIS — R1310 Dysphagia, unspecified: Secondary | ICD-10-CM | POA: Diagnosis not present

## 2023-05-29 DIAGNOSIS — K3189 Other diseases of stomach and duodenum: Secondary | ICD-10-CM | POA: Diagnosis not present

## 2023-05-29 DIAGNOSIS — Z9884 Bariatric surgery status: Secondary | ICD-10-CM | POA: Diagnosis not present

## 2023-05-29 DIAGNOSIS — M549 Dorsalgia, unspecified: Secondary | ICD-10-CM | POA: Diagnosis not present

## 2023-05-29 DIAGNOSIS — Z800 Family history of malignant neoplasm of digestive organs: Secondary | ICD-10-CM

## 2023-05-29 DIAGNOSIS — F32A Depression, unspecified: Secondary | ICD-10-CM | POA: Diagnosis not present

## 2023-05-29 DIAGNOSIS — R0789 Other chest pain: Secondary | ICD-10-CM | POA: Diagnosis not present

## 2023-05-29 DIAGNOSIS — Z833 Family history of diabetes mellitus: Secondary | ICD-10-CM

## 2023-05-29 DIAGNOSIS — Z8249 Family history of ischemic heart disease and other diseases of the circulatory system: Secondary | ICD-10-CM

## 2023-05-29 DIAGNOSIS — E876 Hypokalemia: Secondary | ICD-10-CM | POA: Diagnosis not present

## 2023-05-29 DIAGNOSIS — Z825 Family history of asthma and other chronic lower respiratory diseases: Secondary | ICD-10-CM | POA: Diagnosis not present

## 2023-05-29 DIAGNOSIS — G4733 Obstructive sleep apnea (adult) (pediatric): Secondary | ICD-10-CM | POA: Diagnosis present

## 2023-05-29 DIAGNOSIS — D72819 Decreased white blood cell count, unspecified: Secondary | ICD-10-CM | POA: Diagnosis not present

## 2023-05-29 DIAGNOSIS — R634 Abnormal weight loss: Secondary | ICD-10-CM | POA: Diagnosis not present

## 2023-05-29 DIAGNOSIS — I10 Essential (primary) hypertension: Secondary | ICD-10-CM | POA: Diagnosis not present

## 2023-05-29 DIAGNOSIS — D649 Anemia, unspecified: Principal | ICD-10-CM | POA: Diagnosis present

## 2023-05-29 DIAGNOSIS — D509 Iron deficiency anemia, unspecified: Secondary | ICD-10-CM | POA: Diagnosis not present

## 2023-05-29 DIAGNOSIS — Z87891 Personal history of nicotine dependence: Secondary | ICD-10-CM | POA: Diagnosis not present

## 2023-05-29 DIAGNOSIS — K219 Gastro-esophageal reflux disease without esophagitis: Secondary | ICD-10-CM | POA: Diagnosis not present

## 2023-05-29 DIAGNOSIS — Z79899 Other long term (current) drug therapy: Secondary | ICD-10-CM

## 2023-05-29 DIAGNOSIS — M7989 Other specified soft tissue disorders: Secondary | ICD-10-CM | POA: Diagnosis not present

## 2023-05-29 LAB — CBC WITH DIFFERENTIAL/PLATELET
Abs Immature Granulocytes: 0.01 10*3/uL (ref 0.00–0.07)
Basophils Absolute: 0 10*3/uL (ref 0.0–0.1)
Basophils Relative: 1 %
Eosinophils Absolute: 0.1 10*3/uL (ref 0.0–0.5)
Eosinophils Relative: 2 %
HCT: 18 % — ABNORMAL LOW (ref 36.0–46.0)
Hemoglobin: 4.3 g/dL — CL (ref 12.0–15.0)
Immature Granulocytes: 0 %
Lymphocytes Relative: 36 %
Lymphs Abs: 0.8 10*3/uL (ref 0.7–4.0)
MCH: 15.4 pg — ABNORMAL LOW (ref 26.0–34.0)
MCHC: 23.9 g/dL — ABNORMAL LOW (ref 30.0–36.0)
MCV: 64.3 fL — ABNORMAL LOW (ref 80.0–100.0)
Monocytes Absolute: 0.2 10*3/uL (ref 0.1–1.0)
Monocytes Relative: 8 %
Neutro Abs: 1.2 10*3/uL — ABNORMAL LOW (ref 1.7–7.7)
Neutrophils Relative %: 53 %
Platelets: 344 10*3/uL (ref 150–400)
RBC: 2.8 MIL/uL — ABNORMAL LOW (ref 3.87–5.11)
RDW: 20 % — ABNORMAL HIGH (ref 11.5–15.5)
WBC: 2.3 10*3/uL — ABNORMAL LOW (ref 4.0–10.5)
nRBC: 0 % (ref 0.0–0.2)

## 2023-05-29 LAB — TROPONIN I (HIGH SENSITIVITY)
Troponin I (High Sensitivity): 2 ng/L (ref <18)
Troponin I (High Sensitivity): <2 ng/L (ref <18)

## 2023-05-29 LAB — POC OCCULT BLOOD, ED: Fecal Occult Bld: NEGATIVE

## 2023-05-29 LAB — URINALYSIS, ROUTINE W REFLEX MICROSCOPIC
Bilirubin Urine: NEGATIVE
Glucose, UA: NEGATIVE mg/dL
Hgb urine dipstick: NEGATIVE
Ketones, ur: NEGATIVE mg/dL
Leukocytes,Ua: NEGATIVE
Nitrite: NEGATIVE
Protein, ur: NEGATIVE mg/dL
Specific Gravity, Urine: 1.016 (ref 1.005–1.030)
pH: 7 (ref 5.0–8.0)

## 2023-05-29 LAB — HEPATIC FUNCTION PANEL
ALT: 11 U/L (ref 0–44)
AST: 16 U/L (ref 15–41)
Albumin: 3.7 g/dL (ref 3.5–5.0)
Alkaline Phosphatase: 62 U/L (ref 38–126)
Bilirubin, Direct: 0.1 mg/dL (ref 0.0–0.2)
Indirect Bilirubin: 0.4 mg/dL (ref 0.3–0.9)
Total Bilirubin: 0.5 mg/dL (ref 0.0–1.2)
Total Protein: 6.5 g/dL (ref 6.5–8.1)

## 2023-05-29 LAB — PREPARE RBC (CROSSMATCH)

## 2023-05-29 LAB — MRSA NEXT GEN BY PCR, NASAL: MRSA by PCR Next Gen: NOT DETECTED

## 2023-05-29 LAB — BASIC METABOLIC PANEL
Anion gap: 5 (ref 5–15)
BUN: 8 mg/dL (ref 6–20)
CO2: 22 mmol/L (ref 22–32)
Calcium: 8.7 mg/dL — ABNORMAL LOW (ref 8.9–10.3)
Chloride: 108 mmol/L (ref 98–111)
Creatinine, Ser: 0.56 mg/dL (ref 0.44–1.00)
GFR, Estimated: >60 mL/min (ref >60)
Glucose, Bld: 97 mg/dL (ref 70–99)
Potassium: 3.4 mmol/L — ABNORMAL LOW (ref 3.5–5.1)
Sodium: 135 mmol/L (ref 135–145)

## 2023-05-29 LAB — IRON AND TIBC
Iron: 9 ug/dL — ABNORMAL LOW (ref 28–170)
Saturation Ratios: 2 % — ABNORMAL LOW (ref 10.4–31.8)
TIBC: 472 ug/dL — ABNORMAL HIGH (ref 250–450)
UIBC: 463 ug/dL

## 2023-05-29 LAB — RETICULOCYTES
Immature Retic Fract: 14.9 % (ref 2.3–15.9)
RBC.: 2.76 MIL/uL — ABNORMAL LOW (ref 3.87–5.11)
Retic Count, Absolute: 37.3 10*3/uL (ref 19.0–186.0)
Retic Ct Pct: 1.4 % (ref 0.4–3.1)

## 2023-05-29 LAB — FOLATE: Folate: 17.5 ng/mL (ref >5.9)

## 2023-05-29 LAB — FERRITIN: Ferritin: 1 ng/mL — ABNORMAL LOW (ref 11–307)

## 2023-05-29 LAB — HEMOGLOBIN AND HEMATOCRIT, BLOOD
HCT: 26.2 % — ABNORMAL LOW (ref 36.0–46.0)
Hemoglobin: 7.5 g/dL — ABNORMAL LOW (ref 12.0–15.0)

## 2023-05-29 LAB — POC URINE PREG, ED: Preg Test, Ur: NEGATIVE

## 2023-05-29 LAB — VITAMIN B12: Vitamin B-12: 202 pg/mL (ref 180–914)

## 2023-05-29 MED ORDER — SODIUM CHLORIDE 0.9% FLUSH: 3.0000 mL | INTRAVENOUS | Status: DC | PRN

## 2023-05-29 MED ORDER — BISACODYL 10 MG RE SUPP: 10.0000 mg | Freq: Every day | RECTAL | Status: DC | PRN

## 2023-05-29 MED ORDER — PEG 3350-KCL-NA BICARB-NACL 420 G PO SOLR
4000.0000 mL | Freq: Once | ORAL | Status: AC
  Administered 2023-05-29: 4000 mL via ORAL

## 2023-05-29 MED ORDER — FERROUS SULFATE 325 (65 FE) MG PO TABS: 325.0000 mg | ORAL_TABLET | Freq: Every day | ORAL | Status: DC

## 2023-05-29 MED ORDER — ACETAMINOPHEN 325 MG PO TABS: 650.0000 mg | ORAL_TABLET | Freq: Four times a day (QID) | ORAL | Status: DC | PRN

## 2023-05-29 MED ORDER — POLYETHYLENE GLYCOL 3350 17 G PO PACK: 17.0000 g | PACK | Freq: Every day | ORAL | Status: DC | PRN

## 2023-05-29 MED ORDER — ONDANSETRON HCL 4 MG PO TABS: 4.0000 mg | ORAL_TABLET | Freq: Four times a day (QID) | ORAL | Status: DC | PRN

## 2023-05-29 MED ORDER — SODIUM CHLORIDE 0.9% FLUSH
3.0000 mL | Freq: Two times a day (BID) | INTRAVENOUS | Status: DC
  Administered 2023-05-29 – 2023-05-30 (×2): 3 mL via INTRAVENOUS

## 2023-05-29 MED ORDER — TRAZODONE HCL 50 MG PO TABS: 50.0000 mg | ORAL_TABLET | Freq: Every evening | ORAL | Status: DC | PRN

## 2023-05-29 MED ORDER — ACETAMINOPHEN 650 MG RE SUPP: 650.0000 mg | Freq: Four times a day (QID) | RECTAL | Status: DC | PRN

## 2023-05-29 MED ORDER — CYANOCOBALAMIN 1000 MCG/ML IJ SOLN
1000.0000 ug | Freq: Once | INTRAMUSCULAR | Status: AC
  Administered 2023-05-29: 1000 ug via INTRAMUSCULAR
  Filled 2023-05-29: qty 1

## 2023-05-29 MED ORDER — MULTI-VITAMIN/MINERALS PO TABS: 1.0000 | ORAL_TABLET | Freq: Every day | ORAL | Status: DC

## 2023-05-29 MED ORDER — SODIUM CHLORIDE 0.9% IV SOLUTION: Freq: Once | INTRAVENOUS | Status: DC

## 2023-05-29 MED ORDER — PANTOPRAZOLE SODIUM 40 MG IV SOLR
40.0000 mg | Freq: Two times a day (BID) | INTRAVENOUS | Status: DC
  Administered 2023-05-29 – 2023-05-30 (×3): 40 mg via INTRAVENOUS
  Filled 2023-05-29 (×3): qty 10

## 2023-05-29 MED ORDER — CHLORHEXIDINE GLUCONATE CLOTH 2 % EX PADS
6.0000 | MEDICATED_PAD | Freq: Every day | CUTANEOUS | Status: DC
  Administered 2023-05-30: 6 via TOPICAL

## 2023-05-29 MED ORDER — ADULT MULTIVITAMIN W/MINERALS CH: 1.0000 | ORAL_TABLET | Freq: Every day | ORAL | Status: DC

## 2023-05-29 MED ORDER — SODIUM CHLORIDE 0.9 % IV SOLN
INTRAVENOUS | Status: DC | PRN
Start: 2023-05-29 — End: 2023-05-30

## 2023-05-29 MED ORDER — ONDANSETRON HCL 4 MG/2ML IJ SOLN
4.0000 mg | Freq: Four times a day (QID) | INTRAMUSCULAR | Status: DC | PRN
Start: 2023-05-29 — End: 2023-05-30

## 2023-05-29 MED ORDER — POTASSIUM CHLORIDE CRYS ER 20 MEQ PO TBCR
40.0000 meq | EXTENDED_RELEASE_TABLET | Freq: Once | ORAL | Status: AC
Start: 2023-05-29 — End: 2023-05-29
  Administered 2023-05-29: 40 meq via ORAL
  Filled 2023-05-29: qty 2

## 2023-05-29 NOTE — ED Notes (Signed)
 Poc occult blood was negative and poc pregnancy was also negative

## 2023-05-29 NOTE — ED Triage Notes (Signed)
 Pt arrived via POV c/o chest pain that began 3-4 days. Pt reports pain radiated to to her back and some swelling to her feet.

## 2023-05-29 NOTE — ED Notes (Signed)
 Blood consent signed

## 2023-05-29 NOTE — ED Provider Notes (Signed)
 [facility] AT [facility] Provider Note   CSN: [id number] Arrival date & time: 05/29/23  9062     History  Chief Complaint  Patient presents with   Chest Pain    Heather Hardin is a 48 y.o. female.   Chest Pain Associated symptoms: back pain, fatigue and shortness of breath   Patient presents for chest pain, fatigue, shortness of breath.  Medical history includes anemia, GERD, HTN, sleep apnea, depression, HLD.  She takes no medicines at baseline.  5 days ago, she noticed exertional chest pain, shortness of breath, fatigue.  This has worsened.  Pain radiates to mid back.  She denies any current pain at rest.  She denies any recent blood loss.  She states that her stools are been normal in color.  She will have occasional menstrual cycles, with a period approximately every 3 months.  These have not seemed to be excessive when they do occur.  She has noticed some recent BLE swelling.     Home Medications Prior to Admission medications   Medication Sig Start Date End Date Taking? Authorizing Provider  acetaminophen  (TYLENOL ) 500 MG tablet Take 1,000 mg by mouth 2 (two) times daily as needed for moderate pain.    [provider]  albuterol (PROVENTIL HFA;VENTOLIN HFA) 108 (90 Base) MCG/ACT inhaler TAKE 2 PUFFS BY MOUTH EVERY 6 HOURS AS NEEDED FOR WHEEZE OR SHORTNESS OF BREATH 05/29/17   Hawks, Bari A, FNP  Ferrous Sulfate (IRON PO) Take by mouth.    [provider]  Multiple Vitamins-Minerals (MULTIVITAMIN WITH MINERALS) tablet Take 1 tablet by mouth daily at 3 pm.     [provider]  Vitamin D , Ergocalciferol , (DRISDOL) 50000 units CAPS capsule Take 1 capsule (50,000 Units total) by mouth every 7 (seven) days. 09/12/17   Lavell Bari LABOR, FNP      Allergies    Patient has no known allergies.    Review of Systems   Review of Systems  Constitutional:  Positive for fatigue.  Respiratory:  Positive for shortness of breath.    Cardiovascular:  Positive for chest pain.  Musculoskeletal:  Positive for back pain.  All other systems reviewed and are negative.   Physical Exam Updated Vital Signs BP 134/70   Pulse 60   Temp 98.3 F (36.8 C) (Oral)   Resp (!) 21   Ht 5' 7 (1.702 m)   Wt 73.3 kg   SpO2 100%   BMI 25.31 kg/m  Physical Exam Vitals and nursing note reviewed.  Constitutional:      General: She is not in acute distress.    Appearance: She is well-developed. She is not toxic-appearing or diaphoretic.  HENT:     Head: Normocephalic and atraumatic.  Eyes:     Conjunctiva/sclera: Conjunctivae normal.  Cardiovascular:     Rate and Rhythm: Normal rate and regular rhythm.  Pulmonary:     Effort: Pulmonary effort is normal. No respiratory distress.  Chest:     Chest wall: No tenderness.  Abdominal:     Palpations: Abdomen is soft.     Tenderness: There is no abdominal tenderness.  Musculoskeletal:        General: No swelling.     Cervical back: Normal range of motion and neck supple.     Right lower leg: Edema present.     Left lower leg: Edema present.  Skin:    General: Skin is warm and dry.     Capillary Refill: Capillary  refill takes less than 2 seconds.     Coloration: Skin is pale.  Neurological:     Mental Status: She is alert.  Psychiatric:        Mood and Affect: Mood normal.     ED Results / Procedures / Treatments   Labs (all labs ordered are listed, but only abnormal results are displayed) Labs Reviewed  BASIC METABOLIC PANEL - Abnormal; Notable for the following components:      Result Value   Potassium 3.4 (*)    Calcium 8.7 (*)    All other components within normal limits  CBC WITH DIFFERENTIAL/PLATELET - Abnormal; Notable for the following components:   WBC 2.3 (*)    RBC 2.80 (*)    Hemoglobin 4.3 (*)    HCT 18.0 (*)    MCV 64.3 (*)    MCH 15.4 (*)    MCHC 23.9 (*)    RDW 20.0 (*)    Neutro Abs 1.2 (*)    All other components within normal limits  IRON  AND TIBC - Abnormal; Notable for the following components:   Iron 9 (*)    TIBC 472 (*)    Saturation Ratios 2 (*)    All other components within normal limits  FERRITIN - Abnormal; Notable for the following components:   Ferritin 1 (*)    All other components within normal limits  RETICULOCYTES - Abnormal; Notable for the following components:   RBC. 2.76 (*)    All other components within normal limits  URINALYSIS, ROUTINE W REFLEX MICROSCOPIC - Abnormal; Notable for the following components:   APPearance HAZY (*)    All other components within normal limits  MRSA NEXT GEN BY PCR, NASAL  VITAMIN B12  FOLATE  HEPATIC FUNCTION PANEL  POC URINE PREG, ED  POC OCCULT BLOOD, ED  TYPE AND SCREEN  PREPARE RBC (CROSSMATCH)  TROPONIN I (HIGH SENSITIVITY)  TROPONIN I (HIGH SENSITIVITY)    EKG EKG Interpretation Date/Time:  Monday May 29 2023 09:50:09 EST Ventricular Rate:  78 PR Interval:  144 QRS Duration:  76 QT Interval:  412 QTC Calculation: 469 R Axis:   94  Text Interpretation: Sinus rhythm Rightward axis Abnormal QRS-T angle, consider primary T wave abnormality Abnormal ECG Confirmed by Melvenia Motto ([id number]) on 05/29/2023 10:43:48 AM  Radiology DG Chest 2 View Result Date: 05/29/2023 CLINICAL DATA:  Chest pain for 3-4 days. Pain radiates into the back with swelling of the feet. EXAM: CHEST - 2 VIEW COMPARISON:  Radiographs 04/10/2017. FINDINGS: Interval significant weight loss. The heart size and mediastinal contours are stable. The lungs are clear. There is no pleural effusion or pneumothorax. Mild degenerative changes in the spine without acute osseous abnormality. IMPRESSION: No evidence of acute cardiopulmonary process. Interval significant weight loss. Electronically Signed   By: Elsie Perone M.D.   On: 05/29/2023 10:51    Procedures Procedures    Medications Ordered in ED Medications  0.9 %  sodium chloride  infusion (Manually program via Guardrails IV Fluids) (0  mLs Intravenous Hold 05/29/23 1219)  pantoprazole  (PROTONIX ) injection 40 mg (40 mg Intravenous Given 05/29/23 1236)  Chlorhexidine  Gluconate Cloth 2 % PADS 6 each (has no administration in time range)  potassium chloride  SA (KLOR-CON  M) CR tablet 40 mEq (40 mEq Oral Given 05/29/23 1237)    ED Course/ Medical Decision Making/ A&P  Medical Decision Making Amount and/or Complexity of Data Reviewed Labs: ordered. Radiology: ordered.  Risk Prescription drug management. Decision regarding hospitalization.   This patient presents to the ED for concern of the, shortness of breath, chest pain, this involves an extensive number of treatment options, and is a complaint that carries with it a high risk of complications and morbidity.  The differential diagnosis includes ACS, anemia, metabolic derangements, neoplasm   Co morbidities that complicate the patient evaluation  anemia, GERD, HTN, sleep apnea, depression, HLD   Additional history obtained:  Additional history obtained from N/A External records from outside source obtained and reviewed including EMR   Lab Tests:  I Ordered, and personally interpreted labs.  The pertinent results include: Severe anemia, leukopenia, normal kidney function, slight hypokalemia with otherwise normal electrolytes   Imaging Studies ordered:  I ordered imaging studies including chest x-ray I independently visualized and interpreted imaging which showed no acute findings I agree with the radiologist interpretation   Cardiac Monitoring: / EKG:  The patient was maintained on a cardiac monitor.  I personally viewed and interpreted the cardiac monitored which showed an underlying rhythm of: Sinus rhythm   Consultations Obtained:  I requested consultation with the oncologist, Dr. MARLA,  and discussed lab and imaging findings as well as pertinent plan - they recommend: Anemia panel consistent with severe iron deficiency.   Patient will likely need scheduled iron infusions.   Problem List / ED Course / Critical interventions / Medication management  Patient presenting for chest pain, shortness of breath, fatigue over the past 5 days.  Vital signs on arrival in the ED are notable only for a mildly widened pulse pressure.  Workup, initiated prior to patient being bedded in the ED is notable for severe anemia and moderate leukopenia.  Platelet count is normal.  EKG and troponin are reassuring.  On exam, patient is pale in appearance.  She does have some equal bilateral lower leg swelling.  Currently, her breathing is unlabored.  She does not have any discomfort at rest.  She denies any known recent blood loss.  On DRE, no stool was present on glove.  Hemoccult testing was negative.  Patient was consented for blood transfusion.  Anemia labs were ordered.  I spoke with oncologist, Dr. MARLA, who feels that this is an iron deficiency issue rather than a neoplastic process.  Patient will likely need scheduled outpatient iron infusions.  For now, patient was admitted for further management. I ordered medication including PRBCs for symptomatic anemia; potassium chloride  for hypokalemia Reevaluation of the patient after these medicines showed that the patient improved I have reviewed the patients home medicines and have made adjustments as needed   Social Determinants of Health:  Has PCP  CRITICAL CARE Performed by: Bernardino Fireman   Total critical care time: 34 minutes  Critical care time was exclusive of separately billable procedures and treating other patients.  Critical care was necessary to treat or prevent imminent or life-threatening deterioration.  Critical care was time spent personally by me on the following activities: development of treatment plan with patient and/or surrogate as well as nursing, discussions with consultants, evaluation of patient's response to treatment, examination of patient, obtaining history  from patient or surrogate, ordering and performing treatments and interventions, ordering and review of laboratory studies, ordering and review of radiographic studies, pulse oximetry and re-evaluation of patient's condition.         Final Clinical Impression(s) / ED Diagnoses Final diagnoses:  Symptomatic anemia  Rx / DC Orders ED Discharge Orders     None         Melvenia Motto, MD 05/29/23 [phone]

## 2023-05-29 NOTE — H&P (Signed)
 Patient Demographics:    Heather Hardin, is a 48 y.o. female  MRN: [id number]   DOB - 01-21-1976  Admit Date - 05/29/2023  Outpatient Primary MD for the patient is Lavell Bari LABOR, FNP   Assessment & Plan:   Assessment and Plan:  1) acute symptomatic anemia----hemoglobin is 4.3, no recent hemoglobin available by 12 between 12 and 13 back in 2019 --Denies NSAID use -Menstrual cycles only occur about once every 3 to 4 months or so and they are usually at times heavy -She is found to have hemoglobin of 4.3 in the ED with hematocrit of 18 -Ferritin is only 1, serum iron is 9 iron saturation is 2 -Transfuse 3 units of PRBC -Stool is Hemoccult negative -Protonix  as ordered -GI consult for endoluminal evaluation requested--EGD and colonoscopy planned for 05/30/2023 (Capsule study would not show all of small intestine due to anatomy. Could pursue CTE if no obvious source endoscopically. 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 Barriers: Not Clinically Stable-   With History of - Reviewed by me  Past Medical History:  Diagnosis Date   Anemia    in youth ; sometimes iron has been low    Depression    Family history of adverse reaction to anesthesia    reports son is slow to wake    GERD (gastroesophageal reflux disease)    Hypertension    PONV (postoperative nausea  and vomiting)    Sleep apnea    uses CPAP occ    Thyroid  disease       Past Surgical History:  Procedure Laterality Date   CESAREAN SECTION  1997, 1999, 2004   GASTRIC ROUX-EN-Y N/A 10/23/2017   Procedure: LAPAROSCOPIC ROUX-EN-Y GASTRIC BYPASS WITH UPPER ENDOSCOPY, ERAS Pathway;  Surgeon: Mikell Katz, MD;  Location: [facility];  Service: General;  Laterality: N/A;   TUBAL LIGATION  2004   Chief Complaint  Patient presents with   Chest Pain      HPI:    Heather Hardin  is a 48 y.o. 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 which led to significant weight loss and resolution of her HTN and OSA presents to the ED with complaints of chest discomfort , generalized weakness, fatigue and dyspnea on exertion with leg swelling--symptoms of worsening over the last 5  days or so -No pleuritic symptoms -Denies emesis ,denies melena -No headaches -Denies NSAID use -Menstrual cycles only occur about once every 3 to 4 months or so and they are usually at times heavy -She is found to have hemoglobin of 4.3 in the ED with hematocrit of 18 -Ferritin is only 1, serum iron is 9 iron saturation is 2 -Chest x-ray without acute findings and troponin is negative -In the ED stool occult blood and pregnancy test negative -Chest x-ray without acute findings -Troponin is less than 2 -LFTs are not elevated -Potassium is 3.4 with a creatinine of 0.56 -Borderline low B12 at 202 folate is not low -UA is unremarkable -EKG sinus rhythm   Review of systems:    In addition to the HPI above,   A full Review of  Systems was done, all other systems reviewed are negative except as noted above in HPI , .    Social History:  Reviewed by me    Social History   Tobacco Use   Smoking status: Former    Current packs/day: 0.00    Average packs/day: 1 pack/day for 4.0 years (4.0 ttl pk-yrs)    Types: Cigarettes    Start date: 05/17/1991    Quit date: 05/17/1995    Years since  quitting: 28.0   Smokeless tobacco: Never  Substance Use Topics   Alcohol use: Yes    Alcohol/week: 0.0 standard drinks of alcohol    Comment: very rare     Family History :  Reviewed by me    Family History  Problem Relation Age of Onset   Cancer Father        throat   Hypertension Mother    COPD Mother    HIV Mother    Heart failure Maternal Grandfather    Heart failure Maternal Grandmother    Stroke Maternal Grandmother    Hypertension Maternal Grandmother    Osteoarthritis Maternal Grandmother    Diabetes Sister     Home Medications:   Prior to Admission medications   Medication Sig Start Date End Date Taking? Authorizing Provider  acetaminophen  (TYLENOL ) 500 MG tablet Take 1,000 mg by mouth 2 (two) times daily as needed for moderate pain.    [provider]  albuterol (PROVENTIL HFA;VENTOLIN HFA) 108 (90 Base) MCG/ACT inhaler TAKE 2 PUFFS BY MOUTH EVERY 6 HOURS AS NEEDED FOR WHEEZE OR SHORTNESS OF BREATH 05/29/17   Hawks, Bari A, FNP  Ferrous Sulfate (IRON PO) Take by mouth.    [provider]  Multiple Vitamins-Minerals (MULTIVITAMIN WITH MINERALS) tablet Take 1 tablet by mouth daily at 3 pm.     [provider]  Vitamin D , Ergocalciferol , (DRISDOL) 50000 units CAPS capsule Take 1 capsule (50,000 Units total) by mouth every 7 (seven) days. 09/12/17   Lavell Bari LABOR, FNP     Allergies:    No Known Allergies   Physical Exam:   Vitals  Blood pressure 123/71, pulse (!) 58, temperature 98.9 F (37.2 C), temperature source Oral, resp. rate 15, height 5' 7 (1.702 m), weight 73.3 kg, SpO2 100%.  Physical Examination: General appearance - alert,  in no distress Mental status - alert, oriented to person, place, and time,  Eyes - sclera anicteric Neck - supple, no JVD elevation , Chest - clear  to auscultation bilaterally, symmetrical air movement, Heart - S1 and S2 normal, regular  Abdomen - soft, nontender, nondistended, +BS, healed  abdominal scars Neurological - screening mental status exam normal, neck supple  without rigidity, cranial nerves II through XII intact, DTR's normal and symmetric Extremities - no pedal edema noted, intact peripheral pulses  Skin - warm, dry    Data Review:    CBC Recent Labs  Lab 05/29/23 1003  WBC 2.3*  HGB 4.3*  HCT 18.0*  PLT 344  MCV 64.3*  MCH 15.4*  MCHC 23.9*  RDW 20.0*  LYMPHSABS 0.8  MONOABS 0.2  EOSABS 0.1  BASOSABS 0.0   ------------------------------------------------------------------------------------------------------------------  Chemistries  Recent Labs  Lab 05/29/23 1003 05/29/23 1059  NA 135  --   K 3.4*  --   CL 108  --   CO2 22  --   GLUCOSE 97  --   BUN 8  --   CREATININE 0.56  --   CALCIUM 8.7*  --   AST  --  16  ALT  --  11  ALKPHOS  --  62  BILITOT  --  0.5   ------------------------------------------------------------------------------------------------------------------ estimated creatinine clearance is 83.6 mL/min (by C-G formula based on SCr of 0.56 mg/dL). ------------------------------------------------------------------------------------------------------------------  Urinalysis    Component Value Date/Time   COLORURINE YELLOW 05/29/2023 1118   APPEARANCEUR HAZY (A) 05/29/2023 1118   APPEARANCEUR Cloudy (A) 06/20/2017 1642   LABSPEC 1.016 05/29/2023 1118   PHURINE 7.0 05/29/2023 1118   GLUCOSEU NEGATIVE 05/29/2023 1118   HGBUR NEGATIVE 05/29/2023 1118   BILIRUBINUR NEGATIVE 05/29/2023 1118   BILIRUBINUR Negative 06/20/2017 1642   KETONESUR NEGATIVE 05/29/2023 1118   PROTEINUR NEGATIVE 05/29/2023 1118   UROBILINOGEN negative 08/22/2014 1547   NITRITE NEGATIVE 05/29/2023 1118   LEUKOCYTESUR NEGATIVE 05/29/2023 1118    Imaging Results:    DG Chest 2 View Result Date: 05/29/2023 CLINICAL DATA:  Chest pain for 3-4 days. Pain radiates into the back with swelling of the feet. EXAM: CHEST - 2 VIEW COMPARISON:  Radiographs  04/10/2017. FINDINGS: Interval significant weight loss. The heart size and mediastinal contours are stable. The lungs are clear. There is no pleural effusion or pneumothorax. Mild degenerative changes in the spine without acute osseous abnormality. IMPRESSION: No evidence of acute cardiopulmonary process. Interval significant weight loss. Electronically Signed   By: Elsie Perone M.D.   On: 05/29/2023 10:51   Radiological Exams on Admission: DG Chest 2 View Result Date: 05/29/2023 CLINICAL DATA:  Chest pain for 3-4 days. Pain radiates into the back with swelling of the feet. EXAM: CHEST - 2 VIEW COMPARISON:  Radiographs 04/10/2017. FINDINGS: Interval significant weight loss. The heart size and mediastinal contours are stable. The lungs are clear. There is no pleural effusion or pneumothorax. Mild degenerative changes in the spine without acute osseous abnormality. IMPRESSION: No evidence of acute cardiopulmonary process. Interval significant weight loss. Electronically Signed   By: Elsie Perone M.D.   On: 05/29/2023 10:51   DVT Prophylaxis -SCD  AM Labs Ordered, also please review Full Orders  Family Communication: Admission, patients condition and plan of care including tests being ordered have been discussed with the patient  who indicate understanding and agree with the plan   Condition   stable  Rendall Carwin M.D on 05/29/2023 at 6:36 PM Go to [email or web address] -  for contact info  [facility] - Office  [phone]

## 2023-05-29 NOTE — Progress Notes (Addendum)
 Consent signed and placed on chart for colonoscopy and EGD tomorrow. Bowel prep has been started on patient.

## 2023-05-29 NOTE — Consult Note (Signed)
 Gastroenterology Consult   Referring Provider:  Dr. Rendall  Primary Care Physician:  Lavell Bari LABOR, FNP Primary Gastroenterologist:  Dr.Carver, previously unassigned  Patient ID: Heather Hardin; [id number]; [birth year]   Admit date: 05/29/2023  LOS: 0 days   Date of Consultation: 05/29/2023  Reason for Consultation:  Profound anemia  History of Present Illness   Heather Hardin is a 48 y.o. year old female with a history of Roux-en-Y gastric bypass 2019, GERD (resolved), OSA (resolved),and  HTN (resolved), presenting with chest pain, DOE, and found to have profound anemia with Hgb 4.3. GI consult requested.    Hgb on presentation profoundly low at 4.3, Hct 18, microcytic anemia. Ferritin 1, iron 9, iron sats 2, heme negative. Troponin flat. CXR unrevealing.    No PCP for a few years as had lost insurance. No overt GI bleeding. Recently had large amount of dental work done. No NSAIDs. Only tylenol . No abdominal pain. No changes in bowel habits. Eats leafy greens.   Has not been taking vitamins for a few months. Was taking a MVI but not extra iron. No prescription medications. Hx of IDA as a child. Stays cold all the time. Irregular menstrual cycles. Will go 4-5 months and be heavy. Sporadic. No abdominal pain.    No prior colonoscopy or EGD.  No FH colon cancer or polyps.   Father died of throat cancer, mother COPD, CAD, hx of drug use.    Past Medical History:  Diagnosis Date   Anemia    in youth ; sometimes iron has been low    Depression    Family history of adverse reaction to anesthesia    reports son is slow to wake    GERD (gastroesophageal reflux disease)    Hypertension    PONV (postoperative nausea and vomiting)    Sleep apnea    uses CPAP occ    Thyroid  disease     Past Surgical History:  Procedure Laterality Date   CESAREAN SECTION  1997, 1999, 2004   GASTRIC ROUX-EN-Y N/A 10/23/2017   Procedure: LAPAROSCOPIC ROUX-EN-Y GASTRIC BYPASS WITH UPPER  ENDOSCOPY, ERAS Pathway;  Surgeon: Mikell Katz, MD;  Location: [facility];  Service: General;  Laterality: N/A;   TUBAL LIGATION  2004    Prior to Admission medications   Medication Sig Start Date End Date Taking? Authorizing Provider  acetaminophen  (TYLENOL ) 500 MG tablet Take 1,000 mg by mouth 2 (two) times daily as needed for moderate pain.    [provider]  albuterol (PROVENTIL HFA;VENTOLIN HFA) 108 (90 Base) MCG/ACT inhaler TAKE 2 PUFFS BY MOUTH EVERY 6 HOURS AS NEEDED FOR WHEEZE OR SHORTNESS OF BREATH 05/29/17   Hawks, Bari A, FNP  Ferrous Sulfate (IRON PO) Take by mouth.    [provider]  Multiple Vitamins-Minerals (MULTIVITAMIN WITH MINERALS) tablet Take 1 tablet by mouth daily at 3 pm.     [provider]  Vitamin D , Ergocalciferol , (DRISDOL) 50000 units CAPS capsule Take 1 capsule (50,000 Units total) by mouth every 7 (seven) days. 09/12/17   Lavell Bari LABOR, FNP    Current Facility-Administered Medications  Medication Dose Route Frequency Provider Last Rate Last Admin   0.9 %  sodium chloride  infusion (Manually program via Guardrails IV Fluids)   Intravenous Once Melvenia Motto, MD   Held at 05/29/23 1219   pantoprazole  (PROTONIX ) injection 40 mg  40 mg Intravenous Q12H Pearlean Rendall, MD   40 mg at 05/29/23 1236   Current Outpatient Medications  Medication Sig  Dispense Refill   acetaminophen  (TYLENOL ) 500 MG tablet Take 1,000 mg by mouth 2 (two) times daily as needed for moderate pain.     albuterol (PROVENTIL HFA;VENTOLIN HFA) 108 (90 Base) MCG/ACT inhaler TAKE 2 PUFFS BY MOUTH EVERY 6 HOURS AS NEEDED FOR WHEEZE OR SHORTNESS OF BREATH 8.5 Inhaler 0   Ferrous Sulfate (IRON PO) Take by mouth.     Multiple Vitamins-Minerals (MULTIVITAMIN WITH MINERALS) tablet Take 1 tablet by mouth daily at 3 pm.      Vitamin D , Ergocalciferol , (DRISDOL) 50000 units CAPS capsule Take 1 capsule (50,000 Units total) by mouth every 7 (seven) days. 12 capsule 1     Allergies as of 05/29/2023   (No Known Allergies)    Family History  Problem Relation Age of Onset   Cancer Father        throat   Hypertension Mother    COPD Mother    HIV Mother    Heart failure Maternal Grandfather    Heart failure Maternal Grandmother    Stroke Maternal Grandmother    Hypertension Maternal Grandmother    Osteoarthritis Maternal Grandmother    Diabetes Sister     Social History   Socioeconomic History   Marital status: Married    Spouse name: Not on file   Number of children: Not on file   Years of education: Not on file   Highest education level: Not on file  Occupational History   Not on file  Tobacco Use   Smoking status: Former    Current packs/day: 0.00    Average packs/day: 1 pack/day for 4.0 years (4.0 ttl pk-yrs)    Types: Cigarettes    Start date: 05/17/1991    Quit date: 05/17/1995    Years since quitting: 28.0   Smokeless tobacco: Never  Vaping Use   Vaping status: Never Used  Substance and Sexual Activity   Alcohol use: Yes    Alcohol/week: 0.0 standard drinks of alcohol    Comment: very rare   Drug use: No   Sexual activity: Yes    Birth control/protection: Surgical    Comment: tubal  Other Topics Concern   Not on file  Social History Narrative   Not on file   Social Drivers of Health   Financial Resource Strain: Low Risk  (07/09/2020)   Overall Financial Resource Strain (CARDIA)    Difficulty of Paying Living Expenses: Not very hard  Food Insecurity: No Food Insecurity (05/29/2023)   Hunger Vital Sign    Worried About Running Out of Food in the Last Year: Never true    Ran Out of Food in the Last Year: Never true  Transportation Needs: No Transportation Needs (05/29/2023)   PRAPARE - Administrator, Civil Service (Medical): No    Lack of Transportation (Non-Medical): No  Physical Activity: Sufficiently Active (07/09/2020)   Exercise Vital Sign    Days of Exercise per Week: 4 days    Minutes of Exercise per  Session: 50 min  Stress: No Stress Concern Present (07/09/2020)   [employer] of Occupational Health - Occupational Stress Questionnaire    Feeling of Stress : Only a little  Social Connections: Socially Isolated (07/09/2020)   Social Connection and Isolation Panel [NHANES]    Frequency of Communication with Friends and Family: Never    Frequency of Social Gatherings with Friends and Family: Never    Attends Religious Services: Never    Database Administrator or Organizations: No  Attends Banker Meetings: Never    Marital Status: Married  Catering Manager Violence: Not At Risk (05/29/2023)   Humiliation, Afraid, Rape, and Kick questionnaire    Fear of Current or Ex-Partner: No    Emotionally Abused: No    Physically Abused: No    Sexually Abused: No     Review of Systems   Gen: Denies any fever, chills, loss of appetite, change in weight or weight loss CV: Denies chest pain, heart palpitations, syncope, edema  Resp: Denies shortness of breath with rest, cough, wheezing, coughing up blood, and pleurisy. GI: Denies vomiting blood, jaundice, and fecal incontinence.   Denies dysphagia or odynophagia. GU : Denies urinary burning, blood in urine, urinary frequency, and urinary incontinence. MS: Denies joint pain, limitation of movement, swelling, cramps, and atrophy.  Derm: Denies rash, itching, dry skin, hives. Psych: Denies depression, anxiety, memory loss, hallucinations, and confusion. Heme: Denies bruising or bleeding Neuro:  Denies any headaches, dizziness, paresthesias, shaking  Physical Exam   Vital Signs in last 24 hours: Temp:  [98.2 F (36.8 C)-98.4 F (36.9 C)] 98.4 F (36.9 C) (01/13 1225) Pulse Rate:  [57-68] 60 (01/13 1230) Resp:  [10-23] 13 (01/13 1230) BP: (113-124)/(60-77) 119/77 (01/13 1230) SpO2:  [97 %-100 %] 97 % (01/13 1230) Weight:  [92 kg] 92 kg (01/13 0948)    General:   Alert,  Well-developed, well-nourished, pleasant and  cooperative in NAD, pale Head:  Normocephalic and atraumatic. Eyes:  Sclera clear, no icterus.    Ears:  Normal auditory acuity. Mouth:  No deformity or lesions, dentition normal. Lungs:  Clear throughout to auscultation.   No wheezes, crackles, or rhonchi. No acute distress. Heart:  S1 S2 present without murmurs Abdomen:  Soft, nontender and nondistended. No masses, hepatosplenomegaly or hernias noted. Normal bowel sounds, without guarding, and without rebound.   Rectal: deferred   Msk:  Symmetrical without gross deformities. Normal posture. Extremities:  Without  edema. Neurologic:  Alert and  oriented x4. Skin:  Intact without significant lesions or rashes. Psych:  Alert and cooperative. Normal mood and affect.  Intake/Output from previous day: No intake/output data recorded. Intake/Output this shift: No intake/output data recorded.    Labs/Studies   Recent Labs Recent Labs    05/29/23 1003  WBC 2.3*  HGB 4.3*  HCT 18.0*  PLT 344   BMET Recent Labs    05/29/23 1003  NA 135  K 3.4*  CL 108  CO2 22  GLUCOSE 97  BUN 8  CREATININE 0.56  CALCIUM 8.7*   LFT Recent Labs    05/29/23 1059  PROT 6.5  ALBUMIN 3.7  AST 16  ALT 11  ALKPHOS 62  BILITOT 0.5  BILIDIR 0.1  IBILI 0.4     Radiology/Studies DG Chest 2 View Result Date: 05/29/2023 CLINICAL DATA:  Chest pain for 3-4 days. Pain radiates into the back with swelling of the feet. EXAM: CHEST - 2 VIEW COMPARISON:  Radiographs 04/10/2017. FINDINGS: Interval significant weight loss. The heart size and mediastinal contours are stable. The lungs are clear. There is no pleural effusion or pneumothorax. Mild degenerative changes in the spine without acute osseous abnormality. IMPRESSION: No evidence of acute cardiopulmonary process. Interval significant weight loss. Electronically Signed   By: Elsie Perone M.D.   On: 05/29/2023 10:51     Assessment   Heather Hardin is a 48 y.o. year old female  with a  history of GERD, OSA, and HTN that have all resolved s/p  Roux-en-Y gastric bypass 2019, now admitted with profound symptomatic anemia without overt GI bleeding.   Notably, she avoids NSAIDs completely due to Roux-en-Y gastric bypass history. She has had excellent results with purposeful weight loss. No changes in bowel habits, dysphagia, overt GI bleeding. She does note that she has not taken supplemental vitamins in several months, and she has not taken additional iron to account for malabsorption s/p gastric bypass. No FH colon cancer or polyps. No prior colonoscopy/EGD.  Agree with transfusions, and we will plan on colonoscopy/EGD tomorrow. IF these are negative, we can pursue further work-up outpatient. Capsule study would not show all of small intestine due to anatomy. Could pursue CTE if no obvious source endoscopically.     Plan / Recommendations    Clear liquids Colonoscopy prep NPO after midnight Plans for colonoscopy/EGD on 1/14 Continue with blood transfusion Celiac serologies for completeness' sake Would benefit from outpatient Hematology evaluation for IDA     05/29/2023, 12:46 PM  Therisa MICAEL Stager, PhD, ANP-BC [facility] Gastroenterology

## 2023-05-29 NOTE — Plan of Care (Signed)
  Problem: Education: Goal: Knowledge of General Education information will improve Description: Including pain rating scale, medication(s)/side effects and non-pharmacologic comfort measures Outcome: Progressing   Problem: Health Behavior/Discharge Planning: Goal: Ability to manage health-related needs will improve Outcome: Progressing   Problem: Clinical Measurements: Goal: Ability to maintain clinical measurements within normal limits will improve Outcome: Progressing Goal: Will remain free from infection Outcome: Progressing Goal: Diagnostic test results will improve Outcome: Progressing Goal: Respiratory complications will improve Outcome: Progressing Goal: Cardiovascular complication will be avoided Outcome: Progressing   Problem: Activity: Goal: Risk for activity intolerance will decrease Outcome: Not Progressing   Problem: Nutrition: Goal: Adequate nutrition will be maintained Outcome: Not Progressing   Problem: Coping: Goal: Level of anxiety will decrease Outcome: Progressing   Problem: Elimination: Goal: Will not experience complications related to bowel motility Outcome: Progressing Goal: Will not experience complications related to urinary retention Outcome: Progressing   Problem: Pain Management: Goal: General experience of comfort will improve Outcome: Progressing   Problem: Safety: Goal: Ability to remain free from injury will improve Outcome: Progressing   Problem: Skin Integrity: Goal: Risk for impaired skin integrity will decrease Outcome: Progressing

## 2023-05-30 ENCOUNTER — Encounter (HOSPITAL_COMMUNITY): Payer: Self-pay | Admitting: Family Medicine

## 2023-05-30 ENCOUNTER — Inpatient Hospital Stay (HOSPITAL_COMMUNITY): Payer: Medicaid Other | Admitting: Certified Registered Nurse Anesthetist

## 2023-05-30 ENCOUNTER — Telehealth: Payer: Self-pay | Admitting: Gastroenterology

## 2023-05-30 ENCOUNTER — Encounter (HOSPITAL_COMMUNITY)
Admission: EM | Disposition: A | Discharge status: Home / Self Care | Payer: Self-pay | Source: Home / Self Care | Attending: Family Medicine

## 2023-05-30 DIAGNOSIS — D509 Iron deficiency anemia, unspecified: Secondary | ICD-10-CM

## 2023-05-30 DIAGNOSIS — Z9884 Bariatric surgery status: Secondary | ICD-10-CM | POA: Diagnosis not present

## 2023-05-30 DIAGNOSIS — K3189 Other diseases of stomach and duodenum: Secondary | ICD-10-CM

## 2023-05-30 DIAGNOSIS — G4730 Sleep apnea, unspecified: Secondary | ICD-10-CM | POA: Diagnosis not present

## 2023-05-30 DIAGNOSIS — E039 Hypothyroidism, unspecified: Secondary | ICD-10-CM | POA: Diagnosis not present

## 2023-05-30 DIAGNOSIS — K648 Other hemorrhoids: Secondary | ICD-10-CM

## 2023-05-30 DIAGNOSIS — D649 Anemia, unspecified: Secondary | ICD-10-CM | POA: Diagnosis not present

## 2023-05-30 HISTORY — PX: ESOPHAGOGASTRODUODENOSCOPY (EGD) WITH PROPOFOL: SHX5813

## 2023-05-30 HISTORY — PX: BIOPSY: SHX5522

## 2023-05-30 HISTORY — PX: COLONOSCOPY WITH PROPOFOL: SHX5780

## 2023-05-30 LAB — TYPE AND SCREEN
ABO/RH(D): O POS
Antibody Screen: NEGATIVE
Unit division: 0
Unit division: 0
Unit division: 0

## 2023-05-30 LAB — CBC
HCT: 26.6 % — ABNORMAL LOW (ref 36.0–46.0)
Hemoglobin: 7.4 g/dL — ABNORMAL LOW (ref 12.0–15.0)
MCH: 19.7 pg — ABNORMAL LOW (ref 26.0–34.0)
MCHC: 27.8 g/dL — ABNORMAL LOW (ref 30.0–36.0)
MCV: 70.7 fL — ABNORMAL LOW (ref 80.0–100.0)
Platelets: 266 10*3/uL (ref 150–400)
RBC: 3.76 MIL/uL — ABNORMAL LOW (ref 3.87–5.11)
RDW: 22.5 % — ABNORMAL HIGH (ref 11.5–15.5)
WBC: 3.2 10*3/uL — ABNORMAL LOW (ref 4.0–10.5)
nRBC: 0 % (ref 0.0–0.2)

## 2023-05-30 LAB — COMPREHENSIVE METABOLIC PANEL
ALT: 13 U/L (ref 0–44)
AST: 16 U/L (ref 15–41)
Albumin: 3.3 g/dL — ABNORMAL LOW (ref 3.5–5.0)
Alkaline Phosphatase: 59 U/L (ref 38–126)
Anion gap: 3 — ABNORMAL LOW (ref 5–15)
BUN: 6 mg/dL (ref 6–20)
CO2: 23 mmol/L (ref 22–32)
Calcium: 8.4 mg/dL — ABNORMAL LOW (ref 8.9–10.3)
Chloride: 110 mmol/L (ref 98–111)
Creatinine, Ser: 0.53 mg/dL (ref 0.44–1.00)
GFR, Estimated: >60 mL/min (ref >60)
Glucose, Bld: 72 mg/dL (ref 70–99)
Potassium: 3.6 mmol/L (ref 3.5–5.1)
Sodium: 136 mmol/L (ref 135–145)
Total Bilirubin: 1.2 mg/dL (ref 0.0–1.2)
Total Protein: 5.9 g/dL — ABNORMAL LOW (ref 6.5–8.1)

## 2023-05-30 LAB — BPAM RBC
Blood Product Expiration Date: 202502162359
Blood Product Expiration Date: 202502162359
Blood Product Expiration Date: 202502162359
ISSUE DATE / TIME: 202501131158
ISSUE DATE / TIME: 202501131519
ISSUE DATE / TIME: 202501131730
Unit Type and Rh: 5100
Unit Type and Rh: 5100
Unit Type and Rh: 5100

## 2023-05-30 LAB — HIV ANTIBODY (ROUTINE TESTING W REFLEX): HIV Screen 4th Generation wRfx: NONREACTIVE

## 2023-05-30 SURGERY — COLONOSCOPY WITH PROPOFOL
Anesthesia: General

## 2023-05-30 MED ORDER — LACTATED RINGERS IV SOLN: INTRAVENOUS | Status: DC | PRN

## 2023-05-30 MED ORDER — VITAMIN B-12 1000 MCG PO TABS: 1000.0000 ug | ORAL_TABLET | Freq: Every day | ORAL | 11 refills | Status: DC

## 2023-05-30 MED ORDER — PROPOFOL 500 MG/50ML IV EMUL
INTRAVENOUS | Status: DC | PRN
  Administered 2023-05-30: 150 ug/kg/min via INTRAVENOUS

## 2023-05-30 MED ORDER — PROPOFOL 10 MG/ML IV BOLUS
INTRAVENOUS | Status: DC | PRN
  Administered 2023-05-30: 50 mg via INTRAVENOUS
  Administered 2023-05-30: 100 mg via INTRAVENOUS

## 2023-05-30 MED ORDER — ADULT MULTIVITAMIN W/MINERALS CH: 1.0000 | ORAL_TABLET | Freq: Every day | ORAL | Status: AC

## 2023-05-30 MED ORDER — DEXMEDETOMIDINE HCL IN NACL 80 MCG/20ML IV SOLN
INTRAVENOUS | Status: AC
  Filled 2023-05-30: qty 20

## 2023-05-30 MED ORDER — CYANOCOBALAMIN 1000 MCG/ML IJ SOLN: 1000.0000 ug | INTRAMUSCULAR | 11 refills | Status: DC

## 2023-05-30 MED ORDER — DEXMEDETOMIDINE HCL IN NACL 80 MCG/20ML IV SOLN
INTRAVENOUS | Status: DC | PRN
  Administered 2023-05-30: 8 ug via INTRAVENOUS

## 2023-05-30 MED ORDER — FERROUS SULFATE 325 (65 FE) MG PO TABS: 325.0000 mg | ORAL_TABLET | Freq: Every day | ORAL | 5 refills | Status: AC

## 2023-05-30 MED ORDER — PANTOPRAZOLE SODIUM 40 MG PO TBEC: 40.0000 mg | DELAYED_RELEASE_TABLET | Freq: Every day | ORAL | 1 refills | Status: DC

## 2023-05-30 MED ORDER — LIDOCAINE HCL (PF) 2 % IJ SOLN
INTRAMUSCULAR | Status: DC | PRN
  Administered 2023-05-30: 100 mg via INTRADERMAL

## 2023-05-30 MED ORDER — SODIUM CHLORIDE 0.9 % IV SOLN
INTRAVENOUS | Status: DC
Start: 2023-05-30 — End: 2023-05-30

## 2023-05-30 NOTE — Telephone Encounter (Signed)
 Anticipate patient will be discharged from the hospital today. Admitted with severe symptomatic IDA.   Mindy/Tammy: Patient needs repeat colonoscopy in 2-3 weeks due to poor prep today. Please arrange colonoscopy with Dr. Cindie. Dx: IDA ASA 3/4 No oral iron for 7 days prior to colonoscopy.   She will need a small volume prep and will also need Linzess 145 mcg daily for 3 days prior to starting her colon prep. I will also send in a prescription of Zofran  for her to use as needed for nausea while completing her colon prep.   We will need to get her a follow-up appointment scheduled within a couple of weeks after her colonoscopy. Can you please arrange this after the timing of her colonoscopy is determined.   Please also refer patient to hematology for IV iron.  Dx: IDA, history of gastric bypass.   Courtney: Patient will need repeat CBC in 1 week.  Please arrange this and contact the patient to let her know the details of when and where to have this done.

## 2023-05-30 NOTE — Anesthesia Preprocedure Evaluation (Addendum)
 Anesthesia Evaluation  Patient identified by MRN, date of birth, ID band Patient awake  General Assessment Comment:Family history of adverse reaction to anesthesia  reports son is slow to wake    Reviewed: Allergy & Precautions, H&P , NPO status , Patient's Chart, lab work & pertinent test results, reviewed documented beta blocker date and time   History of Anesthesia Complications (+) history of anesthetic complications  Airway Mallampati: III  TM Distance: >3 FB Neck ROM: full    Dental  (+) Dental Advisory Given, Edentulous Upper   Pulmonary sleep apnea and Continuous Positive Airway Pressure Ventilation , former smoker   Pulmonary exam normal breath sounds clear to auscultation       Cardiovascular Exercise Tolerance: Good hypertension, Pt. on medications Normal cardiovascular exam Rhythm:regular Rate:Normal     Neuro/Psych  PSYCHIATRIC DISORDERS Anxiety Depression    negative neurological ROS     GI/Hepatic Neg liver ROS,GERD  Controlled,,S/p gastric bypass surgery   Endo/Other  Hypothyroidism  Class 3 obesity  Renal/GU      Musculoskeletal   Abdominal   Peds  Hematology  (+) Blood dyscrasia, anemia   Anesthesia Other Findings Severe anemia Hgb 7.4  Reproductive/Obstetrics                             Anesthesia Physical Anesthesia Plan  ASA: 4  Anesthesia Plan: General   Post-op Pain Management: Minimal or no pain anticipated   Induction: Intravenous  PONV Risk Score and Plan: Propofol  infusion  Airway Management Planned: Nasal Cannula and Natural Airway  Additional Equipment: None  Intra-op Plan:   Post-operative Plan:   Informed Consent: I have reviewed the patients History and Physical, chart, labs and discussed the procedure including the risks, benefits and alternatives for the proposed anesthesia with the patient or authorized representative who has indicated  his/her understanding and acceptance.     Dental Advisory Given  Plan Discussed with: CRNA  Anesthesia Plan Comments: (  )        Anesthesia Quick Evaluation

## 2023-05-30 NOTE — Progress Notes (Signed)
 We will proceed with EGD and colonoscopy as scheduled.  I thoroughly discussed with the patient the procedure, including the risks involved. Patient understands what the procedure involves including the benefits and any risks. Patient understands alternatives to the proposed procedure. Risks including (but not limited to) bleeding, tearing of the lining (perforation), rupture of adjacent organs, problems with heart and lung function, infection, and medication reactions. A small percentage of complications may require surgery, hospitalization, repeat endoscopic procedure, and/or transfusion.  Patient understood and agreed.  Katrinka Blazing, MD Gastroenterology and Hepatology [facility] Gastroenterology

## 2023-05-30 NOTE — Op Note (Signed)
 [facility] Gastroenterology Patient Name: Heather Hardin Procedure Date: 05/30/2023 11:23 AM MRN: [id number] Date of Birth: 05-25-1975 Attending MD: Toribio Fortune , , [id number] CSN: [id number] Age: 48 Admit Type: Outpatient Procedure:                Upper GI endoscopy Indications:              Iron deficiency anemia Providers:                Toribio Fortune, Crystal Page, Kristine L. Shirlean Balm, Technician Referring MD:              Medicines:                Monitored Anesthesia Care Complications:            No immediate complications. Estimated Blood Loss:     Estimated blood loss: none. Procedure:                Pre-Anesthesia Assessment:                           - Prior to the procedure, a History and Physical                            was performed, and patient medications, allergies                            and sensitivities were reviewed. The patient's                            tolerance of previous anesthesia was reviewed.                           - The risks and benefits of the procedure and the                            sedation options and risks were discussed with the                            patient. All questions were answered and informed                            consent was obtained.                           - ASA Grade Assessment: I - A normal, healthy                            patient.                           After obtaining informed consent, the endoscope was                            passed under direct vision. Throughout the  procedure, the patient's blood pressure, pulse, and                            oxygen saturations were monitored continuously. The                            [id number]-H190 ([id number]) scope was introduced through the                            mouth, and advanced to the efferent jejunal loop.                            The upper GI endoscopy was accomplished without                             difficulty. The patient tolerated the procedure                            well. Scope In: 11:33:14 AM Scope Out: 11:37:47 AM Total Procedure Duration: 0 hours 4 minutes 33 seconds  Findings:      The examined esophagus was normal.      Evidence of a gastric bypass was found. A gastric pouch with a 6 cm       length from the GE junction to the gastrojejunal anastomosis was found.       The staple line appeared intact. The gastrojejunal anastomosis was       characterized by healthy appearing mucosa. This was traversed. This was       biopsied with a cold forceps for microbiology.      The examined jejunum was normal (afferent and efferent limbs). Impression:               - Normal esophagus.                           - Gastric bypass with a pouch 6 cm in length and                            intact staple line. Gastrojejunal anastomosis                            characterized by healthy appearing mucosa. Biopsied.                           - Normal examined jejunum. Moderate Sedation:      Per Anesthesia Care Recommendation:           - Await pathology results.                           - Proceed with colonoscopy. Procedure Code(s):        --- Professional ---                           [phone], Esophagogastroduodenoscopy, flexible,  transoral; with biopsy, single or multiple Diagnosis Code(s):        --- Professional ---                           Z98.84, Bariatric surgery status                           D50.9, Iron deficiency anemia, unspecified CPT copyright 2022 American Medical Association. All rights reserved. The codes documented in this report are preliminary and upon coder review may  be revised to meet current compliance requirements. Toribio Fortune, MD Toribio Fortune,  05/30/2023 11:42:45 AM This report has been signed electronically. Number of Addenda: 0

## 2023-05-30 NOTE — Transfer of Care (Signed)
 Immediate Anesthesia Transfer of Care Note  Patient: [name]  Procedure(s) Performed: COLONOSCOPY WITH PROPOFOL  ESOPHAGOGASTRODUODENOSCOPY (EGD) WITH PROPOFOL  BIOPSY  Patient Location: PACU  Anesthesia Type:General  Level of Consciousness: awake, alert , and oriented  Airway & Oxygen Therapy: Patient Spontanous Breathing  Post-op Assessment: Report given to RN and Post -op Vital signs reviewed and stable  Post vital signs: Reviewed and stable  Last Vitals:  Vitals Value Taken Time  BP 100/65 05/30/23 1200  Temp 36.6 C 05/30/23 1200  Pulse 60 05/30/23 1200  Resp 17 05/30/23 1200  SpO2 100 % 05/30/23 1200  Vitals shown include unfiled device data.  Last Pain:  Vitals:   05/30/23 1128  TempSrc:   PainSc: 0-No pain      Patients Stated Pain Goal: 6 (05/30/23 1108)  Complications: No notable events documented.

## 2023-05-30 NOTE — Progress Notes (Signed)
 Transition of Care Department [facility]) has reviewed patient and no other TOC needs have been identified at this time. We will continue to monitor patient advancement through interdisciplinary progression rounds. If new patient transition needs arise, please place a TOC consult.   05/30/23 0759  TOC Brief Assessment  Insurance and Status Reviewed  Patient has primary care physician Yes  Home environment has been reviewed Lives with husband and adult children.  Prior level of function: Independent.  Prior/Current Home Services No current home services  Social Drivers of Health Review SDOH reviewed no interventions necessary  Readmission risk has been reviewed Yes  Transition of care needs no transition of care needs at this time

## 2023-05-30 NOTE — Anesthesia Postprocedure Evaluation (Signed)
 Anesthesia Post Note  Patient: Heather Hardin  Procedure(s) Performed: COLONOSCOPY WITH PROPOFOL  ESOPHAGOGASTRODUODENOSCOPY (EGD) WITH PROPOFOL  BIOPSY  Patient location during evaluation: PACU Anesthesia Type: General Level of consciousness: awake and alert Pain management: pain level controlled Vital Signs Assessment: post-procedure vital signs reviewed and stable Respiratory status: spontaneous breathing, nonlabored ventilation, respiratory function stable and patient connected to nasal cannula oxygen Cardiovascular status: blood pressure returned to baseline and stable Postop Assessment: no apparent nausea or vomiting Anesthetic complications: no   There were no known notable events for this encounter.   Last Vitals:  Vitals:   05/30/23 1215 05/30/23 1300  BP: 105/66 121/67  Pulse: (!) 50 60  Resp: 15 10  Temp:    SpO2: 98% 100%    Last Pain:  Vitals:   05/30/23 1215  TempSrc:   PainSc: 0-No pain                 Kimara Bencomo L Spencer Cardinal

## 2023-05-30 NOTE — Op Note (Signed)
 [facility] Patient Name: Heather Hardin Procedure Date: 05/30/2023 11:40 AM MRN: [id number] Date of Birth: January 07, 1976 Attending MD: Toribio Fortune , , [id number] CSN: [id number] Age: 48 Admit Type: Outpatient Procedure:                Colonoscopy Indications:              Iron deficiency anemia Providers:                Toribio Fortune, Crystal Page, Kristine L. Shirlean Balm, Technician Referring MD:              Medicines:                Monitored Anesthesia Care Complications:            No immediate complications. Estimated Blood Loss:     Estimated blood loss: none. Procedure:                Pre-Anesthesia Assessment:                           - Prior to the procedure, a History and Physical                            was performed, and patient medications, allergies                            and sensitivities were reviewed. The patient's                            tolerance of previous anesthesia was reviewed.                           - The risks and benefits of the procedure and the                            sedation options and risks were discussed with the                            patient. All questions were answered and informed                            consent was obtained.                           - ASA Grade Assessment: I - A normal, healthy                            patient.                           After obtaining informed consent, the colonoscope                            was passed under direct vision. Throughout the  procedure, the patient's blood pressure, pulse, and                            oxygen saturations were monitored continuously. The                            [id number]-HQ190L ([id number]) scope was introduced through                            the anus and advanced to the the cecum, identified                            by appendiceal orifice and ileocecal valve. The                             colonoscopy was performed without difficulty. The                            patient tolerated the procedure well. The quality                            of the bowel preparation was poor. Scope In: 11:43:36 AM Scope Out: 11:55:13 AM Scope Withdrawal Time: 0 hours 6 minutes 11 seconds  Total Procedure Duration: 0 hours 11 minutes 37 seconds  Findings:      The perianal and digital rectal examinations were normal.      Extensive amounts of semi-solid stool was found in the entire colon,       precluding adequate visualization. I attempted to do a thorough       evaluation of the colon as thorough as possible but given bowel prep       there were several areas that could not be adequately inspected.      Non-bleeding internal hemorrhoids were found during retroflexion. The       hemorrhoids were small. Impression:               - Preparation of the colon was poor.                           - Stool in the entire examined colon.                           - Non-bleeding internal hemorrhoids.                           - No specimens collected. Moderate Sedation:      Per Anesthesia Care Recommendation:           - Return patient to hospital ward for ongoing care.                            OK to discharge from GI perspective.                           - Resume previous diet.                           -  Start ferrous sulfate 325 mg daily.                           - Schedule repeat outpatient colonoscopy with                            adequate bowel prep within next 2-3 weeks. Procedure Code(s):        --- Professional ---                           [id number], Colonoscopy, flexible; diagnostic, including                            collection of specimen(s) by brushing or washing,                            when performed (separate procedure) Diagnosis Code(s):        --- Professional ---                           D50.9, Iron deficiency anemia, unspecified CPT copyright 2022 American Medical  Association. All rights reserved. The codes documented in this report are preliminary and upon coder review may  be revised to meet current compliance requirements. Toribio Fortune, MD Toribio Fortune,  05/30/2023 12:04:06 PM This report has been signed electronically. Number of Addenda: 0

## 2023-05-30 NOTE — Discharge Summary (Signed)
 Heather Hardin, is a 48 y.o. female  DOB [birth year]  MRN [id number].  Admission date:  05/29/2023  Admitting Physician  Rendall Carwin, MD  Discharge Date:  05/30/2023   Primary MD  Lavell Bari LABOR, FNP  Recommendations for primary care physician for things to follow:   1)Avoid ibuprofen/Advil/Aleve/Motrin/Goody Powders/Naproxen/BC powders/Meloxicam/Diclofenac/Indomethacin and other Nonsteroidal anti-inflammatory medications as these will make you more likely to bleed and can cause stomach ulcers, can also cause Kidney problems.   2)Recommend B12 intramuscular injections once monthly  3)Follow up with Gastroenterologist Dr. Gerda repeat colonoscopy in 2 to 3 weeks address [address], [address], [place] [address],,[place] Number [phone]     4)Repeat CBC within 1 week  5)Vitamin and iron supplements as advised  Admission Diagnosis  Symptomatic anemia [D64.9]   Discharge Diagnosis  Symptomatic anemia [D64.9]   Principal Problem:   Symptomatic anemia      Past Medical History:  Diagnosis Date   Anemia    in youth ; sometimes iron has been low    Depression    Family history of adverse reaction to anesthesia    reports son is slow to wake    GERD (gastroesophageal reflux disease)    Hypertension    PONV (postoperative nausea and vomiting)    Sleep apnea    uses CPAP occ    Thyroid  disease     Past Surgical History:  Procedure Laterality Date   CESAREAN SECTION  1997, 1999, 2004   GASTRIC ROUX-EN-Y N/A 10/23/2017   Procedure: LAPAROSCOPIC ROUX-EN-Y GASTRIC BYPASS WITH UPPER ENDOSCOPY, ERAS Pathway;  Surgeon: Mikell Katz, MD;  Location: [facility];  Service: General;  Laterality: N/A;   TUBAL LIGATION  2004     HPI  from the history and physical done on the day of admission:    Heather Hardin  is a 48 y.o. 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 which led to significant weight loss and resolution of her HTN and OSA presents to the ED with complaints of chest discomfort , generalized weakness, fatigue and dyspnea on exertion with leg swelling--symptoms of worsening over the last 5 days or so -No pleuritic symptoms -Denies emesis ,denies melena -No headaches -Denies NSAID use -Menstrual cycles only occur about once every 3 to 4 months or so and they are usually at times heavy -She is found to have hemoglobin of 4.3 in the ED with hematocrit of 18 -Ferritin is only 1, serum iron is 9 iron saturation is 2 -Chest x-ray without acute findings and troponin is negative -In the ED stool occult blood and pregnancy test negative -Chest x-ray without acute findings -Troponin is less than 2 -LFTs are not elevated -Potassium is 3.4 with a creatinine of 0.56 -Borderline low B12 at 202 folate is not low -UA is unremarkable -EKG sinus rhythm   Hospital Course:   1) acute symptomatic anemia----hemoglobin is 4.3, no recent hemoglobin available by 12 between 12 and 13 back in 2019 --Denies NSAID use -Menstrual  cycles only occur about once every 3 to 4 months or so and they are usually at times heavy -She is found to have hemoglobin of 4.3 in the ED with hematocrit of 18 -Ferritin is only 1, serum iron is 9 iron saturation is 2 -Stool is Hemoccult negative -Hgb is up to 7.5 from 4.3 after transfusion of 3 units of PRBC -Continue Protonix  -GI consult appreciated- -patient underwent EGD and colonoscopy planned for 05/30/2023 (Capsule study would not show all of small intestine due to anatomy. Could pursue CTE if no obvious source endoscopically. 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 Schedule an appointment as soon as possible for a visit in 2 week(s).   Specialty: Gastroenterology Why: Repeat CBC within a week and repeat colonoscopy in 2 to 3 weeks Contact information: [address] [place] [address] [phone]                 Consults obtained -GI  Diet and Activity recommendation:  As advised  Discharge Instructions    Discharge Instructions     Call MD for:  difficulty breathing, headache or visual disturbances   Complete by: As directed    Call MD for:  persistant dizziness or light-headedness   Complete by: As directed    Call MD for:  persistant nausea and vomiting   Complete by: As directed    Call MD for:  temperature >100.4   Complete by: As directed    Diet - low sodium heart healthy   Complete by: As directed    Discharge instructions   Complete by: As directed    1)Avoid ibuprofen/Advil/Aleve/Motrin/Goody Powders/Naproxen/BC powders/Meloxicam/Diclofenac/Indomethacin and other Nonsteroidal anti-inflammatory medications as these will make you more likely to bleed and can cause stomach ulcers, can also cause Kidney problems.   2)Recommend B12 intramuscular injections once monthly  3)Follow up with Gastroenterologist Dr. Gerda repeat colonoscopy in 2 to 3 weeks address [address], [place] [address],,[place] Number [phone]     4)Repeat CBC within 1 week  5)Vitamin and iron supplements as advised   Increase activity slowly   Complete by: As directed          Discharge Medications      Allergies as of 05/30/2023   No Known Allergies      Medication List     STOP taking these medications    acetaminophen  500 MG tablet Commonly known as: TYLENOL        TAKE these medications    cyanocobalamin  1000 MCG tablet Commonly known as: VITAMIN B12 Take 1 tablet (1,000 mcg total) by mouth daily.   cyanocobalamin  1000 MCG/ML injection Commonly known as: VITAMIN B12 Inject 1 mL (1,000 mcg total) into the muscle every 30 (thirty) days. Start taking on: June 30, 2023   ferrous sulfate 325 (65 FE) MG tablet Take 1 tablet (325 mg total) by  mouth daily with breakfast. Start taking on: June 01, 2023   multivitamin with minerals Tabs tablet Take 1 tablet by mouth daily. Start taking on: May 31, 2023   pantoprazole  40 MG tablet Commonly known as: Protonix  Take 1 tablet (40 mg total) by mouth daily.   simethicone  80 MG chewable tablet Commonly known as: MYLICON Chew 80 mg by mouth every 6 (six) hours as needed for flatulence.        Major procedures and Radiology Reports - PLEASE review detailed and final reports for all details, in brief -    DG Chest 2 View Result Date: 05/29/2023 CLINICAL DATA:  Chest pain for 3-4 days. Pain radiates into the back with swelling of the feet. EXAM: CHEST - 2 VIEW COMPARISON:  Radiographs 04/10/2017. FINDINGS: Interval significant weight loss. The heart size and mediastinal contours are stable. The lungs are clear. There is no pleural effusion or pneumothorax. Mild degenerative changes in the spine without acute osseous abnormality. IMPRESSION: No evidence of acute cardiopulmonary process. Interval significant weight loss. Electronically Signed   By: Elsie Perone M.D.   On: 05/29/2023 10:51   Micro Results   Recent Results (from the past 240 hours)  MRSA Next Gen by PCR, Nasal     Status: None   Collection Time: 05/29/23  3:15 PM   Specimen: Nasal Mucosa; Nasal Swab  Result Value Ref Range Status   MRSA by PCR  Next Gen NOT DETECTED NOT DETECTED Final    Comment: (NOTE) The GeneXpert MRSA Assay (FDA approved for NASAL specimens only), is one component of a comprehensive MRSA colonization surveillance program. It is not intended to diagnose MRSA infection nor to guide or monitor treatment for MRSA infections. Test performance is not FDA approved in patients less than 63 years old. Performed at [facility], [address], [place], [place] [address]     Today   Subjective    Heather Hardin today has no new complaints  No fever  Or chills   No Nausea, Vomiting or Diarrhea -Tolerated EGD and colonoscopy well -Eating well after procedure      Patient has been seen and examined prior to discharge   Objective   Blood pressure 121/67, pulse 60, temperature 98.4 F (36.9 C), temperature source Oral, resp. rate 10, height 5' 7 (1.702 m), weight 73.3 kg, SpO2 100%.   Intake/Output Summary (Last 24 hours) at 05/30/2023 1743 Last data filed at 05/30/2023 1157 Gross per 24 hour  Intake 718.33 ml  Output --  Net 718.33 ml    Exam Gen:- Awake Alert, no acute distress  HEENT:- [place].AT, No sclera icterus Neck-Supple Neck,No JVD,.  Lungs-  CTAB , good air movement bilaterally CV- S1, S2 normal, regular Abd-  +ve B.Sounds, Abd Soft, No tenderness,    Extremity/Skin:- No  edema,   good pulses Psych-affect is appropriate, oriented x3 Neuro-no new focal deficits, no tremors    Data Review   CBC w Diff:  Lab Results  Component Value Date   WBC 3.2 (L) 05/30/2023   HGB 7.4 (L) 05/30/2023   HGB 14.3 09/28/2016   HCT 26.6 (L) 05/30/2023   HCT 44.4 09/28/2016   PLT 266 05/30/2023   PLT 316 09/28/2016   LYMPHOPCT 36 05/29/2023   MONOPCT 8 05/29/2023   EOSPCT 2 05/29/2023   BASOPCT 1 05/29/2023   CMP:  Lab Results  Component Value Date   NA 136 05/30/2023   NA 141 09/11/2017   K 3.6 05/30/2023  CL 110 05/30/2023   CO2 23 05/30/2023   BUN 6 05/30/2023   BUN 9 09/11/2017    CREATININE 0.53 05/30/2023   PROT 5.9 (L) 05/30/2023   PROT 7.3 09/11/2017   ALBUMIN 3.3 (L) 05/30/2023   ALBUMIN 4.3 09/11/2017   BILITOT 1.2 05/30/2023   BILITOT 0.2 09/11/2017   ALKPHOS 59 05/30/2023   AST 16 05/30/2023   ALT 13 05/30/2023   Total Discharge time is about 33 minutes  Rendall Carwin M.D on 05/30/2023 at 5:43 PM  Go to [email or web address] -  for contact info  [facility] - Office  [phone]

## 2023-05-30 NOTE — Progress Notes (Signed)
 Patient underwent EGD and colonoscopy today. See procedure notes for details. EGD without any significant abnormalities.  Colonoscopy incomplete due to poor prep.  Recommended outpatient colonoscopy in the next 2 to 3 weeks.  Spoke with patient today regarding her colon prep.  Patient states she was only able to drink about 3 glasses of this as it was making her gag.  She does not have any chronic issues with nausea or vomiting.  Also denies any chronic issues with constipation as long as she eats leafy greens.  She feels that if she had a smaller volume to drink, she would be able to get through this easier.    We will plan to pursue colonoscopy with small volume prep.  I will also give her Linzess 145 mcg daily for 3 days prior to her colon prep to ensure that she is prepped adequately. Regarding her nausea/gagging, I will send a prescription of Zofran  for her to use as needed for nausea while completing the colon prep.   We will also arrange CBC in 1 week to ensure hemoglobin remains stable and plan for an office visit after her colonoscopy.  Our office will also place a referral to hematology for IV iron due to her history of gastric bypass.  Josette Centers, PA-C [facility]enter[facility] 05/30/2023

## 2023-05-30 NOTE — Brief Op Note (Addendum)
 05/29/2023 - 05/30/2023  11:41 AM  PATIENT:  Heather Hardin  48 y.o. female  PRE-OPERATIVE DIAGNOSIS:  severe IDA  POST-OPERATIVE DIAGNOSIS:  EGD: gastric bypass pouch 6cm; gastric biopsies COLON:  PROCEDURE:  Procedure(s): COLONOSCOPY WITH PROPOFOL  (N/A) ESOPHAGOGASTRODUODENOSCOPY (EGD) WITH PROPOFOL  (N/A) BIOPSY  SURGEON:  Surgeons and Role:    * Eartha Angelia Sieving, MD - Primary  Patient underwent EGD and colonoscopy under propofol  sedation.  Tolerated the procedure adequately.   EGD FINDINGS: - Normal esophagus.  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  Gastrojejunal anastomosis characterized by healthy appearing mucosa.  Biopsied.  - Normal examined jejunum.   COLONOSCOPY FINDINGS: - Preparation of the colon was poor.  - Stool in the entire examined colon.  I attempted to do a thorough evaluation of the colon as thorough as possible but given bowel prep there were several areas that could not be adequately inspected. - Non-bleeding internal hemorrhoids.   RECOMMENDATIONS - Return patient to hospital ward for ongoing care. OK to discharge from GI perspective.  - Resume previous diet. - Start ferrous sulfate 325 mg daily. - Schedule repeat outpatient colonoscopy with adequate bowel prep within next 2-3 weeks. - GI service will sign-off, please call us  back if you have any more questions.  Sieving Eartha, MD Gastroenterology and Hepatology [facility]

## 2023-05-30 NOTE — Anesthesia Procedure Notes (Signed)
 Date/Time: 05/30/2023 11:28 AM  Performed by: Eliodoro Deward FALCON, CRNAPre-anesthesia Checklist: Patient identified, Emergency Drugs available, Suction available and Patient being monitored Patient Re-evaluated:Patient Re-evaluated prior to induction Oxygen Delivery Method: Nasal cannula Induction Type: IV induction Placement Confirmation: positive ETCO2 Comments: Optiflow High Flow [place] O2 used.

## 2023-05-30 NOTE — Discharge Instructions (Signed)
 1)Avoid ibuprofen/Advil/Aleve/Motrin/Goody Powders/Naproxen/BC powders/Meloxicam/Diclofenac/Indomethacin and other Nonsteroidal anti-inflammatory medications as these will make you more likely to bleed and can cause stomach ulcers, can also cause Kidney problems.   2)Recommend B12 intramuscular injections once monthly  3)Follow up with Gastroenterologist Dr. Gerda repeat colonoscopy in 2 to 3 weeks address [address], [place] [address],,[place] Number [phone]     4)Repeat CBC within 1 week  5)Vitamin and iron supplements as advised

## 2023-05-30 NOTE — Plan of Care (Signed)

## 2023-05-31 ENCOUNTER — Encounter (INDEPENDENT_AMBULATORY_CARE_PROVIDER_SITE_OTHER): Payer: Self-pay | Admitting: *Deleted

## 2023-05-31 LAB — IGA: IgA: 229 mg/dL (ref 87–352)

## 2023-05-31 LAB — SURGICAL PATHOLOGY

## 2023-05-31 NOTE — Addendum Note (Signed)
 Addended by: Feliz Hosteller on: 05/31/2023 09:26 AM   Modules accepted: Orders

## 2023-05-31 NOTE — Telephone Encounter (Signed)
 Patient was assigned to Dr. Mordechai April.  The discharge summary was written by the hospitalist so he may not be aware of who the patient follows with as primary gastroenterologist.  However, if the patient is requesting need to do the procedure and follow-up with me in the clinic, I am okay with that.  Please check with the patient.

## 2023-05-31 NOTE — Telephone Encounter (Signed)
 I let patient know yesterday that Dr. Mordechai April would be the one that does her colonoscopy, she was fine with this.

## 2023-05-31 NOTE — Telephone Encounter (Signed)
 Thanks, Mindy please schedule with Dr. Mordechai April. Thanks

## 2023-05-31 NOTE — Telephone Encounter (Signed)
 LMOVM to call back. Referral also sent

## 2023-05-31 NOTE — Telephone Encounter (Signed)
 Per d/c summary patient needs to schedule procedure with Dr. Sammi Crick. Please advise thanks!

## 2023-06-01 ENCOUNTER — Encounter (HOSPITAL_COMMUNITY): Payer: Self-pay | Admitting: Gastroenterology

## 2023-06-01 NOTE — Telephone Encounter (Signed)
Called home # and not working. Called mobile, LMOVM to call back

## 2023-06-02 LAB — TISSUE TRANSGLUTAMINASE, IGA: Tissue Transglutaminase Ab, IgA: <2 U/mL (ref 0–3)

## 2023-06-02 NOTE — Telephone Encounter (Signed)
LMOM for pt to call office  

## 2023-06-06 NOTE — Telephone Encounter (Signed)
Spoke with pt and she has been scheduled for procedure 2/17 with Dr. Marletta Lor. She will come by and pick up instructions, pre-op appt, prep sample and linzess. Also scheduled appt for HFU with Dr. Marletta Lor. Already called for hem appt. Aware she needs to stop oral iron 7 days prior to procedure.

## 2023-06-07 ENCOUNTER — Encounter: Payer: Self-pay | Admitting: *Deleted

## 2023-06-07 ENCOUNTER — Other Ambulatory Visit: Payer: Self-pay | Admitting: *Deleted

## 2023-06-07 DIAGNOSIS — D509 Iron deficiency anemia, unspecified: Secondary | ICD-10-CM

## 2023-06-07 DIAGNOSIS — Z9884 Bariatric surgery status: Secondary | ICD-10-CM

## 2023-06-07 DIAGNOSIS — R0609 Other forms of dyspnea: Secondary | ICD-10-CM

## 2023-06-08 ENCOUNTER — Inpatient Hospital Stay: Payer: Medicaid Other

## 2023-06-08 ENCOUNTER — Encounter: Payer: Self-pay | Admitting: *Deleted

## 2023-06-08 ENCOUNTER — Inpatient Hospital Stay: Payer: Medicaid Other | Admitting: Oncology

## 2023-06-08 NOTE — Telephone Encounter (Signed)
I have tried several times to reach pt. I put lab requisitions in pre op instructions and prep.  Also sent a MyChart message.

## 2023-06-08 NOTE — Telephone Encounter (Signed)
Spoke to pt, informed her to have labs done as soon as possible. Pt voiced understanding.

## 2023-06-12 ENCOUNTER — Other Ambulatory Visit (HOSPITAL_COMMUNITY)
Admission: RE | Admit: 2023-06-12 | Discharge: 2023-06-12 | Disposition: A | Discharge status: Home / Self Care | Payer: Medicaid Other | Source: Ambulatory Visit | Attending: Gastroenterology | Admitting: Gastroenterology

## 2023-06-12 DIAGNOSIS — D509 Iron deficiency anemia, unspecified: Secondary | ICD-10-CM | POA: Diagnosis not present

## 2023-06-12 LAB — CBC WITH DIFFERENTIAL/PLATELET
Abs Immature Granulocytes: 0.01 10*3/uL (ref 0.00–0.07)
Basophils Absolute: 0.1 10*3/uL (ref 0.0–0.1)
Basophils Relative: 1 %
Eosinophils Absolute: 0.1 10*3/uL (ref 0.0–0.5)
Eosinophils Relative: 1 %
HCT: 29.2 % — ABNORMAL LOW (ref 36.0–46.0)
Hemoglobin: 8.1 g/dL — ABNORMAL LOW (ref 12.0–15.0)
Immature Granulocytes: 0 %
Lymphocytes Relative: 25 %
Lymphs Abs: 1.3 10*3/uL (ref 0.7–4.0)
MCH: 20.9 pg — ABNORMAL LOW (ref 26.0–34.0)
MCHC: 27.7 g/dL — ABNORMAL LOW (ref 30.0–36.0)
MCV: 75.3 fL — ABNORMAL LOW (ref 80.0–100.0)
Monocytes Absolute: 0.5 10*3/uL (ref 0.1–1.0)
Monocytes Relative: 9 %
Neutro Abs: 3.2 10*3/uL (ref 1.7–7.7)
Neutrophils Relative %: 64 %
Platelets: 425 10*3/uL — ABNORMAL HIGH (ref 150–400)
RBC: 3.88 MIL/uL (ref 3.87–5.11)
RDW: 26.2 % — ABNORMAL HIGH (ref 11.5–15.5)
WBC: 5.1 10*3/uL (ref 4.0–10.5)
nRBC: 0 % (ref 0.0–0.2)

## 2023-06-14 ENCOUNTER — Encounter: Payer: Self-pay | Admitting: *Deleted

## 2023-06-17 DIAGNOSIS — Z419 Encounter for procedure for purposes other than remedying health state, unspecified: Secondary | ICD-10-CM | POA: Diagnosis not present

## 2023-06-20 ENCOUNTER — Encounter: Payer: Self-pay | Admitting: Oncology

## 2023-06-20 ENCOUNTER — Inpatient Hospital Stay: Payer: Medicaid Other | Attending: Oncology | Admitting: Oncology

## 2023-06-20 ENCOUNTER — Inpatient Hospital Stay: Payer: Medicaid Other

## 2023-06-20 VITALS — BP 106/69 | HR 58 | Temp 97.3°F | Resp 18 | Ht 66.93 in | Wt 161.4 lb

## 2023-06-20 DIAGNOSIS — Z9884 Bariatric surgery status: Secondary | ICD-10-CM | POA: Diagnosis not present

## 2023-06-20 DIAGNOSIS — D500 Iron deficiency anemia secondary to blood loss (chronic): Secondary | ICD-10-CM

## 2023-06-20 DIAGNOSIS — Z79899 Other long term (current) drug therapy: Secondary | ICD-10-CM | POA: Insufficient documentation

## 2023-06-20 DIAGNOSIS — E538 Deficiency of other specified B group vitamins: Secondary | ICD-10-CM

## 2023-06-20 DIAGNOSIS — N921 Excessive and frequent menstruation with irregular cycle: Secondary | ICD-10-CM | POA: Insufficient documentation

## 2023-06-20 DIAGNOSIS — D509 Iron deficiency anemia, unspecified: Secondary | ICD-10-CM | POA: Diagnosis not present

## 2023-06-20 MED ORDER — CYANOCOBALAMIN 1000 MCG/ML IJ SOLN: 1000.0000 ug | INTRAMUSCULAR | 4 refills | Status: DC

## 2023-06-20 NOTE — Assessment & Plan Note (Signed)
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 -Increase B12 injections to weekly for 4 weeks, then monthly thereafter. -Continue oral B12 supplementation.

## 2023-06-20 NOTE — Progress Notes (Signed)
 [facility] at [facility] HEMATOLOGY NEW VISIT  Lavell Bari LABOR, FNP  REASON FOR REFERRAL: Iron deficiency anemia  HISTORY OF PRESENT ILLNESS: Heather Hardin 48 y.o. female referred for iron deficiency anemia.  She is accompanied by her husband today.  Patient was recently admitted to the hospital on 05/28/2022 for chest pain and was noticed to have low hemoglobin and received 3 units of packed red blood cells.  Labs done at that time showed severely low ferritin level and also low vitamin B12 levels.  Patient started taking oral iron supplementation B12 supplementation.  Patient also has a history of gastric bypass done in 2019 and has lost significant weight since then.  Patient denies any recent weight loss, loss of appetite, abdominal pain, blood in stools or melena.  The patient's menstrual cycles have been irregular, with periods occurring every few months. When she does occur, the patient describes them as heavy, with the presence of blood clots. The patient has recently started a menstrual cycle after a gap of four months.  The patient also reports experiencing fatigue and shortness of breath, symptoms consistent with anemia.  Patient had endoscopy and colonoscopy done during this hospital stay, endoscopy revealed no active bleeding.  Colonoscopy had poor prep and is supposed to repeat soon.  Patient is a known alcoholic, does not smoke.  History of head and neck cancer in father who was a smoker.  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  I have reviewed the past medical history, past surgical history, social history and family history with the patient   ALLERGIES:  has no known allergies.  MEDICATIONS:  Current Outpatient Medications  Medication Sig Dispense Refill   cyanocobalamin  (VITAMIN B12) 1000 MCG tablet Take 1 tablet (1,000 mcg total) by mouth daily. 30 tablet 11   ferrous sulfate 325 (65 FE) MG tablet Take 1 tablet (325 mg total) by mouth  daily with breakfast. 30 tablet 5   Multiple Vitamin (MULTIVITAMIN WITH MINERALS) TABS tablet Take 1 tablet by mouth daily.     pantoprazole  (PROTONIX ) 40 MG tablet Take 1 tablet (40 mg total) by mouth daily. 30 tablet 1   simethicone  (MYLICON) 80 MG chewable tablet Chew 80 mg by mouth every 6 (six) hours as needed for flatulence.     [START ON 06/30/2023] cyanocobalamin  (VITAMIN B12) 1000 MCG/ML injection Inject 1 mL (1,000 mcg total) into the muscle once a week for 4 doses. 1 mL 4   No current facility-administered medications for this visit.     REVIEW OF SYSTEMS:   Constitutional: Denies fevers, chills or night sweats Eyes: Denies blurriness of vision Ears, nose, mouth, throat, and face: Denies mucositis or sore throat Respiratory: Denies cough, dyspnea or wheezes Cardiovascular: Denies palpitation, chest discomfort or lower extremity swelling Gastrointestinal:  Denies nausea, heartburn or change in bowel habits Skin: Denies abnormal skin rashes Lymphatics: Denies new lymphadenopathy or easy bruising Neurological:Denies numbness, tingling or new weaknesses Behavioral/Psych: Mood is stable, no new changes  All other systems were reviewed with the patient and are negative.  PHYSICAL EXAMINATION:   Vitals:   06/20/23 1132  BP: 106/69  Pulse: (!) 58  Resp: 18  Temp: (!) 97.3 F (36.3 C)  SpO2: 100%    GENERAL:alert, no distress and comfortable SKIN: skin color, texture, turgor are normal, no rashes or significant lesions LUNGS: clear to auscultation and percussion with normal breathing effort HEART: regular rate & rhythm and no murmurs and no lower extremity edema ABDOMEN:abdomen  soft, non-tender and normal bowel sounds Musculoskeletal:no cyanosis of digits and no clubbing  NEURO: alert & oriented x 3 with fluent speech  LABORATORY DATA:  I have reviewed the data as listed  Lab Results  Component Value Date   WBC 5.1 06/12/2023   NEUTROABS 3.2 06/12/2023   HGB 8.1 (L)  06/12/2023   HCT 29.2 (L) 06/12/2023   MCV 75.3 (L) 06/12/2023   PLT 425 (H) 06/12/2023       Chemistry      Component Value Date/Time   NA 136 05/30/2023 0350   NA 141 09/11/2017 1653   K 3.6 05/30/2023 0350   CL 110 05/30/2023 0350   CO2 23 05/30/2023 0350   BUN 6 05/30/2023 0350   BUN 9 09/11/2017 1653   CREATININE 0.53 05/30/2023 0350      Component Value Date/Time   CALCIUM 8.4 (L) 05/30/2023 0350   ALKPHOS 59 05/30/2023 0350   AST 16 05/30/2023 0350   ALT 13 05/30/2023 0350   BILITOT 1.2 05/30/2023 0350   BILITOT 0.2 09/11/2017 1653      Latest Reference Range & Units 05/29/23 10:59  Iron 28 - 170 ug/dL 9 (L)  UIBC ug/dL 536  TIBC 749 - 549 ug/dL 527 (H)  Saturation Ratios 10.4 - 31.8 % 2 (L)  Ferritin 11 - 307 ng/mL 1 (L)  Folate >5.9 ng/mL 17.5  Vitamin B12 180 - 914 pg/mL 202  (L): Data is abnormally low (H): Data is abnormally high  ASSESSMENT & PLAN:  Patient is a 48 year old female with past medical history of gastric bypass referred for iron deficiency anemia   IDA (iron deficiency anemia) Severely low ferritin and percent sat levels.  Likely secondary to ?Blood loss and malabsorption.  Patient does report occasional heavy menstrual bleeding but did not bleed in the past 4 months. -The most likely cause of her anemia is due to chronic blood loss and malabsorption syndrome. We discussed some of the risks, benefits, and alternatives of intravenous iron infusions. The patient is symptomatic from anemia and the iron level is critically low. Desires to achieve higher levels of iron faster for adequate hematopoesis. Some of the side-effects to be expected including risks of infusion reactions, phlebitis, headaches, nausea and fatigue.  The patient is willing to proceed. Patient education material was dispensed.  Goal is to keep ferritin level greater than 50 and resolution of anemia -Continue oral iron supplementation every other day, avoiding intake with coffee  and preferably with orange juice for better absorption. -Proceed with scheduled colonoscopy on 07/03/2023. -Consult with Gynecologist to evaluate menstrual irregularities and potential contribution to anemia.  Plan to reassess in 6 weeks after IV iron therapy. Further workup may be needed if anemia does not improve or if other concerning findings arise.    Vitamin B12 deficiency Low B12 levels, likely due to gastric bypass surgery limiting absorption. -Increase B12 injections to weekly for 4 weeks, then monthly thereafter. -Continue oral B12 supplementation.   Orders Placed This Encounter  Procedures   Ferritin    Standing Status:   Future    Expected Date:   07/31/2023    Expiration Date:   06/19/2024   Folate    Standing Status:   Future    Expected Date:   07/31/2023    Expiration Date:   06/19/2024   Vitamin B12    Standing Status:   Future    Expected Date:   07/31/2023    Expiration Date:   06/19/2024  CBC with Differential/Platelet    Standing Status:   Future    Expected Date:   07/31/2023    Expiration Date:   06/19/2024   Comprehensive metabolic panel    Standing Status:   Future    Expected Date:   07/31/2023    Expiration Date:   06/19/2024   Iron and TIBC    Standing Status:   Future    Expected Date:   07/31/2023    Expiration Date:   06/19/2024    The total time spent in the appointment was 40 minutes encounter with patients including review of chart and various tests results, discussions about plan of care and coordination of care plan   All questions were answered. The patient knows to call the clinic with any problems, questions or concerns. No barriers to learning was detected.   Mickiel Dry, MD 2/4/202512:58 PM

## 2023-06-20 NOTE — Assessment & Plan Note (Addendum)
 Severely low ferritin and percent sat levels.  Likely secondary to ?Blood loss and malabsorption.  Patient does report occasional heavy menstrual bleeding but did not bleed in the past 4 months. -The most likely cause of her anemia is due to chronic blood loss and malabsorption syndrome. We discussed some of the risks, benefits, and alternatives of intravenous iron infusions. The patient is symptomatic from anemia and the iron level is critically low. Desires to achieve higher levels of iron faster for adequate hematopoesis. Some of the side-effects to be expected including risks of infusion reactions, phlebitis, headaches, nausea and fatigue.  The patient is willing to proceed. Patient education material was dispensed.  Goal is to keep ferritin level greater than 50 and resolution of anemia -Continue oral iron supplementation every other day, avoiding intake with coffee and preferably with orange juice for better absorption. -Proceed with scheduled colonoscopy on 07/03/2023. -Consult with Gynecologist to evaluate menstrual irregularities and potential contribution to anemia.  Plan to reassess in 6 weeks after IV iron therapy. Further workup may be needed if anemia does not improve or if other concerning findings arise.

## 2023-06-20 NOTE — Patient Instructions (Signed)
 VISIT SUMMARY:  During your visit, we discussed your persistently low hemoglobin levels and severe iron deficiency, which have not significantly improved despite recent blood transfusions. We also addressed your irregular and heavy menstrual cycles, fatigue, and shortness of breath. Additionally, we reviewed your vitamin B12 deficiency and the upcoming colonoscopy to investigate potential sources of blood loss.  YOUR PLAN:  -IRON DEFICIENCY ANEMIA: Iron deficiency anemia occurs when your body lacks enough iron to produce adequate red blood cells. We will administer two doses of IV iron, 500mg  each, a week apart after insurance approval. Continue taking oral iron supplements every other day, avoiding coffee and taking them with orange juice for better absorption. We will check your labs in 6 weeks to see how you respond to the IV iron.  -MENSTRUAL IRREGULARITIES: Irregular and heavy menstrual cycles can contribute to anemia. We recommend consulting with a Gynecologist to evaluate your menstrual irregularities and their potential contribution to your anemia.  -VITAMIN B12 DEFICIENCY: Vitamin B12 deficiency can occur due to malabsorption, especially after gastric bypass surgery. We will increase your B12 injections to weekly for 4 weeks, then continue with monthly injections. Please continue taking your oral B12 supplements.  -COLONOSCOPY: A colonoscopy is a procedure to examine the inside of your colon for potential sources of blood loss. Please proceed with your scheduled colonoscopy on 07/03/2023. If the colonoscopy does not reveal the source of blood loss, we may consider a capsule endoscopy.  -GASTRIC BYPASS: Your history of gastric bypass surgery can lead to malabsorption of nutrients like iron and B12. We will continue to monitor and supplement these nutrients as needed.  INSTRUCTIONS:  Please follow up in 6 weeks after your IV iron therapy to reassess your condition. Proceed with your scheduled  colonoscopy on 07/03/2023. Consult with a Gynecologist regarding your menstrual irregularities.

## 2023-06-27 NOTE — Patient Instructions (Signed)
Heather Hardin  06/27/2023     @PREFPERIOPPHARMACY @   Your procedure is scheduled on  07/03/2023.   Report to [facility] at  0700  A.M.   Call this number if you have problems the morning of surgery:  [phone]  If you experience any cold or flu symptoms such as cough, fever, chills, shortness of breath, etc. between now and your scheduled surgery, please notify us at the above number.   Remember:  Follow the diet and prep instructions given to you by the office.   You may drink clear liquids until 0430 am on 07/03/2023.   Clear liquids allowed are:                    Water, Juice (No red color; non-citric and without pulp; diabetics please choose diet or no sugar options), Carbonated beverages (diabetics please choose diet or no sugar options), Clear Tea (No creamer, milk, or cream, including half & half and powdered creamer), Black Coffee Only (No creamer, milk or cream, including half & half and powdered creamer), and Clear Sports drink (No red color; diabetics please choose diet or no sugar options)    Take these medicines the morning of surgery with A SIP OF WATER                                         pantoprazole.    Do not wear jewelry, make-up or nail polish, including gel polish,  artificial nails, or any other type of covering on natural nails (fingers and  toes).  Do not wear lotions, powders, or perfumes, or deodorant.  Do not shave 48 hours prior to surgery.  Men may shave face and neck.  Do not bring valuables to the hospital.  [facility] is not responsible for any belongings or valuables.  Contacts, dentures or bridgework may not be worn into surgery.  Leave your suitcase in the car.  After surgery it may be brought to your room.  For patients admitted to the hospital, discharge time will be determined by your treatment team.  Patients discharged the day of surgery will not be allowed to drive home and must have someone with them for 24 hours.     Special instructions:   DO NOT smoke tobacco or vape for 24 hours before your procedure.  Please read over the following fact sheets that you were given. Anesthesia Post-op Instructions and Care and Recovery After Surgery      Colonoscopy, Adult, Care After The following information offers guidance on how to care for yourself after your procedure. Your health care provider may also give you more specific instructions. If you have problems or questions, contact your health care provider. What can I expect after the procedure? After the procedure, it is common to have: A small amount of blood in your stool for 24 hours after the procedure. Some gas. Mild cramping or bloating of your abdomen. Follow these instructions at home: Eating and drinking  Drink enough fluid to keep your urine pale yellow. Follow instructions from your health care provider about eating or drinking restrictions. Resume your normal diet as told by your health care provider. Avoid heavy or fried foods that are hard to digest. Activity Rest as told by your health care provider. Avoid sitting for a long time without moving. Get up to take short  walks every 1-2 hours. This is important to improve blood flow and breathing. Ask for help if you feel weak or unsteady. Return to your normal activities as told by your health care provider. Ask your health care provider what activities are safe for you. Managing cramping and bloating  Try walking around when you have cramps or feel bloated. If directed, apply heat to your abdomen as told by your health care provider. Use the heat source that your health care provider recommends, such as a moist heat pack or a heating pad. Place a towel between your skin and the heat source. Leave the heat on for 20-30 minutes. Remove the heat if your skin turns bright red. This is especially important if you are unable to feel pain, heat, or cold. You have a greater risk of getting  burned. General instructions If you were given a sedative during the procedure, it can affect you for several hours. Do not drive or operate machinery until your health care provider says that it is safe. For the first 24 hours after the procedure: Do not sign important documents. Do not drink alcohol. Do your regular daily activities at a slower pace than normal. Eat soft foods that are easy to digest. Take over-the-counter and prescription medicines only as told by your health care provider. Keep all follow-up visits. This is important. Contact a health care provider if: You have blood in your stool 2-3 days after the procedure. Get help right away if: You have more than a small spotting of blood in your stool. You have large blood clots in your stool. You have swelling of your abdomen. You have nausea or vomiting. You have a fever. You have increasing pain in your abdomen that is not relieved with medicine. These symptoms may be an emergency. Get help right away. Call 911. Do not wait to see if the symptoms will go away. Do not drive yourself to the hospital. Summary After the procedure, it is common to have a small amount of blood in your stool. You may also have mild cramping and bloating of your abdomen. If you were given a sedative during the procedure, it can affect you for several hours. Do not drive or operate machinery until your health care provider says that it is safe. Get help right away if you have a lot of blood in your stool, nausea or vomiting, a fever, or increased pain in your abdomen. This information is not intended to replace advice given to you by your health care provider. Make sure you discuss any questions you have with your health care provider. Document Revised: 06/14/2022 Document Reviewed: 12/23/2020 Elsevier Patient Education  2024 [employer]Monitored Anesthesia Care, Care After The following information offers guidance on how to care for yourself  after your procedure. Your health care provider may also give you more specific instructions. If you have problems or questions, contact your health care provider. What can I expect after the procedure? After the procedure, it is common to have: Tiredness. Little or no memory about what happened during or after the procedure. Impaired judgment when it comes to making decisions. Nausea or vomiting. Some trouble with balance. Follow these instructions at home: For the time period you were told by your health care provider:  Rest. Do not participate in activities where you could fall or become injured. Do not drive or use machinery. Do not drink alcohol. Do not take sleeping pills or medicines that cause drowsiness. Do not make important decisions  or sign legal documents. Do not take care of children on your own. Medicines Take over-the-counter and prescription medicines only as told by your health care provider. If you were prescribed antibiotics, take them as told by your health care provider. Do not stop using the antibiotic even if you start to feel better. Eating and drinking Follow instructions from your health care provider about what you may eat and drink. Drink enough fluid to keep your urine pale yellow. If you vomit: Drink clear fluids slowly and in small amounts as you are able. Clear fluids include water, ice chips, low-calorie sports drinks, and fruit juice that has water added to it (diluted fruit juice). Eat light and bland foods in small amounts as you are able. These foods include bananas, applesauce, rice, lean meats, toast, and crackers. General instructions  Have a responsible adult stay with you for the time you are told. It is important to have someone help care for you until you are awake and alert. If you have sleep apnea, surgery and some medicines can increase your risk for breathing problems. Follow instructions from your health care provider about wearing your  sleep device: When you are sleeping. This includes during daytime naps. While taking prescription pain medicines, sleeping medicines, or medicines that make you drowsy. Do not use any products that contain nicotine or tobacco. These products include cigarettes, chewing tobacco, and vaping devices, such as e-cigarettes. If you need help quitting, ask your health care provider. Contact a health care provider if: You feel nauseous or vomit every time you eat or drink. You feel light-headed. You are still sleepy or having trouble with balance after 24 hours. You get a rash. You have a fever. You have redness or swelling around the IV site. Get help right away if: You have trouble breathing. You have new confusion after you get home. These symptoms may be an emergency. Get help right away. Call 911. Do not wait to see if the symptoms will go away. Do not drive yourself to the hospital. This information is not intended to replace advice given to you by your health care provider. Make sure you discuss any questions you have with your health care provider. Document Revised: 09/27/2021 Document Reviewed: 09/27/2021 Elsevier Patient Education  2024 [employer].

## 2023-06-29 ENCOUNTER — Encounter (HOSPITAL_COMMUNITY)
Admission: RE | Admit: 2023-06-29 | Discharge: 2023-06-29 | Disposition: A | Discharge status: Home / Self Care | Payer: Medicaid Other | Source: Ambulatory Visit | Attending: Internal Medicine | Admitting: Internal Medicine

## 2023-06-29 ENCOUNTER — Other Ambulatory Visit: Payer: Self-pay

## 2023-06-29 ENCOUNTER — Encounter (HOSPITAL_COMMUNITY): Payer: Self-pay

## 2023-06-29 VITALS — BP 106/69 | HR 58 | Temp 97.3°F | Resp 18 | Ht 66.0 in | Wt 161.4 lb

## 2023-06-29 DIAGNOSIS — Z01812 Encounter for preprocedural laboratory examination: Secondary | ICD-10-CM | POA: Insufficient documentation

## 2023-06-29 DIAGNOSIS — Z01818 Encounter for other preprocedural examination: Secondary | ICD-10-CM

## 2023-06-29 DIAGNOSIS — D500 Iron deficiency anemia secondary to blood loss (chronic): Secondary | ICD-10-CM

## 2023-06-29 LAB — CBC WITH DIFFERENTIAL/PLATELET
Abs Immature Granulocytes: 0.01 10*3/uL (ref 0.00–0.07)
Basophils Absolute: 0.1 10*3/uL (ref 0.0–0.1)
Basophils Relative: 1 %
Eosinophils Absolute: 0.1 10*3/uL (ref 0.0–0.5)
Eosinophils Relative: 2 %
HCT: 31.5 % — ABNORMAL LOW (ref 36.0–46.0)
Hemoglobin: 9 g/dL — ABNORMAL LOW (ref 12.0–15.0)
Immature Granulocytes: 0 %
Lymphocytes Relative: 25 %
Lymphs Abs: 1.2 10*3/uL (ref 0.7–4.0)
MCH: 22.3 pg — ABNORMAL LOW (ref 26.0–34.0)
MCHC: 28.6 g/dL — ABNORMAL LOW (ref 30.0–36.0)
MCV: 78 fL — ABNORMAL LOW (ref 80.0–100.0)
Monocytes Absolute: 0.4 10*3/uL (ref 0.1–1.0)
Monocytes Relative: 8 %
Neutro Abs: 3.2 10*3/uL (ref 1.7–7.7)
Neutrophils Relative %: 64 %
Platelets: 279 10*3/uL (ref 150–400)
RBC: 4.04 MIL/uL (ref 3.87–5.11)
RDW: 24.7 % — ABNORMAL HIGH (ref 11.5–15.5)
Smear Review: ADEQUATE
WBC: 5 10*3/uL (ref 4.0–10.5)
nRBC: 0 % (ref 0.0–0.2)

## 2023-06-29 LAB — POCT PREGNANCY, URINE: Preg Test, Ur: NEGATIVE

## 2023-06-30 ENCOUNTER — Inpatient Hospital Stay: Payer: Medicaid Other

## 2023-06-30 VITALS — BP 110/70 | HR 54 | Temp 97.7°F | Resp 17

## 2023-06-30 DIAGNOSIS — Z79899 Other long term (current) drug therapy: Secondary | ICD-10-CM | POA: Diagnosis not present

## 2023-06-30 DIAGNOSIS — Z9884 Bariatric surgery status: Secondary | ICD-10-CM | POA: Diagnosis not present

## 2023-06-30 DIAGNOSIS — N921 Excessive and frequent menstruation with irregular cycle: Secondary | ICD-10-CM | POA: Diagnosis not present

## 2023-06-30 DIAGNOSIS — E538 Deficiency of other specified B group vitamins: Secondary | ICD-10-CM | POA: Diagnosis not present

## 2023-06-30 DIAGNOSIS — D500 Iron deficiency anemia secondary to blood loss (chronic): Secondary | ICD-10-CM

## 2023-06-30 DIAGNOSIS — D509 Iron deficiency anemia, unspecified: Secondary | ICD-10-CM | POA: Diagnosis not present

## 2023-06-30 MED ORDER — CETIRIZINE HCL 10 MG PO TABS
10.0000 mg | ORAL_TABLET | Freq: Once | ORAL | Status: AC
Start: 2023-06-30 — End: 2023-06-30
  Administered 2023-06-30: 10 mg via ORAL
  Filled 2023-06-30: qty 1

## 2023-06-30 MED ORDER — FERUMOXYTOL INJECTION 510 MG/17 ML
510.0000 mg | Freq: Once | INTRAVENOUS | Status: AC
  Administered 2023-06-30: 510 mg via INTRAVENOUS
  Filled 2023-06-30: qty 510

## 2023-06-30 MED ORDER — ACETAMINOPHEN 325 MG PO TABS
650.0000 mg | ORAL_TABLET | Freq: Once | ORAL | Status: AC
  Administered 2023-06-30: 650 mg via ORAL
  Filled 2023-06-30: qty 2

## 2023-06-30 MED ORDER — SODIUM CHLORIDE 0.9 % IV SOLN: INTRAVENOUS | Status: DC

## 2023-06-30 NOTE — Patient Instructions (Signed)
[facility] [place] - A DEPT OF [facility]  Discharge Instructions: Thank you for choosing [facility] to provide your oncology and hematology care.  If you have a lab appointment with the Cancer Center - please note that after April 8th, 2024, all labs will be drawn in the cancer center.  You do not have to check in or register with the main entrance as you have in the past but will complete your check-in in the cancer center.  Wear comfortable clothing and clothing appropriate for easy access to any Portacath or PICC line.   We strive to give you quality time with your provider. You may need to reschedule your appointment if you arrive late (15 or more minutes).  Arriving late affects you and other patients whose appointments are after yours.  Also, if you miss three or more appointments without notifying the office, you may be dismissed from the clinic at the provider's discretion.      For prescription refill requests, have your pharmacy contact our office and allow 72 hours for refills to be completed.    Today you received the following:  Feraheme.  Ferumoxytol Injection What is this medication? FERUMOXYTOL (FER ue MOX i tol) treats low levels of iron in your body (iron deficiency anemia). Iron is a mineral that plays an important role in making red blood cells, which carry oxygen from your lungs to the rest of your body. This medicine may be used for other purposes; ask your health care provider or pharmacist if you have questions. COMMON BRAND NAME(S): Feraheme What should I tell my care team before I take this medication? They need to know if you have any of these conditions: Anemia not caused by low iron levels High levels of iron in the blood Magnetic resonance imaging (MRI) test scheduled An unusual or allergic reaction to iron, other medications, foods, dyes, or preservatives Pregnant or trying to get pregnant Breastfeeding How should I  use this medication? This medication is injected into a vein. It is given by your care team in a hospital or clinic setting. Talk to your care team the use of this medication in children. Special care may be needed. Overdosage: If you think you have taken too much of this medicine contact a poison control center or emergency room at once. NOTE: This medicine is only for you. Do not share this medicine with others. What if I miss a dose? It is important not to miss your dose. Call your care team if you are unable to keep an appointment. What may interact with this medication? Other iron products This list may not describe all possible interactions. Give your health care provider a list of all the medicines, herbs, non-prescription drugs, or dietary supplements you use. Also tell them if you smoke, drink alcohol, or use illegal drugs. Some items may interact with your medicine. What should I watch for while using this medication? Visit your care team for regular checks on your progress. Tell your care team if your symptoms do not start to get better or if they get worse. You may need blood work done while you are taking this medication. You may need to eat more foods that contain iron. Talk to your care team. Foods that contain iron include whole grains or cereals, dried fruits, beans, peas, leafy green vegetables, and organ meats (liver, kidney). What side effects may I notice from receiving this medication? Side effects that you should  report to your care team as soon as possible: Allergic reactions--skin rash, itching, hives, swelling of the face, lips, tongue, or throat Low blood pressure--dizziness, feeling faint or lightheaded, blurry vision Shortness of breath Side effects that usually do not require medical attention (report to your care team if they continue or are bothersome): Flushing Headache Joint pain Muscle pain Nausea Pain, redness, or irritation at injection site This list  may not describe all possible side effects. Call your doctor for medical advice about side effects. You may report side effects to FDA at 1-800-FDA-1088. Where should I keep my medication? This medication is given in a hospital or clinic. It will not be stored at home. NOTE: This sheet is a summary. It may not cover all possible information. If you have questions about this medicine, talk to your doctor, pharmacist, or health care provider.  2024 Elsevier/Gold Standard (2022-12-21 00:00:00)    To help prevent nausea and vomiting after your treatment, we encourage you to take your nausea medication as directed.  BELOW ARE SYMPTOMS THAT SHOULD BE REPORTED IMMEDIATELY: *FEVER GREATER THAN 100.4 F (38 C) OR HIGHER *CHILLS OR SWEATING *NAUSEA AND VOMITING THAT IS NOT CONTROLLED WITH YOUR NAUSEA MEDICATION *UNUSUAL SHORTNESS OF BREATH *UNUSUAL BRUISING OR BLEEDING *URINARY PROBLEMS (pain or burning when urinating, or frequent urination) *BOWEL PROBLEMS (unusual diarrhea, constipation, pain near the anus) TENDERNESS IN MOUTH AND THROAT WITH OR WITHOUT PRESENCE OF ULCERS (sore throat, sores in mouth, or a toothache) UNUSUAL RASH, SWELLING OR PAIN  UNUSUAL VAGINAL DISCHARGE OR ITCHING   Items with * indicate a potential emergency and should be followed up as soon as possible or go to the Emergency Department if any problems should occur.  Please show the CHEMOTHERAPY ALERT CARD or IMMUNOTHERAPY ALERT CARD at check-in to the Emergency Department and triage nurse.  Should you have questions after your visit or need to cancel or reschedule your appointment, please contact [facility] [place] - A DEPT OF Eligha [facility]ideg[facility] [phone]  and follow the prompts.  Office hours are 8:00 a.m. to 4:30 p.m. Monday - Friday. Please note that voicemails left after 4:00 p.m. may not be returned until the following business day.  We are closed weekends and major holidays. You have access to a  nurse at all times for urgent questions. Please call the main number to the clinic [phone] and follow the prompts.  For any non-urgent questions, you may also contact your provider using MyChart. We now offer e-Visits for anyone 39 and older to request care online for non-urgent symptoms. For details visit [email or web address].   Also download the MyChart app! Go to the app store, search "MyChart", open the app, select [place], and log in with your MyChart username and password.

## 2023-06-30 NOTE — Progress Notes (Signed)
Patient presents today for iron infusion.  Patient is in satisfactory condition with no new complaints voiced.  Vital signs are stable.  IV placed in L arm.  IV flushed well with good blood return noted.  We will proceed with infusion per provider orders.    Patient tolerated treatment well with no complaints voiced.  Patient left ambulatory with husband in stable condition.  Vital signs stable at discharge.  Follow up as scheduled.

## 2023-07-03 ENCOUNTER — Other Ambulatory Visit: Payer: Self-pay

## 2023-07-03 ENCOUNTER — Ambulatory Visit (HOSPITAL_COMMUNITY): Payer: Medicaid Other | Admitting: Anesthesiology

## 2023-07-03 ENCOUNTER — Encounter (HOSPITAL_COMMUNITY)
Admission: RE | Disposition: A | Discharge status: Home / Self Care | Payer: Self-pay | Source: Ambulatory Visit | Attending: Internal Medicine

## 2023-07-03 ENCOUNTER — Encounter (HOSPITAL_COMMUNITY): Payer: Self-pay | Admitting: Internal Medicine

## 2023-07-03 ENCOUNTER — Ambulatory Visit (HOSPITAL_COMMUNITY)
Admission: RE | Admit: 2023-07-03 | Discharge: 2023-07-03 | Disposition: A | Discharge status: Home / Self Care | Payer: Medicaid Other | Source: Ambulatory Visit | Attending: Internal Medicine | Admitting: Internal Medicine

## 2023-07-03 DIAGNOSIS — K649 Unspecified hemorrhoids: Secondary | ICD-10-CM

## 2023-07-03 DIAGNOSIS — Z87891 Personal history of nicotine dependence: Secondary | ICD-10-CM | POA: Insufficient documentation

## 2023-07-03 DIAGNOSIS — D509 Iron deficiency anemia, unspecified: Secondary | ICD-10-CM

## 2023-07-03 DIAGNOSIS — K648 Other hemorrhoids: Secondary | ICD-10-CM | POA: Diagnosis not present

## 2023-07-03 DIAGNOSIS — K219 Gastro-esophageal reflux disease without esophagitis: Secondary | ICD-10-CM | POA: Insufficient documentation

## 2023-07-03 DIAGNOSIS — E039 Hypothyroidism, unspecified: Secondary | ICD-10-CM

## 2023-07-03 DIAGNOSIS — G4730 Sleep apnea, unspecified: Secondary | ICD-10-CM | POA: Insufficient documentation

## 2023-07-03 DIAGNOSIS — I10 Essential (primary) hypertension: Secondary | ICD-10-CM | POA: Insufficient documentation

## 2023-07-03 HISTORY — PX: COLONOSCOPY WITH PROPOFOL: SHX5780

## 2023-07-03 SURGERY — COLONOSCOPY WITH PROPOFOL
Anesthesia: General

## 2023-07-03 MED ORDER — PROPOFOL 10 MG/ML IV BOLUS
INTRAVENOUS | Status: DC | PRN
  Administered 2023-07-03 (×2): 50 mg via INTRAVENOUS

## 2023-07-03 MED ORDER — STERILE WATER FOR IRRIGATION IR SOLN
Status: DC | PRN
  Administered 2023-07-03: 60 mL

## 2023-07-03 MED ORDER — LACTATED RINGERS IV SOLN: INTRAVENOUS | Status: DC | PRN

## 2023-07-03 MED ORDER — SODIUM CHLORIDE 0.9% FLUSH: 3.0000 mL | Freq: Two times a day (BID) | INTRAVENOUS | Status: DC

## 2023-07-03 MED ORDER — LIDOCAINE HCL (CARDIAC) PF 100 MG/5ML IV SOSY
PREFILLED_SYRINGE | INTRAVENOUS | Status: DC | PRN
  Administered 2023-07-03: 80 mg via INTRAVENOUS

## 2023-07-03 MED ORDER — PROPOFOL 500 MG/50ML IV EMUL
INTRAVENOUS | Status: DC | PRN
  Administered 2023-07-03: 150 ug/kg/min via INTRAVENOUS

## 2023-07-03 MED ORDER — SODIUM CHLORIDE 0.9% FLUSH: 3.0000 mL | INTRAVENOUS | Status: DC | PRN

## 2023-07-03 MED ORDER — LIDOCAINE HCL (CARDIAC) PF 100 MG/5ML IV SOSY: PREFILLED_SYRINGE | INTRAVENOUS | Status: DC | PRN

## 2023-07-03 NOTE — Anesthesia Preprocedure Evaluation (Signed)
Anesthesia Evaluation  Patient identified by MRN, date of birth, ID band Patient awake    Reviewed: Allergy & Precautions, H&P , NPO status , Patient's Chart, lab work & pertinent test results, reviewed documented beta blocker date and time   History of Anesthesia Complications (+) PONV, Family history of anesthesia reaction and history of anesthetic complications  Airway Mallampati: II  TM Distance: >3 FB Neck ROM: full    Dental no notable dental hx.    Pulmonary neg pulmonary ROS, sleep apnea , former smoker   Pulmonary exam normal breath sounds clear to auscultation       Cardiovascular Exercise Tolerance: Good hypertension, + DOE  negative cardio ROS  Rhythm:regular Rate:Normal     Neuro/Psych  PSYCHIATRIC DISORDERS Anxiety Depression    negative neurological ROS  negative psych ROS   GI/Hepatic negative GI ROS, Neg liver ROS,GERD  ,,  Endo/Other  negative endocrine ROSHypothyroidism    Renal/GU negative Renal ROS  negative genitourinary   Musculoskeletal   Abdominal   Peds  Hematology negative hematology ROS (+) Blood dyscrasia, anemia   Anesthesia Other Findings   Reproductive/Obstetrics negative OB ROS                             Anesthesia Physical Anesthesia Plan  ASA: 3  Anesthesia Plan: General   Post-op Pain Management:    Induction:   PONV Risk Score and Plan: Propofol infusion  Airway Management Planned:   Additional Equipment:   Intra-op Plan:   Post-operative Plan:   Informed Consent: I have reviewed the patients History and Physical, chart, labs and discussed the procedure including the risks, benefits and alternatives for the proposed anesthesia with the patient or authorized representative who has indicated his/her understanding and acceptance.     Dental Advisory Given  Plan Discussed with: CRNA  Anesthesia Plan Comments:        Anesthesia  Quick Evaluation

## 2023-07-03 NOTE — Op Note (Signed)
[facility] Patient Name: Heather Hardin Procedure Date: 07/03/2023 8:42 AM MRN: [id number] Date of Birth: [birth year] Attending MD: Hennie Duos. Marletta Lor , [place], [id number] CSN: [id number] Age: 48 Admit Type: Outpatient Procedure:                Colonoscopy Indications:              Unexplained iron deficiency anemia Providers:                Hennie Duos. Marletta Lor, DO, Angelica Ran, Dyann Ruddle Referring MD:              Medicines:                See the Anesthesia note for documentation of the                            administered medications Complications:            No immediate complications. Estimated Blood Loss:     Estimated blood loss: none. Procedure:                Pre-Anesthesia Assessment:                           - The anesthesia plan was to use monitored                            anesthesia care (MAC).                           After obtaining informed consent, the colonoscope                            was passed under direct vision. Throughout the                            procedure, the patient's blood pressure, pulse, and                            oxygen saturations were monitored continuously. The                            [id number]-HQ190L ([id number]) scope was introduced through                            the anus and advanced to the the terminal ileum,                            with identification of the appendiceal orifice and                            IC valve. The colonoscopy was performed without                            difficulty. The patient tolerated the procedure                            well. The  quality of the bowel preparation was                            evaluated using the BBPS [facility] Bowel Preparation                            Scale) with scores of: Right Colon = 3, Transverse                            Colon = 3 and Left Colon = 3 (entire mucosa seen                            well with no residual staining, small fragments of                             stool or opaque liquid). The total BBPS score                            equals 9. Scope In: 9:20:38 AM Scope Out: 9:31:29 AM Scope Withdrawal Time: 0 hours 7 minutes 48 seconds  Total Procedure Duration: 0 hours 10 minutes 51 seconds  Findings:      Non-bleeding internal hemorrhoids were found during endoscopy.      The terminal ileum appeared normal.      The entire examined colon appeared normal. Impression:               - Non-bleeding internal hemorrhoids.                           - The examined portion of the ileum was normal.                           - The entire examined colon is normal.                           - No specimens collected. Moderate Sedation:      Per Anesthesia Care Recommendation:           - Patient has a contact number available for                            emergencies. The signs and symptoms of potential                            delayed complications were discussed with the                            patient. Return to normal activities tomorrow.                            Written discharge instructions were provided to the                            patient.                           -  Continue present medications.                           - Resume previous diet.                           - Repeat colonoscopy in 10 years for screening                            purposes.                           - Return to GI clinic in 6 weeks. Consider capsule                            endoscopy to complete GI work up Procedure Code(s):        --- Professional ---                           [phone], Colonoscopy, flexible; diagnostic, including                            collection of specimen(s) by brushing or washing,                            when performed (separate procedure) Diagnosis Code(s):        --- Professional ---                           K64.8, Other hemorrhoids                           D50.9, Iron deficiency anemia, unspecified CPT  copyright 2022 American Medical Association. All rights reserved. The codes documented in this report are preliminary and upon coder review may  be revised to meet current compliance requirements. Hennie Duos. Marletta Lor, DO Hennie Duos. Marletta Lor, DO 07/03/2023 9:36:24 AM This report has been signed electronically. Number of Addenda: 0

## 2023-07-03 NOTE — Discharge Instructions (Addendum)
  Colonoscopy Discharge Instructions  Read the instructions outlined below and refer to this sheet in the next few weeks. These discharge instructions provide you with general information on caring for yourself after you leave the hospital. Your doctor may also give you specific instructions. While your treatment has been planned according to the most current medical practices available, unavoidable complications occasionally occur.   ACTIVITY You may resume your regular activity, but move at a slower pace for the next 24 hours.  Take frequent rest periods for the next 24 hours.  Walking will help get rid of the air and reduce the bloated feeling in your belly (abdomen).  No driving for 24 hours (because of the medicine (anesthesia) used during the test).   Do not sign any important legal documents or operate any machinery for 24 hours (because of the anesthesia used during the test).  NUTRITION Drink plenty of fluids.  You may resume your normal diet as instructed by your doctor.  Begin with a light meal and progress to your normal diet. Heavy or fried foods are harder to digest and may make you feel sick to your stomach (nauseated).  Avoid alcoholic beverages for 24 hours or as instructed.  MEDICATIONS You may resume your normal medications unless your doctor tells you otherwise.  WHAT YOU CAN EXPECT TODAY Some feelings of bloating in the abdomen.  Passage of more gas than usual.  Spotting of blood in your stool or on the toilet paper.  IF YOU HAD POLYPS REMOVED DURING THE COLONOSCOPY: No aspirin products for 7 days or as instructed.  No alcohol for 7 days or as instructed.  Eat a soft diet for the next 24 hours.  FINDING OUT THE RESULTS OF YOUR TEST Not all test results are available during your visit. If your test results are not back during the visit, make an appointment with your caregiver to find out the results. Do not assume everything is normal if you have not heard from your  caregiver or the medical facility. It is important for you to follow up on all of your test results.  SEEK IMMEDIATE MEDICAL ATTENTION IF: You have more than a spotting of blood in your stool.  Your belly is swollen (abdominal distention).  You are nauseated or vomiting.  You have a temperature over 101.  You have abdominal pain or discomfort that is severe or gets worse throughout the day.   Your colonoscopy was relatively unremarkable.  I did not find any polyps or evidence of colon cancer.  I recommend repeating colonoscopy in 10 years for colon cancer screening purposes.  I did not see any evidence of blood loss throughout your colon.  Follow-up in GI office in 4 to 6 weeks.  We can consider capsule endoscopy to complete your GI workup.  Continue iron therapy.   I hope you have a great rest of your week!  Hennie Duos. Marletta Lor, D.O. Gastroenterology and Hepatology [facility]

## 2023-07-03 NOTE — Transfer of Care (Signed)
Immediate Anesthesia Transfer of Care Note  Patient: Dava Najjar  Procedure(s) Performed: COLONOSCOPY WITH PROPOFOL  Patient Location: PACU and Endoscopy Unit  Anesthesia Type:MAC  Level of Consciousness: awake and alert   Airway & Oxygen Therapy: Patient Spontanous Breathing and Patient connected to face mask oxygen  Post-op Assessment: Report given to RN  Post vital signs: Reviewed and stable  Last Vitals:  Vitals Value Taken Time  BP 103/53 07/03/23 0938  Temp 36.7 C 07/03/23 0938  Pulse 56 07/03/23 0938  Resp 12 07/03/23 0938  SpO2 100 % 07/03/23 0938    Last Pain:  Vitals:   07/03/23 0938  TempSrc: Oral  PainSc: 0-No pain         Complications: No notable events documented.

## 2023-07-03 NOTE — H&P (Signed)
Primary Care Physician:  [name], FNP Primary Gastroenterologist:  Dr. Marletta Lor  Pre-Procedure History & Physical: HPI:  Heather Hardin is a 48 y.o. female is here for a colonoscopy to be performed for profound iron deficiency anemia  Past Medical History:  Diagnosis Date   Anemia    in youth ; sometimes iron has been low    Depression    Family history of adverse reaction to anesthesia    reports son is slow to wake    GERD (gastroesophageal reflux disease)    Hypertension    PONV (postoperative nausea and vomiting)    Sleep apnea    uses CPAP occ    Thyroid disease     Past Surgical History:  Procedure Laterality Date   BIOPSY  05/30/2023   Procedure: BIOPSY;  Surgeon: Dolores Frame, MD;  Location: [facility];  Service: Gastroenterology;;   CESAREAN SECTION  1997, 1999, 2004   COLONOSCOPY WITH PROPOFOL N/A 05/30/2023   Procedure: COLONOSCOPY WITH PROPOFOL;  Surgeon: Dolores Frame, MD;  Location: [facility];  Service: Gastroenterology;  Laterality: N/A;   ESOPHAGOGASTRODUODENOSCOPY (EGD) WITH PROPOFOL N/A 05/30/2023   Procedure: ESOPHAGOGASTRODUODENOSCOPY (EGD) WITH PROPOFOL;  Surgeon: Dolores Frame, MD;  Location: [facility];  Service: Gastroenterology;  Laterality: N/A;   GASTRIC ROUX-EN-Y N/A 10/23/2017   Procedure: LAPAROSCOPIC ROUX-EN-Y GASTRIC BYPASS WITH UPPER ENDOSCOPY, ERAS Pathway;  Surgeon: Glenna Fellows, MD;  Location: [facility];  Service: General;  Laterality: N/A;   TUBAL LIGATION  2004    Prior to Admission medications   Medication Sig Start Date End Date Taking? Authorizing Provider  cyanocobalamin (VITAMIN B12) 1000 MCG tablet Take 1 tablet (1,000 mcg total) by mouth daily. 05/30/23  Yes Shon Hale, MD  ferrous sulfate 325 (65 FE) MG tablet Take 1 tablet (325 mg total) by mouth daily with breakfast. 06/01/23  Yes Emokpae, Courage, MD  Multiple Vitamin (MULTIVITAMIN WITH MINERALS) TABS tablet Take 1 tablet  by mouth daily. 05/31/23  Yes Emokpae, Courage, MD  pantoprazole (PROTONIX) 40 MG tablet Take 1 tablet (40 mg total) by mouth daily. 05/30/23 05/29/24 Yes Emokpae, Courage, MD  cyanocobalamin (VITAMIN B12) 1000 MCG/ML injection Inject 1 mL (1,000 mcg total) into the muscle once a week for 4 doses. 06/30/23 07/22/23  Cindie Crumbly, MD  simethicone (MYLICON) 80 MG chewable tablet Chew 80 mg by mouth every 6 (six) hours as needed for flatulence. Not taking    [provider]    Allergies as of 06/06/2023   (No Known Allergies)    Family History  Problem Relation Age of Onset   Cancer Father        throat   Hypertension Mother    COPD Mother    HIV Mother    Heart failure Maternal Grandfather    Heart failure Maternal Grandmother    Stroke Maternal Grandmother    Hypertension Maternal Grandmother    Osteoarthritis Maternal Grandmother    Diabetes Sister     Social History   Socioeconomic History   Marital status: Married    Spouse name: Not on file   Number of children: Not on file   Years of education: Not on file   Highest education level: Not on file  Occupational History   Not on file  Tobacco Use   Smoking status: Former    Current packs/day: 0.00    Average packs/day: 1 pack/day for 4.0 years (4.0 ttl pk-yrs)    Types: Cigarettes  Start date: 05/17/1991    Quit date: 05/17/1995    Years since quitting: 28.1   Smokeless tobacco: Never  Vaping Use   Vaping status: Never Used  Substance and Sexual Activity   Alcohol use: Yes    Alcohol/week: 0.0 standard drinks of alcohol    Comment: very rare   Drug use: Yes    Frequency: 3.0 times per week    Types: Marijuana    Comment: 3-4 per week   Sexual activity: Yes    Birth control/protection: Surgical    Comment: tubal  Other Topics Concern   Not on file  Social History Narrative   ** Merged History Encounter **       Social Drivers of Health   Financial Resource Strain: Not on file  Food Insecurity: No  Food Insecurity (05/29/2023)   Hunger Vital Sign    Worried About Running Out of Food in the Last Year: Never true    Ran Out of Food in the Last Year: Never true  Transportation Needs: No Transportation Needs (05/29/2023)   PRAPARE - Administrator, Civil Service (Medical): No    Lack of Transportation (Non-Medical): No  Physical Activity: Not on file  Stress: Not on file  Social Connections: Not on file  Intimate Partner Violence: Not At Risk (05/29/2023)   Humiliation, Afraid, Rape, and Kick questionnaire    Fear of Current or Ex-Partner: No    Emotionally Abused: No    Physically Abused: No    Sexually Abused: No    Review of Systems: See HPI, otherwise negative ROS  Physical Exam: Vital signs in last 24 hours: Temp:  [98.6 F (37 C)] 98.6 F (37 C) (02/17 0722) Pulse Rate:  [59-60] 59 (02/17 0730) Resp:  [12-17] 17 (02/17 0730) BP: (117)/(78) 117/78 (02/17 0722) SpO2:  [100 %] 100 % (02/17 0730) Weight:  [73.2 kg] 73.2 kg (02/17 0722)   General:   Alert,  Well-developed, well-nourished, pleasant and cooperative in NAD Head:  Normocephalic and atraumatic. Eyes:  Sclera clear, no icterus.   Conjunctiva pink. Ears:  Normal auditory acuity. Nose:  No deformity, discharge,  or lesions. Msk:  Symmetrical without gross deformities. Normal posture. Extremities:  Without clubbing or edema. Neurologic:  Alert and  oriented x4;  grossly normal neurologically. Skin:  Intact without significant lesions or rashes. Psych:  Alert and cooperative. Normal mood and affect.  Impression/Plan: Heather Hardin is here for a colonoscopy to be performed for profound iron deficiency anemia   The risks of the procedure including infection, bleed, or perforation as well as benefits, limitations, alternatives and imponderables have been reviewed with the patient. Questions have been answered. All parties agreeable.

## 2023-07-04 ENCOUNTER — Encounter (HOSPITAL_COMMUNITY): Payer: Self-pay | Admitting: Internal Medicine

## 2023-07-07 ENCOUNTER — Inpatient Hospital Stay: Payer: Medicaid Other

## 2023-07-07 VITALS — BP 111/72 | HR 53 | Temp 97.5°F | Resp 18

## 2023-07-07 DIAGNOSIS — D500 Iron deficiency anemia secondary to blood loss (chronic): Secondary | ICD-10-CM

## 2023-07-07 DIAGNOSIS — Z9884 Bariatric surgery status: Secondary | ICD-10-CM | POA: Diagnosis not present

## 2023-07-07 DIAGNOSIS — D509 Iron deficiency anemia, unspecified: Secondary | ICD-10-CM | POA: Diagnosis not present

## 2023-07-07 DIAGNOSIS — E538 Deficiency of other specified B group vitamins: Secondary | ICD-10-CM | POA: Diagnosis not present

## 2023-07-07 DIAGNOSIS — N921 Excessive and frequent menstruation with irregular cycle: Secondary | ICD-10-CM | POA: Diagnosis not present

## 2023-07-07 DIAGNOSIS — Z79899 Other long term (current) drug therapy: Secondary | ICD-10-CM | POA: Diagnosis not present

## 2023-07-07 MED ORDER — ACETAMINOPHEN 325 MG PO TABS
650.0000 mg | ORAL_TABLET | Freq: Once | ORAL | Status: AC
  Administered 2023-07-07: 650 mg via ORAL
  Filled 2023-07-07: qty 2

## 2023-07-07 MED ORDER — CETIRIZINE HCL 10 MG PO TABS
10.0000 mg | ORAL_TABLET | Freq: Once | ORAL | Status: AC
Start: 2023-07-07 — End: 2023-07-07
  Administered 2023-07-07: 10 mg via ORAL
  Filled 2023-07-07: qty 1

## 2023-07-07 MED ORDER — SODIUM CHLORIDE 0.9 % IV SOLN
510.0000 mg | Freq: Once | INTRAVENOUS | Status: AC
  Administered 2023-07-07: 510 mg via INTRAVENOUS
  Filled 2023-07-07: qty 510

## 2023-07-07 MED ORDER — SODIUM CHLORIDE 0.9 % IV SOLN: INTRAVENOUS | Status: DC

## 2023-07-07 NOTE — Progress Notes (Signed)
 Patient presents today for iron infusion. Patient is in satisfactory condition with no new complaints voiced.  Vital signs are stable.  We will proceed with infusion per provider orders.    Peripheral IV started with good blood return pre and post infusion.  Feraheme 510 mg given today per MD orders. Tolerated infusion without adverse affects. Vital signs stable. No complaints at this time. Discharged from clinic ambulatory in stable condition. Alert and oriented x 3. F/U with [facility] as scheduled.

## 2023-07-07 NOTE — Patient Instructions (Signed)

## 2023-07-08 NOTE — Anesthesia Postprocedure Evaluation (Signed)
 Anesthesia Post Note  Patient: Heather Hardin  Procedure(s) Performed: COLONOSCOPY WITH PROPOFOL  Patient location during evaluation: Phase II Anesthesia Type: General Level of consciousness: awake Pain management: pain level controlled Vital Signs Assessment: post-procedure vital signs reviewed and stable Respiratory status: spontaneous breathing and respiratory function stable Cardiovascular status: blood pressure returned to baseline and stable Postop Assessment: no headache and no apparent nausea or vomiting Anesthetic complications: no Comments: Late entry   No notable events documented.   Last Vitals:  Vitals:   07/03/23 0730 07/03/23 0938  BP:  (!) 103/53  Pulse: (!) 59 (!) 56  Resp: 17 12  Temp:  36.7 C  SpO2: 100% 100%    Last Pain:  Vitals:   07/04/23 1117  TempSrc:   PainSc: 0-No pain                 Windell Norfolk

## 2023-07-15 DIAGNOSIS — Z419 Encounter for procedure for purposes other than remedying health state, unspecified: Secondary | ICD-10-CM | POA: Diagnosis not present

## 2023-07-19 ENCOUNTER — Encounter: Payer: Self-pay | Admitting: Internal Medicine

## 2023-07-19 ENCOUNTER — Telehealth: Payer: Self-pay | Admitting: *Deleted

## 2023-07-19 ENCOUNTER — Ambulatory Visit: Payer: Medicaid Other | Admitting: Internal Medicine

## 2023-07-19 VITALS — BP 111/68 | HR 56 | Temp 97.7°F | Ht 66.0 in | Wt 169.8 lb

## 2023-07-19 DIAGNOSIS — Z9884 Bariatric surgery status: Secondary | ICD-10-CM

## 2023-07-19 DIAGNOSIS — D509 Iron deficiency anemia, unspecified: Secondary | ICD-10-CM

## 2023-07-19 DIAGNOSIS — E538 Deficiency of other specified B group vitamins: Secondary | ICD-10-CM

## 2023-07-19 DIAGNOSIS — N920 Excessive and frequent menstruation with regular cycle: Secondary | ICD-10-CM

## 2023-07-19 DIAGNOSIS — D500 Iron deficiency anemia secondary to blood loss (chronic): Secondary | ICD-10-CM

## 2023-07-19 NOTE — Telephone Encounter (Signed)
 PA submitted via [employer] for givens capsule  Reference Number [id number]

## 2023-07-19 NOTE — Patient Instructions (Signed)
 We will schedule for capsule endoscopy to complete your GI workup.  If unremarkable, I think we can safely say you are not losing blood in your GI tract.  We will call with results.  Continue on iron supplementation.  Continue follow-up with hematology.  It was very nice seeing you again today.  Dr. Marletta Lor

## 2023-07-19 NOTE — Progress Notes (Signed)
 Referring Provider: [name], FNP Primary Care Physician:  [name], FNP Primary GI:  Dr. Marletta Lor  Chief Complaint  Patient presents with   Hospitalization Follow-up    Hospital follow up on IDA. Sees hematology 3/18 and having labs for hematology on the 11th. States she is feeling better and not having any dizziness, sob or fatigue. Not seeing any bleeding. Taking iron tablet every other day. Has had two iron infusions.     HPI:   Heather Hardin is a 48 y.o. female who presents to clinic today for hospital follow-up visit.  Iron deficiency anemia: Presented to [facility] 05/29/2023 with dyspnea, back pain, generalized weakness/fatigue.  Hemoglobin on arrival 4.3, iron 9, ferritin 1.  Heme-negative stool.  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  Gastric biopsies unremarkable.  Inpatient colonoscopy attempted though poor prep.  Received total of 3 units PRBCs, discharge 05/30/2023 with hemoglobin 7.5.  Following with hematology, currently receiving oral iron every other day. Has received 2 iron infusions as well.  Outpatient colonoscopy 07/03/2023 with internal hemorrhoids.  Otherwise unremarkable.  Does have history of menorrhagia, she is supposed to call her gynecologist to set up appt but has not yet.   Blood work 06/29/2023 with hemoglobin 9.0  Today, reports she is doing well.  No melena hematochezia.  No dyspnea, weakness, fatigue, or dizziness.  No complaints.  Past Medical History:  Diagnosis Date   Anemia    in youth ; sometimes iron has been low    Depression    Family history of adverse reaction to anesthesia    reports son is slow to wake    GERD (gastroesophageal reflux disease)    Hypertension    PONV (postoperative nausea and vomiting)    Sleep apnea    uses CPAP occ    Thyroid disease     Past Surgical History:  Procedure Laterality Date   BIOPSY  05/30/2023   Procedure: BIOPSY;   Surgeon: Dolores Frame, MD;  [facility];  Service: Gastroenterology;;   CESAREAN SECTION  1997, 1999, 2004   COLONOSCOPY WITH PROPOFOL N/A 05/30/2023   Procedure: COLONOSCOPY WITH PROPOFOL;  Surgeon: Dolores Frame, MD;  [facility];  Service: Gastroenterology;  Laterality: N/A;   COLONOSCOPY WITH PROPOFOL N/A 07/03/2023   Procedure: COLONOSCOPY WITH PROPOFOL;  Surgeon: Lanelle Bal, DO;  [facility];  Service: Endoscopy;  Laterality: N/A;  830am, asa 3   ESOPHAGOGASTRODUODENOSCOPY (EGD) WITH PROPOFOL N/A 05/30/2023   Procedure: ESOPHAGOGASTRODUODENOSCOPY (EGD) WITH PROPOFOL;  Surgeon: Dolores Frame, MD;  [facility];  Service: Gastroenterology;  Laterality: N/A;   GASTRIC ROUX-EN-Y N/A 10/23/2017   Procedure: LAPAROSCOPIC ROUX-EN-Y GASTRIC BYPASS WITH UPPER ENDOSCOPY, ERAS Pathway;  Surgeon: Glenna Fellows, MD;  Location: [facility];  Service: General;  Laterality: N/A;   TUBAL LIGATION  2004    Current Outpatient Medications  Medication Sig Dispense Refill   ferrous sulfate 325 (65 FE) MG tablet Take 1 tablet (325 mg total) by mouth daily with breakfast. 30 tablet 5   Multiple Vitamin (MULTIVITAMIN WITH MINERALS) TABS tablet Take 1 tablet by mouth daily.     OVER THE COUNTER MEDICATION Vit b12 5,000 mcg daily     pantoprazole (PROTONIX) 40 MG tablet Take 1 tablet (40 mg total) by mouth daily. 30 tablet 1   zinc gluconate 50 MG tablet Take 50 mg by mouth daily.  No current facility-administered medications for this visit.    Allergies as of 07/19/2023   (No Known Allergies)    Family History  Problem Relation Age of Onset   Cancer Father        throat   Hypertension Mother    COPD Mother    HIV Mother    Heart failure Maternal Grandfather    Heart failure Maternal Grandmother    Stroke Maternal Grandmother    Hypertension Maternal Grandmother    Osteoarthritis Maternal Grandmother     Diabetes Sister     Social History   Socioeconomic History   Marital status: Married    Spouse name: Not on file   Number of children: Not on file   Years of education: Not on file   Highest education level: Not on file  Occupational History   Not on file  Tobacco Use   Smoking status: Former    Current packs/day: 0.00    Average packs/day: 1 pack/day for 4.0 years (4.0 ttl pk-yrs)    Types: Cigarettes    Start date: 05/17/1991    Quit date: 05/17/1995    Years since quitting: 28.1   Smokeless tobacco: Never  Vaping Use   Vaping status: Never Used  Substance and Sexual Activity   Alcohol use: Yes    Alcohol/week: 0.0 standard drinks of alcohol    Comment: very rare   Drug use: Yes    Frequency: 3.0 times per week    Types: Marijuana    Comment: 3-4 per week   Sexual activity: Yes    Birth control/protection: Surgical    Comment: tubal  Other Topics Concern   Not on file  Social History Narrative   ** Merged History Encounter **       Social Drivers of Health   Financial Resource Strain: Not on file  Food Insecurity: No Food Insecurity (05/29/2023)   Hunger Vital Sign    Worried About Running Out of Food in the Last Year: Never true    Ran Out of Food in the Last Year: Never true  Transportation Needs: No Transportation Needs (05/29/2023)   PRAPARE - Administrator, Civil Service (Medical): No    Lack of Transportation (Non-Medical): No  Physical Activity: Not on file  Stress: Not on file  Social Connections: Not on file    Subjective: Review of Systems  Constitutional:  Negative for chills and fever.  HENT:  Negative for congestion and hearing loss.   Eyes:  Negative for blurred vision and double vision.  Respiratory:  Negative for cough and shortness of breath.   Cardiovascular:  Negative for chest pain and palpitations.  Gastrointestinal:  Negative for abdominal pain, blood in stool, constipation, diarrhea, heartburn, melena and vomiting.   Genitourinary:  Negative for dysuria and urgency.  Musculoskeletal:  Negative for joint pain and myalgias.  Skin:  Negative for itching and rash.  Neurological:  Negative for dizziness and headaches.  Psychiatric/Behavioral:  Negative for depression. The patient is not nervous/anxious.      Objective: LMP 06/22/2023  Physical Exam Constitutional:      Appearance: Normal appearance.  HENT:     Head: Normocephalic and atraumatic.  Eyes:     Extraocular Movements: Extraocular movements intact.     Conjunctiva/sclera: Conjunctivae normal.  Cardiovascular:     Rate and Rhythm: Normal rate and regular rhythm.  Pulmonary:     Effort: Pulmonary effort is normal.     Breath sounds: Normal breath  sounds.  Abdominal:     General: Bowel sounds are normal.     Palpations: Abdomen is soft.  Musculoskeletal:        General: No swelling. Normal range of motion.     Cervical back: Normal range of motion and neck supple.  Skin:    General: Skin is warm and dry.     Coloration: Skin is not jaundiced.  Neurological:     General: No focal deficit present.     Mental Status: She is alert and oriented to person, place, and time.  Psychiatric:        Mood and Affect: Mood normal.        Behavior: Behavior normal.      Assessment/Plan:  1.  Iron deficiency anemia-improving, denies gross melena hematochezia.  EGD and colonoscopy largely unremarkable.  Reports she will call her gynecologist to set up appt for menorrhagia.  History of gastric bypass so malabsorption also likely playing a role.  Will order capsule endoscopy to complete GI workup.  Continue follow-up with hematology.  Appreciate their help immensely.  Continue iron supplementation.  If her capsule is unrevealing, I think she can follow-up with GI as needed.   07/19/2023 1:22 PM   Disclaimer: This note was dictated with voice recognition software. Similar sounding words can inadvertently be transcribed and may not be  corrected upon review.

## 2023-07-20 NOTE — Telephone Encounter (Signed)
 PA still pending review.

## 2023-07-24 NOTE — Telephone Encounter (Signed)
 Received PA approval auth# [id number], DOS: 07/19/23-09/14/23  LMTCB for pt and sent mychart message

## 2023-07-25 ENCOUNTER — Inpatient Hospital Stay: Attending: Oncology

## 2023-07-25 ENCOUNTER — Ambulatory Visit: Admitting: Obstetrics & Gynecology

## 2023-07-25 ENCOUNTER — Inpatient Hospital Stay: Payer: Medicaid Other

## 2023-07-25 ENCOUNTER — Other Ambulatory Visit (HOSPITAL_COMMUNITY)
Admission: RE | Admit: 2023-07-25 | Discharge: 2023-07-25 | Disposition: A | Discharge status: Home / Self Care | Source: Ambulatory Visit | Attending: Obstetrics & Gynecology | Admitting: Obstetrics & Gynecology

## 2023-07-25 ENCOUNTER — Encounter: Payer: Self-pay | Admitting: Obstetrics & Gynecology

## 2023-07-25 VITALS — BP 158/79 | HR 80

## 2023-07-25 DIAGNOSIS — N921 Excessive and frequent menstruation with irregular cycle: Secondary | ICD-10-CM

## 2023-07-25 DIAGNOSIS — E538 Deficiency of other specified B group vitamins: Secondary | ICD-10-CM | POA: Insufficient documentation

## 2023-07-25 DIAGNOSIS — D500 Iron deficiency anemia secondary to blood loss (chronic): Secondary | ICD-10-CM

## 2023-07-25 DIAGNOSIS — D509 Iron deficiency anemia, unspecified: Secondary | ICD-10-CM | POA: Diagnosis not present

## 2023-07-25 DIAGNOSIS — D72819 Decreased white blood cell count, unspecified: Secondary | ICD-10-CM | POA: Diagnosis not present

## 2023-07-25 DIAGNOSIS — Z79899 Other long term (current) drug therapy: Secondary | ICD-10-CM | POA: Diagnosis not present

## 2023-07-25 DIAGNOSIS — Z124 Encounter for screening for malignant neoplasm of cervix: Secondary | ICD-10-CM

## 2023-07-25 DIAGNOSIS — Z9884 Bariatric surgery status: Secondary | ICD-10-CM | POA: Diagnosis not present

## 2023-07-25 LAB — COMPREHENSIVE METABOLIC PANEL
ALT: 17 U/L (ref 0–44)
AST: 22 U/L (ref 15–41)
Albumin: 3.8 g/dL (ref 3.5–5.0)
Alkaline Phosphatase: 63 U/L (ref 38–126)
Anion gap: 8 (ref 5–15)
BUN: 9 mg/dL (ref 6–20)
CO2: 24 mmol/L (ref 22–32)
Calcium: 9.4 mg/dL (ref 8.9–10.3)
Chloride: 107 mmol/L (ref 98–111)
Creatinine, Ser: 0.73 mg/dL (ref 0.44–1.00)
GFR, Estimated: >60 mL/min (ref >60)
Glucose, Bld: 101 mg/dL — ABNORMAL HIGH (ref 70–99)
Potassium: 3.9 mmol/L (ref 3.5–5.1)
Sodium: 139 mmol/L (ref 135–145)
Total Bilirubin: 0.3 mg/dL (ref 0.0–1.2)
Total Protein: 6.8 g/dL (ref 6.5–8.1)

## 2023-07-25 LAB — VITAMIN B12: Vitamin B-12: 1710 pg/mL — ABNORMAL HIGH (ref 180–914)

## 2023-07-25 LAB — CBC WITH DIFFERENTIAL/PLATELET
Abs Immature Granulocytes: 0.01 10*3/uL (ref 0.00–0.07)
Basophils Absolute: 0.1 10*3/uL (ref 0.0–0.1)
Basophils Relative: 1 %
Eosinophils Absolute: 0.1 10*3/uL (ref 0.0–0.5)
Eosinophils Relative: 3 %
HCT: 40.6 % (ref 36.0–46.0)
Hemoglobin: 11.9 g/dL — ABNORMAL LOW (ref 12.0–15.0)
Immature Granulocytes: 0 %
Lymphocytes Relative: 26 %
Lymphs Abs: 0.9 10*3/uL (ref 0.7–4.0)
MCH: 26 pg (ref 26.0–34.0)
MCHC: 29.3 g/dL — ABNORMAL LOW (ref 30.0–36.0)
MCV: 88.8 fL (ref 80.0–100.0)
Monocytes Absolute: 0.2 10*3/uL (ref 0.1–1.0)
Monocytes Relative: 7 %
Neutro Abs: 2.2 10*3/uL (ref 1.7–7.7)
Neutrophils Relative %: 63 %
Platelets: 235 10*3/uL (ref 150–400)
RBC: 4.57 MIL/uL (ref 3.87–5.11)
RDW: 25.9 % — ABNORMAL HIGH (ref 11.5–15.5)
Smear Review: ADEQUATE
WBC: 3.5 10*3/uL — ABNORMAL LOW (ref 4.0–10.5)
nRBC: 0 % (ref 0.0–0.2)

## 2023-07-25 LAB — FERRITIN: Ferritin: 78 ng/mL (ref 11–307)

## 2023-07-25 LAB — IRON AND TIBC
Iron: 97 ug/dL (ref 28–170)
Saturation Ratios: 30 % (ref 10.4–31.8)
TIBC: 321 ug/dL (ref 250–450)
UIBC: 224 ug/dL

## 2023-07-25 LAB — FOLATE: Folate: 16.9 ng/mL (ref >5.9)

## 2023-07-25 MED ORDER — MEDROXYPROGESTERONE ACETATE 10 MG PO TABS: ORAL_TABLET | ORAL | 3 refills | Status: DC

## 2023-07-25 NOTE — Progress Notes (Signed)
 Chief Complaint  Patient presents with   Menstrual Problem    Periods are heavy, sometimes skips months.       48 y.o. [id number] Patient's last menstrual period was 06/22/2023. The current method of family planning is tubal ligation.  Outpatient Encounter Medications as of 07/25/2023  Medication Sig Note   ferrous sulfate 325 (65 FE) MG tablet Take 1 tablet (325 mg total) by mouth daily with breakfast. 07/19/2023: One every other day    medroxyPROGESTERone (PROVERA) 10 MG tablet 1 tablet daily the first 10 days of each month    Multiple Vitamin (MULTIVITAMIN WITH MINERALS) TABS tablet Take 1 tablet by mouth daily. 07/19/2023: 50 plus    OVER THE COUNTER MEDICATION Vit b12 5,000 mcg daily    pantoprazole (PROTONIX) 40 MG tablet Take 1 tablet (40 mg total) by mouth daily.    zinc gluconate 50 MG tablet Take 50 mg by mouth daily.    No facility-administered encounter medications on file as of 07/25/2023.    Subjective Pt required pRBC transfusion for severe anemia from menometrorrhagia Irregular for >1 year When bleeds prolonged and heavy Past Medical History:  Diagnosis Date   Anemia    in youth ; sometimes iron has been low    Depression    Family history of adverse reaction to anesthesia    reports son is slow to wake    GERD (gastroesophageal reflux disease)    Hypertension    PONV (postoperative nausea and vomiting)    Sleep apnea    uses CPAP occ    Thyroid disease     Past Surgical History:  Procedure Laterality Date   BIOPSY  05/30/2023   Procedure: BIOPSY;  Surgeon: Dolores Frame, MD;  [facility];  Service: Gastroenterology;;   CESAREAN SECTION  1997, 1999, 2004   COLONOSCOPY WITH PROPOFOL N/A 05/30/2023   Procedure: COLONOSCOPY WITH PROPOFOL;  Surgeon: Dolores Frame, MD;  [facility];  Service: Gastroenterology;  Laterality: N/A;   COLONOSCOPY WITH PROPOFOL N/A 07/03/2023   Procedure: COLONOSCOPY WITH PROPOFOL;   Surgeon: Lanelle Bal, DO;  [facility];  Service: Endoscopy;  Laterality: N/A;  830am, asa 3   ESOPHAGOGASTRODUODENOSCOPY (EGD) WITH PROPOFOL N/A 05/30/2023   Procedure: ESOPHAGOGASTRODUODENOSCOPY (EGD) WITH PROPOFOL;  Surgeon: Dolores Frame, MD;  [facility];  Service: Gastroenterology;  Laterality: N/A;   GASTRIC ROUX-EN-Y N/A 10/23/2017   Procedure: LAPAROSCOPIC ROUX-EN-Y GASTRIC BYPASS WITH UPPER ENDOSCOPY, ERAS Pathway;  Surgeon: Glenna Fellows, MD;  Location: [facility];  Service: General;  Laterality: N/A;   TUBAL LIGATION  2004    OB History     Gravida  4   Para  3   Term  2   Preterm  0   AB  1   Living  3      SAB  0   IAB  0   Ectopic  0   Multiple      Live Births              No Known Allergies  Social History   Socioeconomic History   Marital status: Married    Spouse name: Not on file   Number of children: Not on file   Years of education: Not on file   Highest education level: Not on file  Occupational History   Not on file  Tobacco Use   Smoking status: Former    Current packs/day: 0.00  Average packs/day: 1 pack/day for 4.0 years (4.0 ttl pk-yrs)    Types: Cigarettes    Start date: 05/17/1991    Quit date: 05/17/1995    Years since quitting: 28.2   Smokeless tobacco: Never  Vaping Use   Vaping status: Never Used  Substance and Sexual Activity   Alcohol use: Yes    Alcohol/week: 0.0 standard drinks of alcohol    Comment: very rare   Drug use: Yes    Frequency: 3.0 times per week    Types: Marijuana    Comment: 3-4 per week   Sexual activity: Yes    Birth control/protection: Surgical    Comment: tubal  Other Topics Concern   Not on file  Social History Narrative   ** Merged History Encounter **       Social Drivers of Health   Financial Resource Strain: High Risk (07/25/2023)   Overall Financial Resource Strain (CARDIA)    Difficulty of Paying Living Expenses: Hard  Food  Insecurity: No Food Insecurity (07/25/2023)   Hunger Vital Sign    Worried About Running Out of Food in the Last Year: Never true    Ran Out of Food in the Last Year: Never true  Transportation Needs: No Transportation Needs (07/25/2023)   PRAPARE - Administrator, Civil Service (Medical): No    Lack of Transportation (Non-Medical): No  Physical Activity: Sufficiently Active (07/25/2023)   Exercise Vital Sign    Days of Exercise per Week: 7 days    Minutes of Exercise per Session: 100 min  Stress: Stress Concern Present (07/25/2023)   [employer] of Occupational Health - Occupational Stress Questionnaire    Feeling of Stress : To some extent  Social Connections: Socially Isolated (07/25/2023)   Social Connection and Isolation Panel [NHANES]    Frequency of Communication with Friends and Family: Once a week    Frequency of Social Gatherings with Friends and Family: Never    Attends Religious Services: Never    Diplomatic Services operational officer: No    Attends Engineer, structural: Never    Marital Status: Married    Family History  Problem Relation Age of Onset   Cancer Father        throat   Hypertension Mother    COPD Mother    HIV Mother    Heart failure Maternal Grandfather    Heart failure Maternal Grandmother    Stroke Maternal Grandmother    Hypertension Maternal Grandmother    Osteoarthritis Maternal Grandmother    Diabetes Sister     Medications:       Current Outpatient Medications:    ferrous sulfate 325 (65 FE) MG tablet, Take 1 tablet (325 mg total) by mouth daily with breakfast., Disp: 30 tablet, Rfl: 5   medroxyPROGESTERone (PROVERA) 10 MG tablet, 1 tablet daily the first 10 days of each month, Disp: 30 tablet, Rfl: 3   Multiple Vitamin (MULTIVITAMIN WITH MINERALS) TABS tablet, Take 1 tablet by mouth daily., Disp: , Rfl:    OVER THE COUNTER MEDICATION, Vit b12 5,000 mcg daily, Disp: , Rfl:    pantoprazole (PROTONIX) 40 MG tablet,  Take 1 tablet (40 mg total) by mouth daily., Disp: 30 tablet, Rfl: 1   zinc gluconate 50 MG tablet, Take 50 mg by mouth daily., Disp: , Rfl:   Objective Blood pressure (!) 158/79, pulse 80, last menstrual period 06/22/2023.  General WDWN female NAD Vulva:  normal appearing vulva with no masses,  tenderness or lesions Vagina:  normal mucosa, no discharge Cervix:  Normal no lesions Pap done Uterus:  normal size, contour, position, consistency, mobility, non-tender Adnexa: ovaries:present,  normal adnexa in size, nontender and no masses   Pertinent ROS No burning with urination, frequency or urgency No nausea, vomiting or diarrhea Nor fever chills or other constitutional symptoms   Labs or studies Reviewed recent hospital labs    Impression + Management Plan: Diagnoses this Encounter::   ICD-10-CM   1. Menometrorrhagia  N92.1     2. Iron deficiency anemia due to chronic blood loss  D50.0     3. Routine cervical smear  Z12.4 Cytology - PAP( [place])     Cyclical provera 10 mg monthly for cycle management   Medications prescribed during  this encounter: Meds ordered this encounter  Medications   medroxyPROGESTERone (PROVERA) 10 MG tablet    Sig: 1 tablet daily the first 10 days of each month    Dispense:  30 tablet    Refill:  3    Labs or Scans Ordered during this encounter: No orders of the defined types were placed in this encounter.     Follow up Return in about 7 months (around 02/24/2024) for Follow up, with Dr Despina Hidden.

## 2023-07-27 LAB — CYTOLOGY - PAP
Comment: NEGATIVE
Diagnosis: NEGATIVE
High risk HPV: NEGATIVE

## 2023-08-01 ENCOUNTER — Inpatient Hospital Stay (HOSPITAL_BASED_OUTPATIENT_CLINIC_OR_DEPARTMENT_OTHER): Payer: Medicaid Other | Admitting: Oncology

## 2023-08-01 VITALS — BP 127/76 | HR 66 | Temp 97.2°F | Resp 18 | Wt 169.4 lb

## 2023-08-01 DIAGNOSIS — D500 Iron deficiency anemia secondary to blood loss (chronic): Secondary | ICD-10-CM

## 2023-08-01 DIAGNOSIS — Z79899 Other long term (current) drug therapy: Secondary | ICD-10-CM | POA: Diagnosis not present

## 2023-08-01 DIAGNOSIS — D72819 Decreased white blood cell count, unspecified: Secondary | ICD-10-CM | POA: Diagnosis not present

## 2023-08-01 DIAGNOSIS — E538 Deficiency of other specified B group vitamins: Secondary | ICD-10-CM

## 2023-08-01 DIAGNOSIS — Z9884 Bariatric surgery status: Secondary | ICD-10-CM | POA: Diagnosis not present

## 2023-08-01 DIAGNOSIS — D509 Iron deficiency anemia, unspecified: Secondary | ICD-10-CM | POA: Diagnosis not present

## 2023-08-01 NOTE — Progress Notes (Unsigned)
 [facility] HEMATOLOGY FOLLOW-UP VISIT  Heather Spencer, FNP  REASON FOR FOLLOW-UP: Iron deficiency anemia  ASSESSMENT & PLAN:  Patient is a 48 year old female following for iron deficiency anemia likely secondary to menstrual blood loss   IDA (iron deficiency anemia) Iron deficiency anemia likely secondary to menstrual blood loss.  Significant improvement in iron levels post IV iron therapy.  Colonoscopy and endoscopy did not show any evidence of bleeding, patient is scheduled for capsule endoscopy.  Patient is started on Provera by her gynecologist. -Continue oral iron supplementation every other day  Return to clinic in 3 months with labs.   Vitamin B12 deficiency Low B12 levels, likely due to gastric bypass surgery limiting absorption. -Patient is currently taking oral vitamin B12 supplementation with improvement in levels.  Continue the same   Leukopenia Patient has slight leukopenia which is stable.  Denies fevers, chills, unwanted weight loss, loss of appetite, night sweats, frequent infections. -Continue to monitor for now.  Will consider flow cytometry if it does not improve. -Will recheck in 3 months   Orders Placed This Encounter  Procedures   Ferritin    Standing Status:   Future    Expected Date:   10/30/2023    Expiration Date:   07/31/2024   Folate    Standing Status:   Future    Expected Date:   10/30/2023    Expiration Date:   07/31/2024   Vitamin B12    Standing Status:   Future    Expected Date:   10/30/2023    Expiration Date:   07/31/2024   CBC with Differential/Platelet    Standing Status:   Future    Expected Date:   10/30/2023    Expiration Date:   07/31/2024   Comprehensive metabolic panel    Standing Status:   Future    Expected Date:   10/30/2023    Expiration Date:   07/31/2024   Iron and TIBC    Standing Status:   Future    Expected Date:   10/30/2023    Expiration Date:   07/31/2024    The total time  spent in the appointment was 20 minutes encounter with patients including review of chart and various tests results, discussions about plan of care and coordination of care plan   All questions were answered. The patient knows to call the clinic with any problems, questions or concerns. No barriers to learning was detected.  Cindie Crumbly, MD 3/19/20251:02 PM   SUMMARY OF HEMATOLOGIC HISTORY: Iron deficiency anemia likely secondary to chronic blood loss-menorrhagia -S/p 2 doses of IV Feraheme on 06/29/2018 10/03/2019 2025   INTERVAL HISTORY: Heather Hardin 48 y.o. female is following for iron deficiency anemia likely secondary to chronic blood loss.  She is accompanied by her husband today.She has been referred to a gynecologist who suspects that the patient's issues may be gynecological in nature. The patient has not yet started the Provera but plans to do so in the near future.  In addition to the menstrual irregularities, the patient has been experiencing peeling fingernails and hair loss, which she attributes to iron deficiency. She has been taking over-the-counter vitamin B12 and iron supplements, which have improved her overall well-being and energy levels. She has also noticed an improvement in her anemia symptoms since starting the iron supplements.She denies any symptoms of infection, fever, chills, abdominal pain, or unintentional weight loss. In fact, she has gained weight recently, which she attributes to improved  eating habits.   I have reviewed the past medical history, past surgical history, social history and family history with the patient   ALLERGIES:  has no known allergies.  MEDICATIONS:  Current Outpatient Medications  Medication Sig Dispense Refill   ferrous sulfate 325 (65 FE) MG tablet Take 1 tablet (325 mg total) by mouth daily with breakfast. 30 tablet 5   medroxyPROGESTERone (PROVERA) 10 MG tablet 1 tablet daily the first 10 days of each month 30 tablet 3    Multiple Vitamin (MULTIVITAMIN WITH MINERALS) TABS tablet Take 1 tablet by mouth daily.     OVER THE COUNTER MEDICATION Vit b12 5,000 mcg daily     pantoprazole (PROTONIX) 40 MG tablet Take 1 tablet (40 mg total) by mouth daily. 30 tablet 1   zinc gluconate 50 MG tablet Take 50 mg by mouth daily.     No current facility-administered medications for this visit.     REVIEW OF SYSTEMS:   Constitutional: Denies fevers, chills or night sweats Eyes: Denies blurriness of vision Ears, nose, mouth, throat, and face: Denies mucositis or sore throat Respiratory: Denies cough, dyspnea or wheezes Cardiovascular: Denies palpitation, chest discomfort or lower extremity swelling Gastrointestinal:  Denies nausea, heartburn or change in bowel habits Skin: Denies abnormal skin rashes Lymphatics: Denies new lymphadenopathy or easy bruising Neurological:Denies numbness, tingling or new weaknesses Behavioral/Psych: Mood is stable, no new changes  All other systems were reviewed with the patient and are negative.  PHYSICAL EXAMINATION:   Vitals:   08/01/23 1143  BP: 127/76  Pulse: 66  Resp: 18  Temp: (!) 97.2 F (36.2 C)  SpO2: 99%    GENERAL:alert, no distress and comfortable SKIN: skin color, texture, turgor are normal, no rashes or significant lesions LYMPH:  no palpable lymphadenopathy in the cervical, axillary or inguinal LUNGS: clear to auscultation and percussion with normal breathing effort HEART: regular rate & rhythm and no murmurs and no lower extremity edema ABDOMEN:abdomen soft, non-tender and normal bowel sounds Musculoskeletal:no cyanosis of digits and no clubbing  NEURO: alert & oriented x 3 with fluent speech  LABORATORY DATA:  I have reviewed the data as listed  Lab Results  Component Value Date   WBC 3.5 (L) 07/25/2023   NEUTROABS 2.2 07/25/2023   HGB 11.9 (L) 07/25/2023   HCT 40.6 07/25/2023   MCV 88.8 07/25/2023   PLT 235 07/25/2023       Chemistry       Component Value Date/Time   NA 139 07/25/2023 0931   NA 141 09/11/2017 1653   K 3.9 07/25/2023 0931   CL 107 07/25/2023 0931   CO2 24 07/25/2023 0931   BUN 9 07/25/2023 0931   BUN 9 09/11/2017 1653   CREATININE 0.73 07/25/2023 0931      Component Value Date/Time   CALCIUM 9.4 07/25/2023 0931   ALKPHOS 63 07/25/2023 0931   AST 22 07/25/2023 0931   ALT 17 07/25/2023 0931   BILITOT 0.3 07/25/2023 0931   BILITOT 0.2 09/11/2017 1653      Latest Reference Range & Units 07/25/23 09:31  Iron 28 - 170 ug/dL 97  UIBC ug/dL 295  TIBC 188 - 416 ug/dL 606  Saturation Ratios 10.4 - 31.8 % 30  Ferritin 11 - 307 ng/mL 78  Folate >5.9 ng/mL 16.9  Vitamin B12 180 - 914 pg/mL 1,710 (H)  (H): Data is abnormally high  Colonoscopy: 07/03/2023 Impression:  - Non- bleeding internal hemorrhoids.  - The examined portion of the ileum  was normal.  - The entire examined colon is normal.  - No specimens collected.  Endoscopy: 05/31/2023 Impression:  - Normal esophagus.  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 Gastrojejunal anastomosis characterized by healthy appearing mucosa. Biopsied. - Normal examined jejunum.

## 2023-08-01 NOTE — Patient Instructions (Signed)
 VISIT SUMMARY:  During today's visit, we discussed your ongoing issues with iron deficiency anemia, menstrual irregularities, and related symptoms such as peeling fingernails and hair loss. We also reviewed your recent low white blood cell count and weight gain. You have been taking over-the-counter vitamin B12 and iron supplements, which have improved your overall well-being and energy levels.  YOUR PLAN:  -IRON DEFICIENCY ANEMIA: Iron deficiency anemia is a condition where your body lacks enough iron to produce healthy red blood cells. This can cause fatigue, weakness, and other symptoms. Your symptoms have improved with iron and vitamin B12 supplements. Continue taking your iron supplement every other day as tolerated. We will monitor your white blood cell count and blood work in three months. Additionally, you should start taking biotin to help with hair and nail health.  -MENSTRUAL IRREGULARITIES: Menstrual irregularities refer to changes in your normal menstrual cycle, such as prolonged periods. This can contribute to anemia. Follow the treatment plan provided by your gynecologist and monitor your menstrual cycle, reporting any changes.  -VITAMIN B12 SUPPLEMENTATION: Vitamin B12 is important for nerve function and the production of red blood cells. You have reported improvement with over-the-counter vitamin B12 supplements. Continue taking these supplements as they have been beneficial.  INSTRUCTIONS:  Please follow up in three months for blood work to monitor your white blood cell count and overall health. Continue with the gynecologist's treatment plan and report any changes in your menstrual cycle.

## 2023-08-02 ENCOUNTER — Encounter: Payer: Self-pay | Admitting: Oncology

## 2023-08-02 DIAGNOSIS — D72819 Decreased white blood cell count, unspecified: Secondary | ICD-10-CM | POA: Insufficient documentation

## 2023-08-02 NOTE — Assessment & Plan Note (Signed)
 Patient has slight leukopenia which is stable.  Denies fevers, chills, unwanted weight loss, loss of appetite, night sweats, frequent infections. -Continue to monitor for now.  Will consider flow cytometry if it does not improve. -Will recheck in 3 months

## 2023-08-02 NOTE — Assessment & Plan Note (Signed)
 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 -Patient is currently taking oral vitamin B12 supplementation with improvement in levels.  Continue the same

## 2023-08-02 NOTE — Assessment & Plan Note (Signed)
 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  Significant improvement in iron levels post IV iron therapy.  Colonoscopy and endoscopy did not show any evidence of bleeding, patient is scheduled for capsule endoscopy.  Patient is started on Provera by her gynecologist. -Continue oral iron supplementation every other day  Return to clinic in 3 months with labs.

## 2023-08-09 ENCOUNTER — Encounter (HOSPITAL_COMMUNITY): Payer: Self-pay | Admitting: Internal Medicine

## 2023-08-09 ENCOUNTER — Ambulatory Visit (HOSPITAL_COMMUNITY)
Admission: RE | Admit: 2023-08-09 | Discharge: 2023-08-09 | Disposition: A | Discharge status: Home / Self Care | Source: Ambulatory Visit | Attending: Internal Medicine | Admitting: Internal Medicine

## 2023-08-09 ENCOUNTER — Encounter (HOSPITAL_COMMUNITY)
Admission: RE | Disposition: A | Discharge status: Home / Self Care | Payer: Self-pay | Source: Ambulatory Visit | Attending: Internal Medicine

## 2023-08-09 DIAGNOSIS — D509 Iron deficiency anemia, unspecified: Secondary | ICD-10-CM | POA: Diagnosis not present

## 2023-08-09 DIAGNOSIS — K633 Ulcer of intestine: Secondary | ICD-10-CM | POA: Diagnosis not present

## 2023-08-09 HISTORY — PX: GIVENS CAPSULE STUDY: SHX5432

## 2023-08-09 SURGERY — IMAGING PROCEDURE, GI TRACT, INTRALUMINAL, VIA CAPSULE

## 2023-08-10 ENCOUNTER — Encounter (HOSPITAL_COMMUNITY): Payer: Self-pay | Admitting: Internal Medicine

## 2023-08-12 NOTE — H&P (Signed)
 Patient presenting for capsule endoscopy due to history of iron deficiency anemia.

## 2023-08-15 NOTE — Op Note (Signed)
  Small Bowel Givens Capsule Study Procedure date:  08/09/23  PCP:  Dr. Lendon Colonel, Edilia Bo, FNP  Indication for procedure:  Iron deficiency anemia. Hospitalization at [facility] in January with symptomatic anemia - Hemoglobin on arrival 4.3, iron 9, ferritin 1. Heme-negative stool. Recevied multple transfusions and IV iron infusions. EGD and colonoscopy normal. Proceeding with evaluation of small bowel to rule out GI source. Has had menorrhagia and following with GYN, recently started on Provera.   Patient data:  Wt: 5'6" Ht: 76.8kg  Findings:   Study complete to the cecum.  View intermittently obscured by bile and whisps of mucous.  Mild scattered erythema noted throughout. Couple of small linear erosions without bleeding noted.   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 No active bleeding or recent bleeding identified. Small linear erosion noted. Most recent hemoglobin with hematology on 07/25/23 with improvement (11.9) and has continued on oral iron every other day.  Images briefly reviewed with Dr. Marletta Lor  Recommendations: Continue pantoprazole 40 mg once daily Continue oral iron every other day.  Continue to follow with GYN and hematology.  Labs per hematology.   Brooke Bonito, MSN, APRN, FNP-BC, AGACNP-BC [facility] at [facility]

## 2023-08-18 ENCOUNTER — Telehealth: Payer: Self-pay | Admitting: Gastroenterology

## 2023-08-18 NOTE — Telephone Encounter (Signed)
 Capsule read completed.   Summary/Findings: Study complete. No active bleeding or recent bleeding identified. Small linear erosion noted. Most recent hemoglobin with hematology on 07/25/23 with improvement (11.9) and has continued on oral iron every other day.   Images briefly reviewed with Dr. Marletta Lor   Recommendations: Continue pantoprazole 40 mg once daily Continue oral iron every other day.  Continue to follow with GYN and hematology.  Labs per hematology.   Per Dr. Darolyn Rua previous note - given capsule is unrevealing she can follow up with GI as needed.   Brooke Bonito, MSN, APRN, FNP-BC, AGACNP-BC [facility] at [facility]

## 2023-08-21 ENCOUNTER — Encounter: Payer: Self-pay | Admitting: *Deleted

## 2023-08-21 NOTE — Telephone Encounter (Signed)
 Sent a [facility] geologist .

## 2023-08-22 NOTE — Telephone Encounter (Signed)
 Pt read MyChart message.

## 2023-08-26 DIAGNOSIS — Z419 Encounter for procedure for purposes other than remedying health state, unspecified: Secondary | ICD-10-CM | POA: Diagnosis not present

## 2023-09-25 DIAGNOSIS — Z419 Encounter for procedure for purposes other than remedying health state, unspecified: Secondary | ICD-10-CM | POA: Diagnosis not present

## 2023-10-26 DIAGNOSIS — Z419 Encounter for procedure for purposes other than remedying health state, unspecified: Secondary | ICD-10-CM | POA: Diagnosis not present

## 2023-10-31 ENCOUNTER — Inpatient Hospital Stay: Attending: Oncology

## 2023-10-31 DIAGNOSIS — D500 Iron deficiency anemia secondary to blood loss (chronic): Secondary | ICD-10-CM | POA: Insufficient documentation

## 2023-10-31 LAB — CBC WITH DIFFERENTIAL/PLATELET
Abs Immature Granulocytes: 0.01 10*3/uL (ref 0.00–0.07)
Basophils Absolute: 0.1 10*3/uL (ref 0.0–0.1)
Basophils Relative: 1 %
Eosinophils Absolute: 0 10*3/uL (ref 0.0–0.5)
Eosinophils Relative: 1 %
HCT: 42.7 % (ref 36.0–46.0)
Hemoglobin: 13.9 g/dL (ref 12.0–15.0)
Immature Granulocytes: 0 %
Lymphocytes Relative: 17 %
Lymphs Abs: 1 10*3/uL (ref 0.7–4.0)
MCH: 30.8 pg (ref 26.0–34.0)
MCHC: 32.6 g/dL (ref 30.0–36.0)
MCV: 94.5 fL (ref 80.0–100.0)
Monocytes Absolute: 0.3 10*3/uL (ref 0.1–1.0)
Monocytes Relative: 6 %
Neutro Abs: 4.2 10*3/uL (ref 1.7–7.7)
Neutrophils Relative %: 75 %
Platelets: 282 10*3/uL (ref 150–400)
RBC: 4.52 MIL/uL (ref 3.87–5.11)
RDW: 12.7 % (ref 11.5–15.5)
WBC: 5.5 10*3/uL (ref 4.0–10.5)
nRBC: 0 % (ref 0.0–0.2)

## 2023-10-31 LAB — COMPREHENSIVE METABOLIC PANEL WITH GFR
ALT: 17 U/L (ref 0–44)
AST: 19 U/L (ref 15–41)
Albumin: 3.9 g/dL (ref 3.5–5.0)
Alkaline Phosphatase: 78 U/L (ref 38–126)
Anion gap: 8 (ref 5–15)
BUN: 12 mg/dL (ref 6–20)
CO2: 27 mmol/L (ref 22–32)
Calcium: 9.3 mg/dL (ref 8.9–10.3)
Chloride: 105 mmol/L (ref 98–111)
Creatinine, Ser: 0.78 mg/dL (ref 0.44–1.00)
GFR, Estimated: >60 mL/min (ref >60)
Glucose, Bld: 95 mg/dL (ref 70–99)
Potassium: 4.2 mmol/L (ref 3.5–5.1)
Sodium: 140 mmol/L (ref 135–145)
Total Bilirubin: 0.7 mg/dL (ref 0.0–1.2)
Total Protein: 7 g/dL (ref 6.5–8.1)

## 2023-10-31 LAB — FOLATE: Folate: 26.2 ng/mL (ref >5.9)

## 2023-10-31 LAB — IRON AND TIBC
Iron: 69 ug/dL (ref 28–170)
Saturation Ratios: 18 % (ref 10.4–31.8)
TIBC: 387 ug/dL (ref 250–450)
UIBC: 318 ug/dL

## 2023-10-31 LAB — FERRITIN: Ferritin: 8 ng/mL — ABNORMAL LOW (ref 11–307)

## 2023-10-31 LAB — VITAMIN B12: Vitamin B-12: 3297 pg/mL — ABNORMAL HIGH (ref 180–914)

## 2023-11-07 ENCOUNTER — Inpatient Hospital Stay: Admitting: Oncology

## 2023-11-20 ENCOUNTER — Inpatient Hospital Stay: Attending: Oncology | Admitting: Oncology

## 2023-11-20 VITALS — BP 113/75 | HR 66 | Temp 98.3°F | Resp 18 | Ht 67.0 in | Wt 183.0 lb

## 2023-11-20 DIAGNOSIS — E538 Deficiency of other specified B group vitamins: Secondary | ICD-10-CM

## 2023-11-20 DIAGNOSIS — D509 Iron deficiency anemia, unspecified: Secondary | ICD-10-CM | POA: Insufficient documentation

## 2023-11-20 DIAGNOSIS — D500 Iron deficiency anemia secondary to blood loss (chronic): Secondary | ICD-10-CM | POA: Diagnosis not present

## 2023-11-20 DIAGNOSIS — D72819 Decreased white blood cell count, unspecified: Secondary | ICD-10-CM | POA: Diagnosis not present

## 2023-11-20 DIAGNOSIS — E611 Iron deficiency: Secondary | ICD-10-CM

## 2023-11-20 NOTE — Assessment & Plan Note (Signed)
 Patient has slight leukopenia which is stable.  Denies fevers, chills, unwanted weight loss, loss of appetite, night sweats, frequent infections.  Resolved at this time.

## 2023-11-20 NOTE — Assessment & Plan Note (Signed)
 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 Levels improved with oral vitamin B12 supplementation  - Can discontinue oral vitamin B12 supplementation at this time

## 2023-11-20 NOTE — Assessment & Plan Note (Signed)
 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 Patient is started on Provera  by her gynecologist. Significant improvement in iron levels post IV iron therapy.   Colonoscopy,endoscopy and capsule endoscopy did not show any evidence of bleeding.   Latest labs consistent with iron deficiency with a ferritin of 8 and no anemia  - Will administer 2 more doses of IV iron.  Reemphasized on side effects including allergic reactions, nausea and headache - Continue oral iron supplementation every other day  Return to clinic in 3 months with labs.  If at this point, iron levels are normalized and anemia remains resolved, can discontinue oral iron supplementation and discharge from clinic

## 2023-11-20 NOTE — Progress Notes (Signed)
 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 Patient is started on Provera  by her gynecologist. Significant improvement in iron levels post IV iron therapy.   Colonoscopy,endoscopy and capsule endoscopy did not show any evidence of bleeding.   Latest labs consistent with iron deficiency with a ferritin of 8 and no anemia  - Will administer 2 more doses of IV iron.  Reemphasized on side effects including allergic reactions, nausea and headache - Continue oral iron supplementation every other day  Return to clinic in 3 months with labs.  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 Levels improved with oral vitamin B12 supplementation  - Can discontinue oral vitamin B12 supplementation at this time   Leukopenia Patient has slight leukopenia which is stable.  Denies fevers, chills, unwanted weight loss, loss of appetite, night sweats, frequent infections.  Resolved at this time.    Orders Placed This Encounter  Procedures   CBC with Differential/Platelet    Standing Status:   Future    Expected Date:   02/19/2024    Expiration Date:   05/19/2024   Comprehensive metabolic panel with GFR    Standing Status:   Future    Expected Date:   02/19/2024    Expiration Date:   05/19/2024   Ferritin    Standing Status:   Future    Expected Date:   02/19/2024    Expiration Date:   05/19/2024   Folate    Standing Status:   Future    Expected Date:   02/19/2024     Expiration Date:   05/19/2024   Vitamin B12    Standing Status:   Future    Expected Date:   02/19/2024    Expiration Date:   05/19/2024   Iron and TIBC    Standing Status:   Future    Expected Date:   02/19/2024    Expiration Date:   05/19/2024    The total time spent in the appointment was 20 minutes encounter with patients including review of chart and various tests results, discussions about plan of care and coordination of care plan   All questions were answered. The patient knows to call the clinic with any problems, questions or concerns. No barriers to learning was detected.  Heather Hardin, Heather Hardin 7/7/202511:20 AM   SUMMARY OF HEMATOLOGIC HISTORY: Iron deficiency anemia likely secondary to chronic blood loss-menorrhagia -S/p 2 doses of IV Feraheme on 06/29/2018 10/03/2019 2025   INTERVAL HISTORY: Heather Hardin 48 y.o. female is following for iron deficiency anemia likely secondary to chronic blood loss.  She is accompanied by her husband today.She has been referred to a gynecologist who suspects that the patient's issues may be gynecological in nature and was started on Provera .  Colonoscopy, endoscopy and capsule endoscopy were performed which did not show any evidence of bleeding.  No blood in stools or dark-colored stools.  She is taking iron pills every other day and vitamin B12 pills daily.  She has no complaints today.  Overall, is feeling improved.  I have reviewed the past medical history, past surgical history, social history and family history with the patient   ALLERGIES:  has no known allergies.  MEDICATIONS:  Current Outpatient Medications  Medication Sig Dispense Refill   ferrous sulfate 325 (65 FE) MG tablet Take 1 tablet (325 mg total) by mouth daily with breakfast. (Patient taking differently: Take 325 mg by mouth daily with breakfast. Every other day) 30 tablet 5   medroxyPROGESTERone  (PROVERA ) 10 MG tablet 1 tablet daily the first 10 days of each month 30 tablet  3   Multiple Vitamin (MULTIVITAMIN WITH MINERALS) TABS tablet Take 1 tablet by mouth daily.     OVER THE COUNTER MEDICATION Vit b12 5,000 mcg daily     pantoprazole  (PROTONIX ) 40 MG tablet Take 1 tablet (40 mg total) by mouth daily. 30 tablet 1   zinc gluconate 50 MG tablet Take 50 mg by mouth daily.     No current facility-administered medications for this visit.     REVIEW OF SYSTEMS:   Constitutional: Denies fevers, chills or night sweats Eyes: Denies blurriness of vision Ears, nose, mouth, throat, and face: Denies mucositis or sore throat Respiratory: Denies cough, dyspnea or wheezes Cardiovascular: Denies palpitation, chest discomfort or lower extremity swelling Gastrointestinal:  Denies nausea, heartburn or change in bowel habits Skin: Denies abnormal skin rashes Lymphatics: Denies new lymphadenopathy or easy bruising Neurological:Denies numbness, tingling or new weaknesses Behavioral/Psych: Mood is stable, no new changes  All other systems were reviewed with the patient and are negative.  PHYSICAL EXAMINATION:   Vitals:   11/20/23 0826  BP: 113/75  Pulse: 66  Resp: 18  Temp: 98.3 F (36.8 C)  SpO2: 100%    GENERAL:alert, no distress and comfortable SKIN: skin color, texture, turgor are normal, no rashes or significant lesions LYMPH:  no palpable lymphadenopathy in the cervical, axillary or inguinal LUNGS: clear to auscultation and percussion with normal breathing effort HEART: regular rate & rhythm and no murmurs and no lower extremity edema ABDOMEN:abdomen soft, non-tender and normal bowel sounds Musculoskeletal:no cyanosis of digits and no clubbing  NEURO: alert & oriented x 3 with fluent speech  LABORATORY DATA:  I have reviewed the data as listed  Lab Results  Component Value Date   WBC 5.5 10/31/2023   NEUTROABS 4.2 10/31/2023   HGB 13.9 10/31/2023   HCT 42.7 10/31/2023   MCV 94.5 10/31/2023   PLT 282 10/31/2023       Chemistry      Component  Value Date/Time   NA 140 10/31/2023 1052   NA 141 09/11/2017 1653   K 4.2 10/31/2023 1052   CL 105 10/31/2023 1052   CO2 27 10/31/2023 1052   BUN 12 10/31/2023 1052   BUN 9 09/11/2017 1653   CREATININE 0.78 10/31/2023 1052      Component Value Date/Time   CALCIUM 9.3 10/31/2023 1052   ALKPHOS 78 10/31/2023 1052   AST 19 10/31/2023 1052   ALT 17 10/31/2023 1052   BILITOT 0.7 10/31/2023 1052   BILITOT 0.2 09/11/2017 1653      Latest Reference Range & Units 10/31/23 10:52  Iron 28 - 170 ug/dL 69  UIBC ug/dL 681  TIBC 749 - 549 ug/dL 612  Saturation Ratios 10.4 - 31.8 % 18  Ferritin 11 - 307 ng/mL 8 (L)  Folate >5.9 ng/mL 26.2  Vitamin B12 180 - 914 pg/mL 3,297 (H)  (L): Data is abnormally low (H): Data is abnormally high   Colonoscopy: 07/03/2023 Impression:  -  Non- bleeding internal hemorrhoids.  - The examined portion of the ileum was normal.  - The entire examined colon is normal.  - No specimens collected.  Endoscopy: 05/31/2023 Impression:  - Normal esophagus.  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 Gastrojejunal anastomosis characterized by healthy appearing mucosa. Biopsied. - Normal examined jejunum.  Capsule endoscopy: 08/18/23 Summary/Findings: Study complete. No active bleeding or recent bleeding identified. Small linear erosion noted.

## 2023-11-23 ENCOUNTER — Inpatient Hospital Stay

## 2023-11-23 VITALS — BP 118/69 | HR 54 | Temp 98.9°F | Resp 18

## 2023-11-23 DIAGNOSIS — D509 Iron deficiency anemia, unspecified: Secondary | ICD-10-CM | POA: Diagnosis not present

## 2023-11-23 DIAGNOSIS — D500 Iron deficiency anemia secondary to blood loss (chronic): Secondary | ICD-10-CM

## 2023-11-23 MED ORDER — SODIUM CHLORIDE 0.9 % IV SOLN
510.0000 mg | Freq: Once | INTRAVENOUS | Status: AC
  Administered 2023-11-23: 510 mg via INTRAVENOUS
  Filled 2023-11-23: qty 510

## 2023-11-23 MED ORDER — SODIUM CHLORIDE 0.9 % IV SOLN: INTRAVENOUS | Status: DC

## 2023-11-23 MED ORDER — CETIRIZINE HCL 10 MG PO TABS
10.0000 mg | ORAL_TABLET | Freq: Once | ORAL | Status: AC
  Administered 2023-11-23: 10 mg via ORAL
  Filled 2023-11-23: qty 1

## 2023-11-23 MED ORDER — ACETAMINOPHEN 325 MG PO TABS
650.0000 mg | ORAL_TABLET | Freq: Once | ORAL | Status: AC
  Administered 2023-11-23: 650 mg via ORAL
  Filled 2023-11-23: qty 2

## 2023-11-23 NOTE — Progress Notes (Signed)
 Patient tolerated iron infusion with no complaints voiced.  Peripheral IV site clean and dry with good blood return noted before and after infusion.  Band aid applied.  VSS with discharge and left in satisfactory condition with no s/s of distress noted.

## 2023-11-23 NOTE — Patient Instructions (Signed)

## 2023-11-25 DIAGNOSIS — Z419 Encounter for procedure for purposes other than remedying health state, unspecified: Secondary | ICD-10-CM | POA: Diagnosis not present

## 2023-11-30 ENCOUNTER — Inpatient Hospital Stay

## 2023-11-30 VITALS — BP 123/80 | HR 58 | Temp 98.2°F | Resp 18

## 2023-11-30 DIAGNOSIS — D509 Iron deficiency anemia, unspecified: Secondary | ICD-10-CM | POA: Diagnosis not present

## 2023-11-30 DIAGNOSIS — D500 Iron deficiency anemia secondary to blood loss (chronic): Secondary | ICD-10-CM

## 2023-11-30 MED ORDER — SODIUM CHLORIDE 0.9 % IV SOLN
510.0000 mg | Freq: Once | INTRAVENOUS | Status: AC
  Administered 2023-11-30: 510 mg via INTRAVENOUS
  Filled 2023-11-30: qty 510

## 2023-11-30 MED ORDER — SODIUM CHLORIDE 0.9 % IV SOLN: INTRAVENOUS | Status: DC

## 2023-11-30 NOTE — Progress Notes (Signed)
 Patient took her pre-meds at home today.   Feraheme iron infusion given per orders. Patient tolerated it well without problems. Vitals stable and discharged home from clinic ambulatory. Follow up as scheduled.

## 2023-11-30 NOTE — Patient Instructions (Signed)

## 2023-12-26 DIAGNOSIS — Z419 Encounter for procedure for purposes other than remedying health state, unspecified: Secondary | ICD-10-CM | POA: Diagnosis not present

## 2024-01-08 ENCOUNTER — Inpatient Hospital Stay
Admission: RE | Admit: 2024-01-08 | Discharge: 2024-01-08 | Disposition: A | Discharge status: Home / Self Care | Payer: Self-pay | Source: Ambulatory Visit | Attending: Adult Health | Admitting: Adult Health

## 2024-01-08 ENCOUNTER — Other Ambulatory Visit: Payer: Self-pay | Admitting: Adult Health

## 2024-01-08 ENCOUNTER — Encounter: Payer: Self-pay | Admitting: Adult Health

## 2024-01-08 ENCOUNTER — Ambulatory Visit: Admitting: Adult Health

## 2024-01-08 VITALS — BP 112/74 | HR 65 | Ht 67.0 in | Wt 189.5 lb

## 2024-01-08 DIAGNOSIS — N6341 Unspecified lump in right breast, subareolar: Secondary | ICD-10-CM

## 2024-01-08 DIAGNOSIS — N644 Mastodynia: Secondary | ICD-10-CM

## 2024-01-08 NOTE — Progress Notes (Signed)
  Subjective:     Patient ID: Heather Hardin, female   DOB: 1975/07/15, 48 y.o.   MRN: [id number]  HPI Heather Hardin is a 48 year old white female, married, [id number] in complaining of right breast pain for about a month and a mass in areola for about 2 weeks, and it is tender too.     Component Value Date/Time   DIAGPAP  07/25/2023 1042    - Negative for intraepithelial lesion or malignancy (NILM)   HPVHIGH Negative 07/25/2023 1042   ADEQPAP  07/25/2023 1042    Satisfactory for evaluation; transformation zone component PRESENT.    PCP is JAYSON Learn NP  Review of Systems +right breast pain for about a month and a mass in areola for about 2 weeks, and it is tender too. Denies injury Reviewed past medical,surgical, social and family history. Reviewed medications and allergies.     Objective:   Physical Exam BP 112/74 (BP Location: Left Arm, Patient Position: Sitting, Cuff Size: Normal)   Pulse 65   Ht 5' 7 (1.702 m)   Wt 189 lb 8 oz (86 kg)   LMP 12/25/2023 (Exact Date)   BMI 29.68 kg/m      Skin warm and dry,  Breasts:no dominate palpable mass, retraction or nipple discharge on the left, on the right, no retraction or nipple discharge, has 1 cm mobile,mass at 5-6 0'clock in areola, that is tender  Upstream - 01/08/24 1157       Pregnancy Intention Screening   Does the patient want to become pregnant in the next year? No    Does the patient's partner want to become pregnant in the next year? No    Would the patient like to discuss contraceptive options today? No      Contraception Wrap Up   Current Method Female Sterilization    End Method Female Sterilization    Contraception Counseling Provided No          Assessment:     1. Breast pain, right (Primary) +right breast pain for about a month Will get diagnostic bilateral mammogram and US  at [facility] 01/23/24 at 11 am  - US  LIMITED ULTRASOUND INCLUDING AXILLA RIGHT BREAST; Future - MM 3D DIAGNOSTIC MAMMOGRAM BILATERAL  BREAST; Future - US  LIMITED ULTRASOUND INCLUDING AXILLA LEFT BREAST ; Future  2. Subareolar mass of right breast Has 1 cm tender,mobile, mass at 5-6 0'clock in areola,  Will get diagnostic bilateral mammogram and US  at [facility] 01/23/24 at 11 am  - US  LIMITED ULTRASOUND INCLUDING AXILLA RIGHT BREAST; Future - MM 3D DIAGNOSTIC MAMMOGRAM BILATERAL BREAST; Future - US  LIMITED ULTRASOUND INCLUDING AXILLA LEFT BREAST ; Future     Plan:     Follow up prn

## 2024-01-18 ENCOUNTER — Ambulatory Visit (INDEPENDENT_AMBULATORY_CARE_PROVIDER_SITE_OTHER): Payer: Self-pay | Admitting: Nurse Practitioner

## 2024-01-18 VITALS — BP 111/76 | HR 67 | Ht 67.0 in | Wt 190.0 lb

## 2024-01-18 DIAGNOSIS — L989 Disorder of the skin and subcutaneous tissue, unspecified: Secondary | ICD-10-CM

## 2024-01-18 DIAGNOSIS — E782 Mixed hyperlipidemia: Secondary | ICD-10-CM

## 2024-01-18 DIAGNOSIS — F411 Generalized anxiety disorder: Secondary | ICD-10-CM

## 2024-01-18 DIAGNOSIS — I10 Essential (primary) hypertension: Secondary | ICD-10-CM

## 2024-01-18 MED ORDER — ESCITALOPRAM OXALATE 20 MG PO TABS: 20.0000 mg | ORAL_TABLET | Freq: Every day | ORAL | 1 refills | Status: AC

## 2024-01-18 NOTE — Progress Notes (Signed)
 Established Patient Office Visit  Subjective:  Patient ID: Heather Hardin, female    DOB: 04-01-1976  Age: 48 y.o. MRN: [id number]  Chief Complaint  Patient presents with   Establish Care    Patient is switching providers due to location.  Recent cancer center labs due to low iron.  Patient already has her diag mammo scheduled on Sept 9.  Eyes last examined 18 months ago.  Last colonoscopy 2025, gastric bypass patient 6 years.  Patient has two cysts on scalp, will refer to Derm.  Patient has anxiety, unhappiness, stressed.  Patient was on lexapro .      No other concerns at this time.   Past Medical History:  Diagnosis Date   Anemia    in youth ; sometimes iron has been low    Depression    Family history of adverse reaction to anesthesia    reports son is slow to wake    GERD (gastroesophageal reflux disease)    Hypertension    PONV (postoperative nausea and vomiting)    Sleep apnea    uses CPAP occ    Thyroid  disease     Past Surgical History:  Procedure Laterality Date   BIOPSY  05/30/2023   Procedure: BIOPSY;  Surgeon: Eartha Angelia Sieving, MD;  [facility];  Service: Gastroenterology;;   CESAREAN SECTION  1997, 1999, 2004   COLONOSCOPY WITH PROPOFOL  N/A 05/30/2023   Procedure: COLONOSCOPY WITH PROPOFOL ;  Surgeon: Eartha Angelia Sieving, MD;  [facility];  Service: Gastroenterology;  Laterality: N/A;   COLONOSCOPY WITH PROPOFOL  N/A 07/03/2023   Procedure: COLONOSCOPY WITH PROPOFOL ;  Surgeon: Cindie Carlin POUR, DO;  [facility];  Service: Endoscopy;  Laterality: N/A;  830am, asa 3   ESOPHAGOGASTRODUODENOSCOPY (EGD) WITH PROPOFOL  N/A 05/30/2023   Procedure: ESOPHAGOGASTRODUODENOSCOPY (EGD) WITH PROPOFOL ;  Surgeon: Eartha Angelia Sieving, MD;  [facility];  Service: Gastroenterology;  Laterality: N/A;   GASTRIC ROUX-EN-Y N/A 10/23/2017   Procedure: LAPAROSCOPIC ROUX-EN-Y GASTRIC BYPASS WITH UPPER ENDOSCOPY, ERAS  Pathway;  Surgeon: Mikell Katz, MD;  Location: [facility];  Service: General;  Laterality: N/A;   GIVENS CAPSULE STUDY N/A 08/09/2023   Procedure: IMAGING PROCEDURE, GI TRACT, INTRALUMINAL, VIA CAPSULE;  Surgeon: Cindie Carlin POUR, DO;  [facility];  Service: Endoscopy;  Laterality: N/A;  830am   TUBAL LIGATION  2004    Social History   Socioeconomic History   Marital status: Married    Spouse name: Not on file   Number of children: Not on file   Years of education: Not on file   Highest education level: Associate degree: academic program  Occupational History   Not on file  Tobacco Use   Smoking status: Former    Current packs/day: 0.00    Average packs/day: 1 pack/day for 4.0 years (4.0 ttl pk-yrs)    Types: Cigarettes    Start date: 05/17/1991    Quit date: 05/17/1995    Years since quitting: 28.6   Smokeless tobacco: Never  Vaping Use   Vaping status: Never Used  Substance and Sexual Activity   Alcohol use: Yes    Alcohol/week: 0.0 standard drinks of alcohol    Comment: very rare   Drug use: Yes    Frequency: 3.0 times per week    Types: Marijuana    Comment: occ   Sexual activity: Not Currently    Birth control/protection: Surgical    Comment: tubal  Other Topics Concern   Not  on file  Social History Narrative   ** Merged History Encounter **       Social Drivers of Health   Financial Resource Strain: Medium Risk (01/18/2024)   Overall Financial Resource Strain (CARDIA)    Difficulty of Paying Living Expenses: Somewhat hard  Food Insecurity: No Food Insecurity (01/18/2024)   Hunger Vital Sign    Worried About Running Out of Food in the Last Year: Never true    Ran Out of Food in the Last Year: Never true  Transportation Needs: No Transportation Needs (01/18/2024)   PRAPARE - Administrator, Civil Service (Medical): No    Lack of Transportation (Non-Medical): No  Physical Activity: Sufficiently Active (01/18/2024)   Exercise Vital Sign     Days of Exercise per Week: 7 days    Minutes of Exercise per Session: 30 min  Stress: Stress Concern Present (01/18/2024)   [employer] of Occupational Health - Occupational Stress Questionnaire    Feeling of Stress: Rather much  Social Connections: Socially Isolated (01/18/2024)   Social Connection and Isolation Panel    Frequency of Communication with Friends and Family: Never    Frequency of Social Gatherings with Friends and Family: Never    Attends Religious Services: Never    Database administrator or Organizations: No    Attends Engineer, structural: Not on file    Marital Status: Married  Catering manager Violence: Not At Risk (07/25/2023)   Humiliation, Afraid, Rape, and Kick questionnaire    Fear of Current or Ex-Partner: No    Emotionally Abused: No    Physically Abused: No    Sexually Abused: No    Family History  Problem Relation Age of Onset   Cancer Father        throat   Hypertension Mother    COPD Mother    HIV Mother    Heart failure Maternal Grandfather    Heart failure Maternal Grandmother    Stroke Maternal Grandmother    Hypertension Maternal Grandmother    Osteoarthritis Maternal Grandmother    Diabetes Sister     No Known Allergies  Outpatient Medications Prior to Visit  Medication Sig   Acetaminophen  (TYLENOL  PO) Take by mouth.   BIOTIN PO Take by mouth.   ferrous sulfate 325 (65 FE) MG tablet Take 1 tablet (325 mg total) by mouth daily with breakfast.   medroxyPROGESTERone  (PROVERA ) 10 MG tablet 1 tablet daily the first 10 days of each month   Multiple Vitamin (MULTIVITAMIN WITH MINERALS) TABS tablet Take 1 tablet by mouth daily.   OVER THE COUNTER MEDICATION Vit b12 5,000 mcg daily (Patient taking differently: Vit b12 5,000 mcg daily prn)   zinc gluconate 50 MG tablet Take 50 mg by mouth daily.   No facility-administered medications prior to visit.    ROS     Objective:   BP 111/76   Pulse 67   Ht 5' 7 (1.702 m)   Wt  190 lb (86.2 kg)   LMP 12/25/2023 (Exact Date)   SpO2 98%   BMI 29.76 kg/m   Vitals:   01/18/24 1259  BP: 111/76  Pulse: 67  Height: 5' 7 (1.702 m)  Weight: 190 lb (86.2 kg)  SpO2: 98%  BMI (Calculated): 29.75    Physical Exam Vitals and nursing note reviewed.  Constitutional:      Appearance: Normal appearance.  HENT:     Head: Normocephalic.     Comments: Two palpable cyst like  structures    Nose: Nose normal.     Mouth/Throat:     Mouth: Mucous membranes are moist.  Cardiovascular:     Rate and Rhythm: Normal rate and regular rhythm.     Pulses: Normal pulses.     Heart sounds: Normal heart sounds.  Pulmonary:     Effort: Pulmonary effort is normal.     Breath sounds: Normal breath sounds.  Musculoskeletal:        General: Tenderness present.     Cervical back: Normal range of motion and neck supple.  Skin:    General: Skin is warm and dry.  Neurological:     Mental Status: She is alert and oriented to person, place, and time.  Psychiatric:        Mood and Affect: Mood normal.        Behavior: Behavior normal.      No results found for any visits on 01/18/24.  Recent Results (from the past 2160 hours)  Ferritin     Status: Abnormal   Collection Time: 10/31/23 10:52 AM  Result Value Ref Range   Ferritin 8 (L) 11 - 307 ng/mL    Comment: Performed at [facility], [address], [place], [place] [address]  Folate     Status: None   Collection Time: 10/31/23 10:52 AM  Result Value Ref Range   Folate 26.2 >5.9 ng/mL    Comment: RESULT CONFIRMED BY MANUAL DILUTION Performed at [facility], [address] [place], [place] [address]   Vitamin B12     Status: Abnormal   Collection Time: 10/31/23 10:52 AM  Result Value Ref Range   Vitamin B-12 3,297 (H) 180 - 914 pg/mL    Comment: RESULT CONFIRMED BY MANUAL DILUTION (NOTE) This assay is not validated for testing neonatal or myeloproliferative syndrome specimens for Vitamin B12 levels. Performed at  [facility] [address], [place], [place] [address]   CBC with Differential/Platelet     Status: None   Collection Time: 10/31/23 10:52 AM  Result Value Ref Range   WBC 5.5 4.0 - 10.5 K/uL   RBC 4.52 3.87 - 5.11 MIL/uL   Hemoglobin 13.9 12.0 - 15.0 g/dL   HCT 57.2 63.9 - 53.9 %   MCV 94.5 80.0 - 100.0 fL   MCH 30.8 26.0 - 34.0 pg   MCHC 32.6 30.0 - 36.0 g/dL   RDW 87.2 88.4 - 84.4 %   Platelets 282 150 - 400 K/uL   nRBC 0.0 0.0 - 0.2 %   Neutrophils Relative % 75 %   Neutro Abs 4.2 1.7 - 7.7 K/uL   Lymphocytes Relative 17 %   Lymphs Abs 1.0 0.7 - 4.0 K/uL   Monocytes Relative 6 %   Monocytes Absolute 0.3 0.1 - 1.0 K/uL   Eosinophils Relative 1 %   Eosinophils Absolute 0.0 0.0 - 0.5 K/uL   Basophils Relative 1 %   Basophils Absolute 0.1 0.0 - 0.1 K/uL   Immature Granulocytes 0 %   Abs Immature Granulocytes 0.01 0.00 - 0.07 K/uL    Comment: Performed at [facility], [address], [place], [place] [address]  Comprehensive metabolic panel     Status: None   Collection Time: 10/31/23 10:52 AM  Result Value Ref Range   Sodium 140 135 - 145 mmol/L   Potassium 4.2 3.5 - 5.1 mmol/L   Chloride 105 98 - 111 mmol/L   CO2 27 22 - 32 mmol/L   Glucose, Bld 95 70 -  99 mg/dL    Comment: Glucose reference range applies only to samples taken after fasting for at least 8 hours.   BUN 12 6 - 20 mg/dL   Creatinine, Ser 9.21 0.44 - 1.00 mg/dL   Calcium 9.3 8.9 - 89.6 mg/dL   Total Protein 7.0 6.5 - 8.1 g/dL   Albumin 3.9 3.5 - 5.0 g/dL   AST 19 15 - 41 U/L   ALT 17 0 - 44 U/L   Alkaline Phosphatase 78 38 - 126 U/L   Total Bilirubin 0.7 0.0 - 1.2 mg/dL   GFR, Estimated >39 >39 mL/min    Comment: (NOTE) Calculated using the CKD-EPI Creatinine Equation (2021)    Anion gap 8 5 - 15    Comment: Performed at [facility], [address], [place], [place] [address]  Iron and TIBC     Status: None   Collection Time: 10/31/23 10:52 AM  Result Value Ref Range   Iron 69 28 - 170 ug/dL    TIBC 612 749 - 549 ug/dL   Saturation Ratios 18 10.4 - 31.8 %   UIBC 318 ug/dL    Comment: Performed at [facility], [address] [place], [place] [address]      Assessment & Plan:   Problem List Items Addressed This Visit   None   No follow-ups on file.   Total time spent: 25 minutes  Neale Carpen, NP  01/18/2024   This document may have been prepared by [facility] Voice Recognition software and as such may include unintentional dictation errors.

## 2024-01-18 NOTE — Patient Instructions (Signed)
 1) Skin lesions to scalp - Derm referral  2) Anxiety- Starting lexapro  10 mg daily 3) Pain lower back into hip - stretches and magnesium, topical NSAIDs 4) Follow up appt in 6 weeks and fasting labs prior

## 2024-01-23 ENCOUNTER — Encounter (HOSPITAL_COMMUNITY): Payer: Self-pay

## 2024-01-23 ENCOUNTER — Ambulatory Visit (HOSPITAL_COMMUNITY)
Admission: RE | Admit: 2024-01-23 | Discharge: 2024-01-23 | Disposition: A | Discharge status: Home / Self Care | Source: Ambulatory Visit | Attending: Adult Health | Admitting: Adult Health

## 2024-01-23 ENCOUNTER — Ambulatory Visit: Payer: Self-pay | Admitting: Adult Health

## 2024-01-23 DIAGNOSIS — N6341 Unspecified lump in right breast, subareolar: Secondary | ICD-10-CM

## 2024-01-23 DIAGNOSIS — N644 Mastodynia: Secondary | ICD-10-CM | POA: Insufficient documentation

## 2024-01-23 DIAGNOSIS — R92323 Mammographic fibroglandular density, bilateral breasts: Secondary | ICD-10-CM | POA: Diagnosis not present

## 2024-01-23 DIAGNOSIS — N6315 Unspecified lump in the right breast, overlapping quadrants: Secondary | ICD-10-CM | POA: Diagnosis not present

## 2024-01-23 DIAGNOSIS — R928 Other abnormal and inconclusive findings on diagnostic imaging of breast: Secondary | ICD-10-CM | POA: Diagnosis not present

## 2024-01-26 DIAGNOSIS — Z419 Encounter for procedure for purposes other than remedying health state, unspecified: Secondary | ICD-10-CM | POA: Diagnosis not present

## 2024-02-01 ENCOUNTER — Ambulatory Visit (INDEPENDENT_AMBULATORY_CARE_PROVIDER_SITE_OTHER)

## 2024-02-01 DIAGNOSIS — L814 Other melanin hyperpigmentation: Secondary | ICD-10-CM

## 2024-02-01 DIAGNOSIS — L905 Scar conditions and fibrosis of skin: Secondary | ICD-10-CM

## 2024-02-01 DIAGNOSIS — L578 Other skin changes due to chronic exposure to nonionizing radiation: Secondary | ICD-10-CM | POA: Diagnosis not present

## 2024-02-01 DIAGNOSIS — L821 Other seborrheic keratosis: Secondary | ICD-10-CM

## 2024-02-01 DIAGNOSIS — Z1283 Encounter for screening for malignant neoplasm of skin: Secondary | ICD-10-CM | POA: Diagnosis not present

## 2024-02-01 DIAGNOSIS — W908XXA Exposure to other nonionizing radiation, initial encounter: Secondary | ICD-10-CM

## 2024-02-01 DIAGNOSIS — L7211 Pilar cyst: Secondary | ICD-10-CM | POA: Diagnosis not present

## 2024-02-01 NOTE — Patient Instructions (Addendum)
 Pre-Operative Instructions You are scheduled for a surgical procedure at [facility]. We recommend you read the following instructions. If you have any questions or concerns, please call the office at [phone].  Shower and wash the entire body with soap and water the day of your surgery paying special attention to cleansing at and around the planned surgery site.  Please continue to take your anticoagulants (blood thinners) as you normally     would before and after surgery if they were prescribed by a medical provider. Stopping them could be harmful to you. We have multiple tools in dermatology to stop the bleeding even if you take an anticoagulant. If you take over the counter blood thinner such as aspirin, Ibuprofen (Motrin, Advil and Nuprin), Naprosyn , Voltaren , Relafen, etc. that was not prescribed or recommended by a medical provider, we recommend that you stop taking it for a week before your surgery and wait to restart until 2 days after your surgery.  Please inform us  of all medications you are currently taking. All medications that are taken regularly should be taken the day of surgery as you always do. Nevertheless, we need to be informed of what medications you are taking prior to surgery to know whether they will affect the procedure or cause any complications.   Please inform us  of any medication allergies. Also inform us  of whether you have allergies to Latex or rubber products or whether you have had any adverse reaction to Lidocaine  or Epinephrine .  Please inform us  of any prosthetic or artificial body parts such as artificial heart valve, joint replacements, etc., or similar condition that might require preoperative antibiotics.   We recommend avoidance of alcohol at least two weeks prior to surgery and continued avoidance for at least two weeks after surgery.   We recommend discontinuation of tobacco smoking at least two weeks prior to surgery and continued  abstinence for at least two weeks after surgery.  Do not plan strenuous exercise, strenuous work or strenuous lifting for approximately four weeks after your surgery.   We request if you are unable to make your scheduled surgical appointment, please call us  at least a week in advance or as soon as you are aware of a problem so that we can cancel or reschedule the appointment.   You MAY TAKE TYLENOL  (acetaminophen ) for pain as it is not a blood thinner.   PLEASE PLAN TO BE IN TOWN FOR TWO WEEKS FOLLOWING SURGERY, THIS IS IMPORTANT SO YOU CAN BE CHECKED FOR DRESSING CHANGES, FUTURE REMOVAL AND TO MONITOR FOR POSSIBLE COMPLICATIONS.   Due to recent changes in healthcare laws, you may see results of your pathology and/or laboratory studies on MyChart before the doctors have had a chance to review them. We understand that in some cases there may be results that are confusing or concerning to you. Please understand that not all results are received at the same time and often the doctors may need to interpret multiple results in order to provide you with the best plan of care or course of treatment. Therefore, we ask that you please give us  2 business days to thoroughly review all your results before contacting the office for clarification. Should we see a critical lab result, you will be contacted sooner.   If You Need Anything After Your Visit  If you have any questions or concerns for your doctor, please call our main line at [phone] and press option 4 to reach your doctor's medical assistant. If  no one answers, please leave a voicemail as directed and we will return your call as soon as possible. Messages left after 4 pm will be answered the following business day.   You may also send us  a message via MyChart. We typically respond to MyChart messages within 1-2 business days.  For prescription refills, please ask your pharmacy to contact our office. Our fax number is [phone].  If you have  an urgent issue when the clinic is closed that cannot wait until the next business day, you can page your doctor at the number below.    Please note that while we do our best to be available for urgent issues outside of office hours, we are not available 24/7.   If you have an urgent issue and are unable to reach us , you may choose to seek medical care at your doctor's office, retail clinic, urgent care center, or emergency room.  If you have a medical emergency, please immediately call 911 or go to the emergency department.  Pager Numbers  - Dr. Hester: [phone]  - Dr. Jackquline: [phone]  - Dr. Claudene: [phone]   - Dr. Raymund: [phone]  In the event of inclement weather, please call our main line at [phone] for an update on the status of any delays or closures.  Dermatology Medication Tips: Please keep the boxes that topical medications come in in order to help keep track of the instructions about where and how to use these. Pharmacies typically print the medication instructions only on the boxes and not directly on the medication tubes.   If your medication is too expensive, please contact our office at [phone] option 4 or send us  a message through MyChart.   We are unable to tell what your co-pay for medications will be in advance as this is different depending on your insurance coverage. However, we may be able to find a substitute medication at lower cost or fill out paperwork to get insurance to cover a needed medication.   If a prior authorization is required to get your medication covered by your insurance company, please allow us  1-2 business days to complete this process.  Drug prices often vary depending on where the prescription is filled and some pharmacies may offer cheaper prices.  The website www.goodrx.com contains coupons for medications through different pharmacies. The prices here do not account for what the cost may be with help from  insurance (it may be cheaper with your insurance), but the website can give you the price if you did not use any insurance.  - You can print the associated coupon and take it with your prescription to the pharmacy.  - You may also stop by our office during regular business hours and pick up a GoodRx coupon card.  - If you need your prescription sent electronically to a different pharmacy, notify our office through [facility] or by phone at [phone] option 4.     Si Usted Necesita Algo Despus de Su Visita  Tambin puede enviarnos un mensaje a travs de Clinical cytogeneticist. Por lo general respondemos a los mensajes de MyChart en el transcurso de 1 a 2 das hbiles.  Para renovar recetas, por favor pida a su farmacia que se ponga en contacto con nuestra oficina. Randi lakes de fax es [address] [phone].  Si tiene un asunto urgente cuando la clnica est cerrada y que no puede esperar hasta el siguiente da hbil, puede llamar/localizar a su doctor(a) al nmero que aparece a continuacin.  Por favor, tenga en cuenta que aunque hacemos todo lo posible para estar disponibles para asuntos urgentes fuera del horario de [place], no estamos disponibles las 24 horas del da, los 7 [address]  [address] de la [place].   Si tiene un problema urgente y no puede comunicarse con nosotros, puede optar por buscar atencin mdica  en el consultorio de su doctor(a), en una clnica privada, en un centro de atencin urgente o en una sala de emergencias.  Si tiene Engineer, drilling, por favor llame inmediatamente al 911 o vaya a la sala de emergencias.  Nmeros de bper  - Dr. Hester: [phone]  - Dra. Jackquline: [phone]  - Dr. Claudene: [phone]  - Dra. Kitts: [phone]  En caso de inclemencias del [place], por favor llame a nuestra lnea principal al [phone] para una actualizacin sobre el estado de cualquier retraso o cierre.  Consejos para la medicacin en dermatologa: Por favor, guarde las  cajas en las que vienen los medicamentos de uso tpico para ayudarle a seguir las instrucciones sobre dnde y cmo usarlos. Las farmacias generalmente imprimen las instrucciones del medicamento slo en las cajas y no directamente en los tubos del [place].   Si su medicamento es muy caro, por favor, pngase en contacto con landry rieger llamando al [phone] y presione la opcin 4 o envenos un mensaje a travs de Clinical cytogeneticist.   No podemos decirle cul ser su copago por los medicamentos por adelantado ya que esto es diferente dependiendo de la cobertura de su seguro. Sin embargo, es posible que podamos encontrar un medicamento sustituto a Audiological scientist un formulario para que el seguro cubra el medicamento que se considera necesario.   Si se requiere una autorizacin previa para que su compaa de seguros malta su medicamento, por favor permtanos de 1 a 2 das hbiles para completar este proceso.  Los precios de los medicamentos varan con frecuencia dependiendo del Environmental consultant de dnde se surte la receta y alguna farmacias pueden ofrecer precios ms baratos.  El sitio web www.goodrx.com tiene cupones para medicamentos de Health and safety inspector. Los precios aqu no tienen en cuenta lo que podra costar con la ayuda del seguro (puede ser ms barato con su seguro), pero el sitio web puede darle el precio si no utiliz Tourist information centre manager.  - Puede imprimir el cupn correspondiente y llevarlo con su receta a la farmacia.  - Tambin puede pasar por nuestra oficina durante el horario de atencin regular y Education officer, museum una tarjeta de cupones de GoodRx.  - Si necesita que su receta se enve electrnicamente a una farmacia diferente, informe a nuestra oficina a travs de MyChart de [place] o por telfono llamando al [phone] y presione la opcin 4.

## 2024-02-01 NOTE — Progress Notes (Signed)
    Subjective   Heather Hardin is a 48 y.o. female who presents for the following: Lesion(s) of concern . Patient is new patient  Today patient reports: Lump / cyst at left back of scalp, reports has grown  Also reports a spot at right lower leg that she noticed back in June  Bruise at right lower leg   Review of Systems:    No other skin or systemic complaints except as noted in HPI or Assessment and Plan.  The following portions of the chart were reviewed this encounter and updated as appropriate: medications, allergies, medical history  Relevant Medical History:  Reviewed   Objective  Well appearing patient in no apparent distress; mood and affect are within normal limits. Examination was performed of the: Focused Exam of: scalp and right lower leg, left lower extremity, face and chest   - 6-20 mm pigmented macules that are tan to brown in color and are somewhat non-uniform in shape and concentrated in the sun-exposed areas - Multiple stuck-on brown, tan and grey papillated papules and plaques on upper extremities  - Actinic Damage/Elastosis: chronic sun damage: dyspigmentation, telangiectasia, and wrinkling Faint pink macule RLE  Left parietal scalp 1 cm mobile cyst  Examination notable for: ---  Examination limited by: Clothing     Assessment & Plan   SKIN CANCER SCREENING PERFORMED TODAY.  BENIGN SKIN FINDINGS  - Lentigines  - Seborrheic keratoses - Reassurance provided regarding the benign appearance of lesions noted on exam today; no treatment is indicated in the absence of symptoms/changes. - Reinforced importance of photoprotective strategies including liberal and frequent sunscreen use of a broad-spectrum SPF 30 or greater, use of protective clothing, and sun avoidance for prevention of cutaneous malignancy and photoaging.  Counseled patient on the importance of regular self-skin monitoring as well as routine clinical skin examinations as scheduled.   ACTINIC  DAMAGE - Chronic condition, secondary to cumulative UV/sun exposure - Recommend daily broad spectrum sunscreen SPF 30+ to sun-exposed areas, reapply every 2 hours as needed.  - Staying in the shade or wearing long sleeves, sun glasses (UVA+UVB protection) and wide brim hats (4-inch brim around the entire circumference of the hat) are also recommended for sun protection.  - Call for new or changing lesions.  Pilar cyst of Left Parietal scalp x 1,+ pain - Counseled patient on the benign nature of these lesions, but due to pain, will plan to schedule for surgical removal   Procedures, orders, diagnosis for this visit:    There are no diagnoses linked to this encounter.  Return to clinic: No follow-ups on file.  Documentation: I have reviewed the above documentation for accuracy and completeness, and I agree with the above.  Lauraine JAYSON Kanaris, MD

## 2024-02-13 ENCOUNTER — Inpatient Hospital Stay: Attending: Oncology

## 2024-02-13 DIAGNOSIS — D509 Iron deficiency anemia, unspecified: Secondary | ICD-10-CM | POA: Insufficient documentation

## 2024-02-13 DIAGNOSIS — E611 Iron deficiency: Secondary | ICD-10-CM

## 2024-02-13 DIAGNOSIS — D72819 Decreased white blood cell count, unspecified: Secondary | ICD-10-CM | POA: Insufficient documentation

## 2024-02-13 DIAGNOSIS — E538 Deficiency of other specified B group vitamins: Secondary | ICD-10-CM | POA: Diagnosis not present

## 2024-02-13 LAB — CBC WITH DIFFERENTIAL/PLATELET
Abs Immature Granulocytes: 0.01 K/uL (ref 0.00–0.07)
Basophils Absolute: 0 K/uL (ref 0.0–0.1)
Basophils Relative: 1 %
Eosinophils Absolute: 0.1 K/uL (ref 0.0–0.5)
Eosinophils Relative: 2 %
HCT: 43 % (ref 36.0–46.0)
Hemoglobin: 14.1 g/dL (ref 12.0–15.0)
Immature Granulocytes: 0 %
Lymphocytes Relative: 23 %
Lymphs Abs: 1.1 K/uL (ref 0.7–4.0)
MCH: 31.7 pg (ref 26.0–34.0)
MCHC: 32.8 g/dL (ref 30.0–36.0)
MCV: 96.6 fL (ref 80.0–100.0)
Monocytes Absolute: 0.4 K/uL (ref 0.1–1.0)
Monocytes Relative: 9 %
Neutro Abs: 3.1 K/uL (ref 1.7–7.7)
Neutrophils Relative %: 65 %
Platelets: 215 K/uL (ref 150–400)
RBC: 4.45 MIL/uL (ref 3.87–5.11)
RDW: 12.8 % (ref 11.5–15.5)
WBC: 4.8 K/uL (ref 4.0–10.5)
nRBC: 0 % (ref 0.0–0.2)

## 2024-02-13 LAB — COMPREHENSIVE METABOLIC PANEL WITH GFR
ALT: 15 U/L (ref 0–44)
AST: 21 U/L (ref 15–41)
Albumin: 4 g/dL (ref 3.5–5.0)
Alkaline Phosphatase: 87 U/L (ref 38–126)
Anion gap: 9 (ref 5–15)
BUN: 12 mg/dL (ref 6–20)
CO2: 25 mmol/L (ref 22–32)
Calcium: 9.3 mg/dL (ref 8.9–10.3)
Chloride: 104 mmol/L (ref 98–111)
Creatinine, Ser: 0.74 mg/dL (ref 0.44–1.00)
GFR, Estimated: >60 mL/min (ref >60)
Glucose, Bld: 75 mg/dL (ref 70–99)
Potassium: 4.3 mmol/L (ref 3.5–5.1)
Sodium: 137 mmol/L (ref 135–145)
Total Bilirubin: 0.4 mg/dL (ref 0.0–1.2)
Total Protein: 6.3 g/dL — ABNORMAL LOW (ref 6.5–8.1)

## 2024-02-13 LAB — IRON AND TIBC
Iron: 130 ug/dL (ref 28–170)
Saturation Ratios: 47 % — ABNORMAL HIGH (ref 10.4–31.8)
TIBC: 279 ug/dL (ref 250–450)
UIBC: 149 ug/dL

## 2024-02-13 LAB — FOLATE: Folate: 14.4 ng/mL (ref >5.9)

## 2024-02-13 LAB — VITAMIN B12: Vitamin B-12: 1002 pg/mL — ABNORMAL HIGH (ref 180–914)

## 2024-02-13 LAB — FERRITIN: Ferritin: 71 ng/mL (ref 11–307)

## 2024-02-14 DIAGNOSIS — H5213 Myopia, bilateral: Secondary | ICD-10-CM | POA: Diagnosis not present

## 2024-02-20 ENCOUNTER — Ambulatory Visit: Admitting: Oncology

## 2024-02-20 ENCOUNTER — Inpatient Hospital Stay: Admitting: Oncology

## 2024-02-21 NOTE — Progress Notes (Unsigned)
 [facility] at [facility]  HEMATOLOGY FOLLOW-UP VISIT  Glennon Sand, NP (Inactive)  REASON FOR FOLLOW-UP: Iron deficiency anemia  ASSESSMENT & PLAN:  Patient is a 48 y.o. female following for iron deficiency anemia likely secondary to menstrual blood loss  Assessment and Plan Assessment & Plan Iron deficiency anemia Iron levels normalized. Heavy menstrual bleeding controlled with Provera . Normal colonoscopy.  - Continue iron pills every other day for three months. - Discontinue iron pills after three months if iron levels stable. - Follow-up with primary care provider. - Return to clinic if symptoms recur or further iron infusions needed.   The total time spent in the appointment was 20 minutes encounter with patients including review of chart and various tests results, discussions about plan of care and coordination of care plan   All questions were answered. The patient knows to call the clinic with any problems, questions or concerns. No barriers to learning was detected.  Mickiel Dry, MD 10/9/20252:38 PM   SUMMARY OF HEMATOLOGIC HISTORY: Iron deficiency anemia likely secondary to chronic blood loss-menorrhagia -S/p 4 doses of IV Feraheme on 06/30/2023, 07/07/2023,11/23/2023, 11/30/2023   INTERVAL HISTORY: Discussed the use of AI scribe software for clinical note transcription with the patient, who gave verbal consent to proceed.  History of Present Illness Heather Hardin is a 48 year old female with iron deficiency anemia who presents for follow-up after iron infusion therapy.  She is accompanied by her husband today.  She feels significantly better compared to when she first started coming to the clinic, describing the improvement as 'night and day'.  She has been taking iron pills every other day since her last iron infusion in July and plans to continue this regimen for three more months.  Her vitamin B12 supplementation was  discontinued previously, and her B12 levels remain stable. She occasionally takes liquid B12 if she feels sluggish, but this is infrequent.  Her menstrual cycles have been regulated with Provera , which she takes for the first ten days of each month. She still experiences skipped periods occasionally but reports that her periods are no longer heavy like before, which had previously contributed to her iron deficiency. In the past, she experienced heavy periods lasting over two weeks, especially after skipping a couple of months.  She is back at work at [employer] called [employer], located across from the old [employer] site.  I have reviewed the past medical history, past surgical history, social history and family history with the patient   ALLERGIES:  has no known allergies.  MEDICATIONS:  Current Outpatient Medications  Medication Sig Dispense Refill   Acetaminophen  (TYLENOL  PO) Take by mouth.     BIOTIN PO Take by mouth.     escitalopram  (LEXAPRO ) 20 MG tablet Take 1 tablet (20 mg total) by mouth daily. 90 tablet 1   ferrous sulfate 325 (65 FE) MG tablet Take 1 tablet (325 mg total) by mouth daily with breakfast. 30 tablet 5   medroxyPROGESTERone  (PROVERA ) 10 MG tablet 1 tablet daily the first 10 days of each month 30 tablet 3   Multiple Vitamin (MULTIVITAMIN WITH MINERALS) TABS tablet Take 1 tablet by mouth daily.     OVER THE COUNTER MEDICATION Vit b12 5,000 mcg daily (Patient taking differently: Vit b12 5,000 mcg daily prn)     zinc gluconate 50 MG tablet Take 50 mg by mouth daily.     No current facility-administered medications for this visit.  REVIEW OF SYSTEMS:   Constitutional: Denies fevers, chills or night sweats Eyes: Denies blurriness of vision Ears, nose, mouth, throat, and face: Denies mucositis or sore throat Respiratory: Denies cough, dyspnea or wheezes Cardiovascular: Denies palpitation, chest discomfort or lower extremity  swelling Gastrointestinal:  Denies nausea, heartburn or change in bowel habits Skin: Denies abnormal skin rashes Lymphatics: Denies new lymphadenopathy or easy bruising Neurological:Denies numbness, tingling or new weaknesses Behavioral/Psych: Mood is stable, no new changes  All other systems were reviewed with the patient and are negative.  PHYSICAL EXAMINATION:   Vitals:   02/22/24 1023  BP: 126/76  Pulse: 64  Resp: 18  Temp: 99 F (37.2 C)  SpO2: 98%   GENERAL:alert, no distress and comfortable SKIN: skin color, texture, turgor are normal, no rashes or significant lesions LYMPH:  no palpable lymphadenopathy in the cervical, axillary or inguinal LUNGS: clear to auscultation and percussion with normal breathing effort HEART: regular rate & rhythm and no murmurs and no lower extremity edema ABDOMEN:abdomen soft, non-tender and normal bowel sounds Musculoskeletal:no cyanosis of digits and no clubbing  NEURO: alert & oriented x 3 with fluent speech  LABORATORY DATA:  I have reviewed the data as listed  Lab Results  Component Value Date   WBC 4.8 02/13/2024   NEUTROABS 3.1 02/13/2024   HGB 14.1 02/13/2024   HCT 43.0 02/13/2024   MCV 96.6 02/13/2024   PLT 215 02/13/2024       Chemistry      Component Value Date/Time   NA 137 02/13/2024 1001   NA 141 09/11/2017 1653   K 4.3 02/13/2024 1001   CL 104 02/13/2024 1001   CO2 25 02/13/2024 1001   BUN 12 02/13/2024 1001   BUN 9 09/11/2017 1653   CREATININE 0.74 02/13/2024 1001      Component Value Date/Time   CALCIUM 9.3 02/13/2024 1001   ALKPHOS 87 02/13/2024 1001   AST 21 02/13/2024 1001   ALT 15 02/13/2024 1001   BILITOT 0.4 02/13/2024 1001   BILITOT 0.2 09/11/2017 1653      Latest Reference Range & Units 02/13/24 10:02  Iron 28 - 170 ug/dL 869  UIBC ug/dL 850  TIBC 749 - 549 ug/dL 720  Saturation Ratios 10.4 - 31.8 % 47 (H)  Ferritin 11 - 307 ng/mL 71  Folate >5.9 ng/mL 14.4  Vitamin B12 180 - 914 pg/mL  1,002 (H)  (H): Data is abnormally high   Colonoscopy: 07/03/2023 Impression:  - Non- bleeding internal hemorrhoids.  - The examined portion of the ileum was normal.  - The entire examined colon is normal.  - No specimens collected.  Endoscopy: 05/31/2023 Impression:  - Normal esophagus.  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 Gastrojejunal anastomosis characterized by healthy appearing mucosa. Biopsied. - Normal examined jejunum.  Capsule endoscopy: 08/18/23 Summary/Findings: Study complete. No active bleeding or recent bleeding identified. Small linear erosion noted.

## 2024-02-22 ENCOUNTER — Inpatient Hospital Stay: Attending: Oncology | Admitting: Oncology

## 2024-02-22 VITALS — BP 126/76 | HR 64 | Temp 99.0°F | Resp 18 | Wt 191.4 lb

## 2024-02-22 DIAGNOSIS — N920 Excessive and frequent menstruation with regular cycle: Secondary | ICD-10-CM | POA: Insufficient documentation

## 2024-02-22 DIAGNOSIS — Z79899 Other long term (current) drug therapy: Secondary | ICD-10-CM | POA: Insufficient documentation

## 2024-02-22 DIAGNOSIS — D500 Iron deficiency anemia secondary to blood loss (chronic): Secondary | ICD-10-CM | POA: Diagnosis not present

## 2024-02-22 DIAGNOSIS — D509 Iron deficiency anemia, unspecified: Secondary | ICD-10-CM | POA: Insufficient documentation

## 2024-02-27 ENCOUNTER — Ambulatory Visit: Admitting: Obstetrics & Gynecology

## 2024-02-27 ENCOUNTER — Encounter: Payer: Self-pay | Admitting: Obstetrics & Gynecology

## 2024-02-27 VITALS — BP 128/77 | HR 69 | Ht 67.0 in | Wt 192.0 lb

## 2024-02-27 DIAGNOSIS — N924 Excessive bleeding in the premenopausal period: Secondary | ICD-10-CM | POA: Diagnosis not present

## 2024-02-27 MED ORDER — MEDROXYPROGESTERONE ACETATE 10 MG PO TABS: ORAL_TABLET | ORAL | 11 refills | Status: AC

## 2024-02-27 NOTE — Progress Notes (Addendum)
 Follow up appointment for response Cyclical provera  for perimenopausal DUB management  Chief Complaint  Patient presents with   Follow-up    Provera     Blood pressure 128/77, pulse 69, height 5' 7 (1.702 m), weight 192 lb (87.1 kg), last menstrual period 01/23/2024.  Pt is doing well on monthly/cyclical provera     Latest Ref Rng & Units 02/13/2024   10:01 AM 10/31/2023   10:52 AM 07/25/2023    9:31 AM  CBC  WBC 4.0 - 10.5 K/uL 4.8  5.5  3.5   Hemoglobin 12.0 - 15.0 g/dL 85.8  86.0  88.0   Hematocrit 36.0 - 46.0 % 43.0  42.7  40.6   Platelets 150 - 400 K/uL 215  282  235       Latest Reference Range & Units 05/29/23 10:03 05/29/23 21:31 05/30/23 03:50 06/12/23 14:21 06/29/23 09:05 07/25/23 09:31 10/31/23 10:52 02/13/24 10:01  WBC 4.0 - 10.5 K/uL 2.3 (L)  3.2 (L) 5.1 5.0 3.5 (L) 5.5 4.8  RBC 3.87 - 5.11 MIL/uL 2.80 (L)  3.76 (L) 3.88 4.04 4.57 4.52 4.45  Hemoglobin 12.0 - 15.0 g/dL 4.3 (LL) 7.5 (L) 7.4 (L) 8.1 (L) 9.0 (L) 11.9 (L) 13.9 14.1  HCT 36.0 - 46.0 % 18.0 (L) 26.2 (L) 26.6 (L) 29.2 (L) 31.5 (L) 40.6 42.7 43.0  MCV 80.0 - 100.0 fL 64.3 (L)  70.7 (L) 75.3 (L) 78.0 (L) 88.8 94.5 96.6  MCH 26.0 - 34.0 pg 15.4 (L)  19.7 (L) 20.9 (L) 22.3 (L) 26.0 30.8 31.7  MCHC 30.0 - 36.0 g/dL 76.0 (L)  72.1 (L) 72.2 (L) 28.6 (L) 29.3 (L) 32.6 32.8  RDW 11.5 - 15.5 % 20.0 (H)  22.5 (H) 26.2 (H) 24.7 (H) 25.9 (H) 12.7 12.8  Platelets 150 - 400 K/uL 344  266 425 (H) 279 235 282 215  nRBC 0.0 - 0.2 % 0.0  0.0 0.0 0.0 0.0 0.0 0.0  (LL): Data is critically low (L): Data is abnormally low (H): Data is abnormally highmoglobin significantly improved  As you can see above 4.3 05/29/23 ^ 14.1  MEDS ordered this encounter: Meds ordered this encounter  Medications   medroxyPROGESTERone  (PROVERA ) 10 MG tablet    Sig: 1 tablet daily the first 10 days of each month    Dispense:  30 tablet    Refill:  11    30 pills is a 3 month supply    Orders for this encounter: No orders of the defined types  were placed in this encounter.   Impression + Management Plan   ICD-10-CM   1. Perimenopausal menometrorrhagia: Continue monthly cycling, when goes 12 months without bleeding, switch to q 3 months and go additional year before dx of menopause  N92.4    dramatic improvement with cyclical/monthly provera       Follow Up: Return in about 1 year (around 02/26/2025) for Follow up, with Dr Jayne.     All questions were answered.  Past Medical History:  Diagnosis Date   Anemia    in youth ; sometimes iron has been low    Anxiety    Blood transfusion without reported diagnosis    Cataract    Depression    Family history of adverse reaction to anesthesia    reports son is slow to wake    GERD (gastroesophageal reflux disease)    Hypertension    PONV (postoperative nausea and vomiting)    Sleep apnea    uses CPAP occ    Thyroid   disease     Past Surgical History:  Procedure Laterality Date   BIOPSY  05/30/2023   Procedure: BIOPSY;  Surgeon: Eartha Angelia Sieving, MD;  Location: [facility];  Service: Gastroenterology;;   CESAREAN SECTION  1997, 1999, 2004   COLONOSCOPY WITH PROPOFOL  N/A 05/30/2023   Procedure: COLONOSCOPY WITH PROPOFOL ;  Surgeon: Eartha Angelia Sieving, MD;  Location: [facility];  Service: Gastroenterology;  Laterality: N/A;   COLONOSCOPY WITH PROPOFOL  N/A 07/03/2023   Procedure: COLONOSCOPY WITH PROPOFOL ;  Surgeon: Cindie Carlin POUR, DO;  Location: [facility];  Service: Endoscopy;  Laterality: N/A;  830am, asa 3   ESOPHAGOGASTRODUODENOSCOPY (EGD) WITH PROPOFOL  N/A 05/30/2023   Procedure: ESOPHAGOGASTRODUODENOSCOPY (EGD) WITH PROPOFOL ;  Surgeon: Eartha Angelia Sieving, MD;  Location: [facility];  Service: Gastroenterology;  Laterality: N/A;   GASTRIC ROUX-EN-Y N/A 10/23/2017   Procedure: LAPAROSCOPIC ROUX-EN-Y GASTRIC BYPASS WITH UPPER ENDOSCOPY, ERAS Pathway;  Surgeon: Mikell Katz, MD;  Location: [facility];  Service: General;  Laterality: N/A;    GIVENS CAPSULE STUDY N/A 08/09/2023   Procedure: IMAGING PROCEDURE, GI TRACT, INTRALUMINAL, VIA CAPSULE;  Surgeon: Cindie Carlin POUR, DO;  Location: [facility];  Service: Endoscopy;  Laterality: N/A;  830am   TUBAL LIGATION  2004    OB History     Gravida  4   Para  3   Term  2   Preterm  0   AB  1   Living  3      SAB  0   IAB  0   Ectopic  0   Multiple      Live Births              No Known Allergies  Social History   Socioeconomic History   Marital status: Married    Spouse name: Not on file   Number of children: Not on file   Years of education: Not on file   Highest education level: Associate degree: academic program  Occupational History   Not on file  Tobacco Use   Smoking status: Former    Current packs/day: 0.00    Average packs/day: 1 pack/day for 4.0 years (4.0 ttl pk-yrs)    Types: Cigarettes    Start date: 05/17/1991    Quit date: 05/17/1995    Years since quitting: 28.8   Smokeless tobacco: Never  Vaping Use   Vaping status: Never Used  Substance and Sexual Activity   Alcohol use: Yes    Alcohol/week: 0.0 standard drinks of alcohol    Comment: very rare   Drug use: Yes    Frequency: 3.0 times per week    Types: Marijuana    Comment: occ   Sexual activity: Yes    Birth control/protection: Surgical    Comment: tubal  Other Topics Concern   Not on file  Social History Narrative   ** Merged History Encounter **       Social Drivers of Health   Financial Resource Strain: Medium Risk (01/18/2024)   Overall Financial Resource Strain (CARDIA)    Difficulty of Paying Living Expenses: Somewhat hard  Food Insecurity: No Food Insecurity (01/18/2024)   Hunger Vital Sign    Worried About Running Out of Food in the Last Year: Never true    Ran Out of Food in the Last Year: Never true  Transportation Needs: No Transportation Needs (01/18/2024)   PRAPARE - Administrator, Civil Service (Medical): No    Lack of Transportation  (  Non-Medical): No  Physical Activity: Sufficiently Active (01/18/2024)   Exercise Vital Sign    Days of Exercise per Week: 7 days    Minutes of Exercise per Session: 30 min  Stress: Stress Concern Present (01/18/2024)   [employer] of Occupational Health - Occupational Stress Questionnaire    Feeling of Stress: Rather much  Social Connections: Socially Isolated (01/18/2024)   Social Connection and Isolation Panel    Frequency of Communication with Friends and Family: Never    Frequency of Social Gatherings with Friends and Family: Never    Attends Religious Services: Never    Database administrator or Organizations: No    Attends Engineer, structural: Not on file    Marital Status: Married    Family History  Problem Relation Age of Onset   Heart failure Maternal Grandmother    Stroke Maternal Grandmother    Hypertension Maternal Grandmother    Osteoarthritis Maternal Grandmother    Heart failure Maternal Grandfather    Cancer Father        throat   Hypertension Mother    COPD Mother    HIV Mother    Drug abuse Mother    Hepatitis C Mother    Diabetes Sister    Miscarriages / Stillbirths Sister    Obesity Sister

## 2024-02-28 ENCOUNTER — Encounter (INDEPENDENT_AMBULATORY_CARE_PROVIDER_SITE_OTHER): Payer: Self-pay | Admitting: Gastroenterology

## 2024-02-29 ENCOUNTER — Ambulatory Visit: Admitting: Nurse Practitioner

## 2024-03-12 ENCOUNTER — Telehealth: Payer: Self-pay

## 2024-03-12 ENCOUNTER — Ambulatory Visit (INDEPENDENT_AMBULATORY_CARE_PROVIDER_SITE_OTHER)

## 2024-03-12 DIAGNOSIS — L7211 Pilar cyst: Secondary | ICD-10-CM

## 2024-03-12 NOTE — Patient Instructions (Signed)

## 2024-03-12 NOTE — Telephone Encounter (Signed)
 Called patient to follow up from surgery this morning. Patient states she is doing well and denied questions or concerns.

## 2024-03-12 NOTE — Progress Notes (Signed)
 Subjective   Heather Hardin is a 48 y.o. 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  Pacemaker/Defibrillator: Denies  Allergies: Denies  Anticoagulants/Aspirin: Denies  Heart valves: Denies  Joint replacements:  Denies    . Patient is established patient   Today patient reports: Surgical excision of pilar cyst.   Review of Systems:    No other skin or systemic complaints except as noted in HPI or Assessment and Plan.  The following portions of the chart were reviewed this encounter and updated as appropriate: medications, allergies, medical history  Relevant Medical History:  n/a   Objective  Well appearing patient in no apparent distress; mood and affect are within normal limits. Examination was performed of the: Focused Exam of: Left parietal scalp   Examination notable for: - A firm, skin-colored, subcutaneous nodule that is freely movable and has a central punctum located at the left parietal scalp.    Left Parietal Scalp 1.0 cm firm SQ nodule.  Assessment & Plan    Procedures, orders, diagnosis for this visit:  PILAR CYST Left Parietal Scalp Skin excision - Left Parietal Scalp  Excision method:  elliptical Lesion length (cm):  1 Lesion width (cm):  1 Total excision diameter (cm):  0 Informed consent: discussed and consent obtained   Timeout: patient name, date of birth, surgical site, and procedure verified   Procedure prep:  Patient was prepped and draped in usual sterile fashion Prep type:  Chlorhexidine  Anesthesia: the lesion was anesthetized in a standard fashion   Anesthetic:  1% lidocaine  w/ epinephrine  1-100,000 buffered w/ 8.4% NaHCO3 (6 cc) Instrument used: #15 blade   Hemostasis achieved with: suture, pressure and electrodesiccation   Outcome: patient tolerated procedure well with no complications    Skin repair - Left Parietal Scalp Complexity:  Intermediate Final length (cm):   1 Informed consent: discussed and consent obtained   Timeout: patient name, date of birth, surgical site, and procedure verified   Procedure prep:  Patient was prepped and draped in usual sterile fashion Prep type:  Chlorhexidine  Anesthesia: the lesion was anesthetized in a standard fashion   Anesthetic:  1% lidocaine  w/ epinephrine  1-100,000 buffered w/ 8.4% NaHCO3 Reason for type of repair: reduce tension to allow closure, reduce the risk of dehiscence, infection, and necrosis, reduce subcutaneous dead space and avoid a hematoma, allow closure of the large defect and preserve normal anatomy   Undermining: edges could be approximated without difficulty   Subcutaneous layers (deep stitches):  Suture size:  4-0 Suture type: Monocryl (poliglecaprone 25)   Stitches:  Buried vertical mattress Fine/surface layer approximation (top stitches):  Suture size:  5-0 Suture type: Prolene (polypropylene)   Stitches: simple running   Suture removal (days):  7 Hemostasis achieved with: suture, pressure and electrodesiccation Outcome: patient tolerated procedure well with no complications   Post-procedure details: sterile dressing applied and wound care instructions given   Dressing type: petrolatum, bandage and pressure dressing    Specimen 1 - Surgical pathology Differential Diagnosis: Pilar cyst vs other  Check Margins: Yes  Pilar cyst -     Skin excision -     Skin repair -     Surgical pathology; Standing   Return to clinic: Return for suture removal in 7-10 days.  I, Emerick Ege, CMA am acting as scribe for Lauraine JAYSON Kanaris, MD.   Documentation: I have reviewed the above documentation for accuracy and completeness, and I agree with  the above.  Lauraine JAYSON Kanaris, MD

## 2024-03-14 LAB — SURGICAL PATHOLOGY

## 2024-03-18 ENCOUNTER — Ambulatory Visit: Payer: Self-pay

## 2024-03-18 NOTE — Progress Notes (Signed)
 LMTRC

## 2024-03-20 NOTE — Telephone Encounter (Signed)
 Patient informed of pathology results

## 2024-03-20 NOTE — Telephone Encounter (Signed)
-----   Message from Lauraine JAYSON Kanaris sent at 03/18/2024 10:45 AM EST -----    1. Skin (M), left parietal scalp :       PILAR CYST  Please notify patient with below plan: Benign, observe.   ----- Message ----- From: Interface, Lab In Three [facility] One Sent: 03/14/2024   5:27 PM EST To: Lauraine JAYSON Kanaris, MD

## 2024-03-21 ENCOUNTER — Ambulatory Visit

## 2024-03-21 DIAGNOSIS — Z4802 Encounter for removal of sutures: Secondary | ICD-10-CM

## 2024-03-21 DIAGNOSIS — L7211 Pilar cyst: Secondary | ICD-10-CM

## 2024-03-21 NOTE — Progress Notes (Signed)
   Follow-Up Visit   Subjective  Heather Hardin is a 48 y.o. female who presents for the following: Suture removal  Pathology showed a benign pilar cyst  The following portions of the chart were reviewed this encounter and updated as appropriate: medications, allergies, medical history  Review of Systems:  No other skin or systemic complaints except as noted in HPI or Assessment and Plan.  Objective  Well appearing patient in no apparent distress; mood and affect are within normal limits.  Areas Examined: The scalp Relevant physical exam findings are noted in the Assessment and Plan.    Assessment & Plan    Encounter for Removal of Sutures - Incision site is clean, dry and intact. - Wound cleansed, sutures removed, wound cleansed.  - Discussed pathology results showing a benign pilar cyst - Patient advised to keep steri-strips dry until they fall off. - Scars remodel for a full year. - Patient advised to call with any concerns or if they notice any new or changing lesions.  No follow-ups on file.  LILLETTE Rosina Mayans, CMA, am acting as scribe for Lauraine JAYSON Kanaris, MD .  Documentation: I have reviewed the above documentation for accuracy and completeness, and I agree with the above.  Lauraine JAYSON Kanaris, MD

## 2024-03-21 NOTE — Patient Instructions (Signed)

## 2024-04-01 DIAGNOSIS — I10 Essential (primary) hypertension: Secondary | ICD-10-CM | POA: Diagnosis not present

## 2024-04-01 DIAGNOSIS — E782 Mixed hyperlipidemia: Secondary | ICD-10-CM | POA: Diagnosis not present

## 2024-04-02 LAB — LIPID PANEL
Chol/HDL Ratio: 2.9 ratio (ref 0.0–4.4)
Cholesterol, Total: 194 mg/dL (ref 100–199)
HDL: 68 mg/dL (ref >39)
LDL Chol Calc (NIH): 96 mg/dL (ref 0–99)
Triglycerides: 179 mg/dL — ABNORMAL HIGH (ref 0–149)
VLDL Cholesterol Cal: 30 mg/dL (ref 5–40)

## 2024-04-02 LAB — CMP14+EGFR
ALT: 17 IU/L (ref 0–32)
AST: 23 IU/L (ref 0–40)
Albumin: 4 g/dL (ref 3.9–4.9)
Alkaline Phosphatase: 92 IU/L (ref 41–116)
BUN/Creatinine Ratio: 16 (ref 9–23)
BUN: 13 mg/dL (ref 6–24)
Bilirubin Total: <0.2 mg/dL (ref 0.0–1.2)
CO2: 26 mmol/L (ref 20–29)
Calcium: 9.5 mg/dL (ref 8.7–10.2)
Chloride: 103 mmol/L (ref 96–106)
Creatinine, Ser: 0.79 mg/dL (ref 0.57–1.00)
Globulin, Total: 2.3 g/dL (ref 1.5–4.5)
Glucose: 85 mg/dL (ref 70–99)
Potassium: 4.5 mmol/L (ref 3.5–5.2)
Sodium: 139 mmol/L (ref 134–144)
Total Protein: 6.3 g/dL (ref 6.0–8.5)
eGFR: 92 mL/min/1.73 (ref >59)

## 2024-04-02 LAB — TSH+FREE T4
Free T4: 0.99 ng/dL (ref 0.82–1.77)
TSH: 2.45 u[IU]/mL (ref 0.450–4.500)

## 2024-04-04 ENCOUNTER — Ambulatory Visit (INDEPENDENT_AMBULATORY_CARE_PROVIDER_SITE_OTHER): Payer: Self-pay

## 2024-04-04 DIAGNOSIS — F411 Generalized anxiety disorder: Secondary | ICD-10-CM | POA: Diagnosis not present

## 2024-04-04 NOTE — Progress Notes (Signed)
 Established Patient Office Visit  Subjective   Patient ID: Heather Hardin, female    DOB: 1975/12/17  Age: 48 y.o. MRN: [id number]  Chief Complaint  Patient presents with   Medical Management of Chronic Issues    Lexapro  medication management     HPI   Patient Active Problem List   Diagnosis Date Noted   Perimenopausal menometrorrhagia: Continue monthly cycling, when goes 12 months without bleeding, switch to q 3 months and go additional year before dx of menopause 02/27/2024   Lesion of skin of scalp 01/18/2024   Subareolar mass of right breast 01/08/2024   Breast pain, right 01/08/2024   Leukopenia 08/02/2023   Vitamin B12 deficiency 06/20/2023   IDA (iron deficiency anemia) 06/07/2023   DOE (dyspnea on exertion) 06/07/2023   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   Symptomatic anemia 05/29/2023   Hyperlipidemia 09/12/2017   Morbid obesity (HCC) 06/12/2017   Insomnia 03/07/2017   OSA (obstructive sleep apnea) 03/07/2017   Menorrhagia with regular cycle 09/28/2016   Dysmenorrhea 09/28/2016   Metabolic syndrome 01/21/2016   Hypertriglyceridemia 06/18/2015   Depression 03/11/2015   GAD (generalized anxiety disorder) 03/11/2015   Essential hypertension 09/08/2014   Hypothyroidism 08/25/2014   Vitamin D  deficiency 08/25/2014    ROS    Objective:     BP 133/79   Pulse 68   Ht 5' 7 (1.702 m)   Wt 199 lb 1.3 oz (90.3 kg)   SpO2 98%   BMI 31.18 kg/m  BP Readings from Last 3 Encounters:  04/04/24 133/79  02/27/24 128/77  02/22/24 126/76   Wt Readings from Last 3 Encounters:  04/04/24 199 lb 1.3 oz (90.3 kg)  02/27/24 192 lb (87.1 kg)  02/22/24 191 lb 6.4 oz (86.8 kg)     Physical Exam Vitals and nursing note reviewed.  Constitutional:      Appearance: Normal appearance.  HENT:     Head: Normocephalic.  Eyes:     Extraocular Movements: Extraocular movements intact.     Pupils: Pupils are equal, round, and reactive to light.   Cardiovascular:     Rate and Rhythm: Normal rate and regular rhythm.  Pulmonary:     Effort: Pulmonary effort is normal.     Breath sounds: Normal breath sounds.  Musculoskeletal:     Cervical back: Normal range of motion and neck supple.  Neurological:     Mental Status: She is alert and oriented to person, place, and time.  Psychiatric:        Mood and Affect: Mood normal.        Thought Content: Thought content normal.      No results found for any visits on 04/04/24.  Last CBC Lab Results  Component Value Date   WBC 4.8 02/13/2024   HGB 14.1 02/13/2024   HCT 43.0 02/13/2024   MCV 96.6 02/13/2024   MCH 31.7 02/13/2024   RDW 12.8 02/13/2024   PLT 215 02/13/2024   Last metabolic panel Lab Results  Component Value Date   GLUCOSE 85 04/01/2024   NA 139 04/01/2024   K 4.5 04/01/2024   CL 103 04/01/2024   CO2 26 04/01/2024   BUN 13 04/01/2024   CREATININE 0.79 04/01/2024   EGFR 92 04/01/2024   CALCIUM 9.5 04/01/2024   PROT 6.3 04/01/2024   ALBUMIN 4.0 04/01/2024   LABGLOB 2.3 04/01/2024   AGRATIO 1.4 09/11/2017   BILITOT <0.2 04/01/2024   ALKPHOS 92 04/01/2024   AST 23 04/01/2024  ALT 17 04/01/2024   ANIONGAP 9 02/13/2024   Last lipids Lab Results  Component Value Date   CHOL 194 04/01/2024   HDL 68 04/01/2024   LDLCALC 96 04/01/2024   TRIG 179 (H) 04/01/2024   CHOLHDL 2.9 04/01/2024   Last hemoglobin A1c No results found for: HGBA1C Last thyroid  functions Lab Results  Component Value Date   TSH 2.450 04/01/2024   T4TOTAL 7.8 01/21/2016   FREET4 0.99 04/01/2024   Last vitamin D  Lab Results  Component Value Date   VD25OH 26.0 (L) 09/11/2017   Last vitamin B12 and Folate Lab Results  Component Value Date   VITAMINB12 1,002 (H) 02/13/2024   FOLATE 14.4 02/13/2024      The 10-year ASCVD risk score (Arnett DK, et al., 2019) is: 0.9%    Assessment & Plan:   Problem List Items Addressed This Visit       Other   GAD (generalized  anxiety disorder)   Stable with current dose of Lexapro  20 mg.  No medication changes made today.  Continue with current dose.        No follow-ups on file.    Leita Longs, FNP

## 2024-04-07 NOTE — Assessment & Plan Note (Signed)
 Stable with current dose of Lexapro  20 mg.  No medication changes made today.  Continue with current dose.

## 2024-04-26 DIAGNOSIS — Z419 Encounter for procedure for purposes other than remedying health state, unspecified: Secondary | ICD-10-CM | POA: Diagnosis not present

## 2024-05-24 ENCOUNTER — Encounter (HOSPITAL_COMMUNITY): Payer: Self-pay | Admitting: *Deleted

## 2024-06-19 ENCOUNTER — Ambulatory Visit: Payer: Self-pay

## 2024-06-19 NOTE — Telephone Encounter (Signed)
 FYI Only or Action Required?: Action required by provider: request for appointment.  Patient was last seen in primary care on 04/04/2024 by Bevely Doffing, FNP.  Called Nurse Triage reporting [facility].  Symptoms began x 2 months.  Interventions attempted: Nothing.  Symptoms are: stable.  Triage Disposition: See PCP When Office is Open (Within 3 Days)  Patient/caregiver understands and will follow disposition?: Yes    Message from Deer D sent at 06/19/2024  2:54 PM EST  Reason for Triage: Something Petruting on the left side below the rib cage pops in and out  the lump is pretty firm when it pops out slight tenderness.   Reason for Disposition  [1] Small swelling or lump AND [2] unexplained AND [3] present > 1 week  Answer Assessment - Initial Assessment Questions 1. APPEARANCE: What does it look like?     She stated she is only aware of it a certain times. It is intermittent, happens with strain.   2. SIZE: How large is the swelling? (e.g., inches, cm; or compare to size of pinhead, tip of pen, eraser, coin, pea, grape, ping pong ball)      2 knuckles in size  3. LOCATION: Where is the swelling located?     Left side rib cage   4. ONSET: When did the lump start?     X 2 months  5. COLOR: What color is it? Is there more than one color?     Normal skin tone   6. PAIN: Is there any pain? If Yes, ask: How bad is the pain? (Scale 1-10; or mild, moderate, severe)       No   7. ITCH: Does it itch? If Yes, ask: How bad is the itch?      No   8. CAUSE: Unsure, but does heavy lifting at work       9 OTHER SYMPTOMS: Do you have any other symptoms? (e.g., fever)      Not related, but has has 2 menstrual cycles this month.      Patient called in to triage with complaints of lump on left side of rib cage.  This has been ongoing for 2 month The patient stated it is firm, with tenderness noted.   For home care, the patient is taking   Appointment  offered for further evaluation; however per Epic April is the soonest for any provider. Routing high priority to [facility] as patient is requesting a sooner appointment.  Protocols used: Skin Lump or Localized Swelling-A-AH

## 2024-06-19 NOTE — Telephone Encounter (Signed)
 scheduled

## 2024-07-17 ENCOUNTER — Ambulatory Visit: Payer: Self-pay | Admitting: Nurse Practitioner

## 2024-10-08 ENCOUNTER — Ambulatory Visit
